# Patient Record
Sex: Male | Born: 1960 | Race: White | Hispanic: No | Marital: Single | Smoking: Never smoker
Health system: Southern US, Community
[De-identification: ages and names within clinical notes are randomized; demographics above are authoritative.]

## PROBLEM LIST (undated history)

## (undated) DIAGNOSIS — I10 Essential (primary) hypertension: Secondary | ICD-10-CM

## (undated) DIAGNOSIS — E119 Type 2 diabetes mellitus without complications: Secondary | ICD-10-CM

---

## 1998-04-26 ENCOUNTER — Encounter: Admission: RE | Admit: 1998-04-26 | Discharge: 1998-04-26 | Payer: Self-pay | Admitting: *Deleted

## 2006-09-11 ENCOUNTER — Ambulatory Visit: Payer: Self-pay | Admitting: Family Medicine

## 2010-01-16 ENCOUNTER — Emergency Department: Payer: Self-pay | Admitting: Emergency Medicine

## 2010-11-27 ENCOUNTER — Emergency Department: Payer: Self-pay | Admitting: Emergency Medicine

## 2011-01-18 ENCOUNTER — Ambulatory Visit: Payer: Self-pay | Admitting: Specialist

## 2011-07-27 ENCOUNTER — Emergency Department: Payer: Self-pay | Admitting: Emergency Medicine

## 2011-07-27 LAB — COMPREHENSIVE METABOLIC PANEL
Alkaline Phosphatase: 83 U/L (ref 50–136)
Anion Gap: 8 (ref 7–16)
BUN: 17 mg/dL (ref 7–18)
Bilirubin,Total: 0.6 mg/dL (ref 0.2–1.0)
Calcium, Total: 8.3 mg/dL — ABNORMAL LOW (ref 8.5–10.1)
Chloride: 104 mmol/L (ref 98–107)
Co2: 27 mmol/L (ref 21–32)
Creatinine: 1.27 mg/dL (ref 0.60–1.30)
Osmolality: 280 (ref 275–301)
SGPT (ALT): 31 U/L
Sodium: 139 mmol/L (ref 136–145)
Total Protein: 7.7 g/dL (ref 6.4–8.2)

## 2011-07-27 LAB — URINALYSIS, COMPLETE
Bacteria: NONE SEEN
Bilirubin,UR: NEGATIVE
Ketone: NEGATIVE
Specific Gravity: 1.027 (ref 1.003–1.030)

## 2011-07-27 LAB — CBC
MCH: 29.1 pg (ref 26.0–34.0)
MCV: 88 fL (ref 80–100)
Platelet: 240 10*3/uL (ref 150–440)
RBC: 5.46 10*6/uL (ref 4.40–5.90)
RDW: 13.5 % (ref 11.5–14.5)
WBC: 6.6 10*3/uL (ref 3.8–10.6)

## 2012-05-19 ENCOUNTER — Emergency Department: Payer: Self-pay | Admitting: Emergency Medicine

## 2015-03-15 ENCOUNTER — Emergency Department: Payer: BLUE CROSS/BLUE SHIELD

## 2015-03-15 ENCOUNTER — Emergency Department
Admission: EM | Admit: 2015-03-15 | Discharge: 2015-03-15 | Disposition: A | Discharge status: Home / Self Care | Payer: BLUE CROSS/BLUE SHIELD | Attending: Emergency Medicine | Admitting: Emergency Medicine

## 2015-03-15 DIAGNOSIS — R109 Unspecified abdominal pain: Secondary | ICD-10-CM

## 2015-03-15 DIAGNOSIS — R112 Nausea with vomiting, unspecified: Secondary | ICD-10-CM | POA: Insufficient documentation

## 2015-03-15 LAB — COMPREHENSIVE METABOLIC PANEL
ALT: 31 U/L (ref 17–63)
ANION GAP: 5 (ref 5–15)
AST: 21 U/L (ref 15–41)
Albumin: 3.8 g/dL (ref 3.5–5.0)
Alkaline Phosphatase: 93 U/L (ref 38–126)
BILIRUBIN TOTAL: 0.3 mg/dL (ref 0.3–1.2)
BUN: 20 mg/dL (ref 6–20)
CO2: 28 mmol/L (ref 22–32)
Calcium: 8.9 mg/dL (ref 8.9–10.3)
Chloride: 100 mmol/L — ABNORMAL LOW (ref 101–111)
Creatinine, Ser: 1.06 mg/dL (ref 0.61–1.24)
GFR calc Af Amer: >60 mL/min (ref >60)
Glucose, Bld: 322 mg/dL — ABNORMAL HIGH (ref 65–99)
POTASSIUM: 4.5 mmol/L (ref 3.5–5.1)
Sodium: 133 mmol/L — ABNORMAL LOW (ref 135–145)
TOTAL PROTEIN: 7.9 g/dL (ref 6.5–8.1)

## 2015-03-15 LAB — URINALYSIS COMPLETE WITH MICROSCOPIC (ARMC ONLY)
BILIRUBIN URINE: NEGATIVE
Bacteria, UA: NONE SEEN
Ketones, ur: NEGATIVE mg/dL
LEUKOCYTES UA: NEGATIVE
Nitrite: NEGATIVE
PH: 5 (ref 5.0–8.0)
Protein, ur: NEGATIVE mg/dL
Specific Gravity, Urine: 1.029 (ref 1.005–1.030)
WBC, UA: NONE SEEN WBC/hpf (ref 0–5)

## 2015-03-15 LAB — CBC WITH DIFFERENTIAL/PLATELET
BASOS PCT: 1 %
Basophils Absolute: 0.1 10*3/uL (ref 0–0.1)
Eosinophils Absolute: 0.5 10*3/uL (ref 0–0.7)
Eosinophils Relative: 4 %
HEMATOCRIT: 49.9 % (ref 40.0–52.0)
Hemoglobin: 16.7 g/dL (ref 13.0–18.0)
Lymphocytes Relative: 21 %
Lymphs Abs: 2.4 10*3/uL (ref 1.0–3.6)
MCH: 29.6 pg (ref 26.0–34.0)
MCHC: 33.5 g/dL (ref 32.0–36.0)
MCV: 88.4 fL (ref 80.0–100.0)
MONO ABS: 0.8 10*3/uL (ref 0.2–1.0)
Monocytes Relative: 7 %
Neutro Abs: 7.6 10*3/uL — ABNORMAL HIGH (ref 1.4–6.5)
Neutrophils Relative %: 67 %
Platelets: 291 10*3/uL (ref 150–440)
RBC: 5.64 MIL/uL (ref 4.40–5.90)
RDW: 13 % (ref 11.5–14.5)
WBC: 11.4 10*3/uL — ABNORMAL HIGH (ref 3.8–10.6)

## 2015-03-15 LAB — GLUCOSE, CAPILLARY: GLUCOSE-CAPILLARY: 254 mg/dL — AB (ref 65–99)

## 2015-03-15 MED ORDER — SODIUM CHLORIDE 0.9 % IV BOLUS (SEPSIS)
1000.0000 mL | Freq: Once | INTRAVENOUS | Status: AC
  Administered 2015-03-15: 1000 mL via INTRAVENOUS

## 2015-03-15 MED ORDER — ONDANSETRON HCL 4 MG/2ML IJ SOLN
4.0000 mg | Freq: Once | INTRAMUSCULAR | Status: AC
  Administered 2015-03-15: 4 mg via INTRAVENOUS
  Filled 2015-03-15: qty 2

## 2015-03-15 MED ORDER — MORPHINE SULFATE (PF) 4 MG/ML IV SOLN
4.0000 mg | Freq: Once | INTRAVENOUS | Status: AC
  Administered 2015-03-15: 4 mg via INTRAVENOUS
  Filled 2015-03-15: qty 1

## 2015-03-15 MED ORDER — KETOROLAC TROMETHAMINE 30 MG/ML IJ SOLN
30.0000 mg | Freq: Once | INTRAMUSCULAR | Status: AC
  Administered 2015-03-15: 30 mg via INTRAVENOUS
  Filled 2015-03-15: qty 1

## 2015-03-15 NOTE — ED Notes (Signed)
Pt in with co left flank pain x 2 days, hx of kidney stones.

## 2015-03-15 NOTE — ED Notes (Signed)
Resumed care from [name]. Pt alert & oriented, resting comfortably. Fluids completed.

## 2015-03-15 NOTE — ED Provider Notes (Signed)
[facility] Emergency Department Provider Note  ____________________________________________  Time seen: Approximately 551 AM  I have reviewed the triage vital signs and the nursing notes.   HISTORY  Chief Complaint Flank Pain    HPI Gavin CornwallBarry D Dorsey is a 54 y.o. male who comes in thinking is a kidney stone. The patient reports that he has had kidney stones twice before. The patient reports he started having some pain on Sunday morning. It was a low dull ache but has been building. The patient has been trying to let it pass and he has passed his previous stones. The patient reports he thinks he passed a small stone yesterday. The pain is been on the left side and seems to wrap around to the front. The patient reports it is just hurting. He has been nauseous with some vomiting and couldn't tolerate the pain anymore. The patient's pain ranges from a 7-9 out of 10 in intensity. The patient denies blood in his urine denies any chest pain, headache, blurred vision. The patient comes in for evaluation. He reports that this does feel like his kidney stones in the past. He has been taking Aleve for the pain. Has not been helping.   Past medical history Kidney stones  There are no active problems to display for this patient.   Past surgical history Carpal tunnel left hand  No current outpatient prescriptions   Allergies Review of patient's allergies indicates no known allergies.  No family history on file.  Social History Social History  Substance Use Topics  . Smoking status:  nonsmoker   . Smokeless tobacco: Not on file  . Alcohol Use:  occasionally drinks     Review of Systems Constitutional: No fever/chills Eyes: No visual changes. ENT: No sore throat. Cardiovascular: Denies chest pain. Respiratory: Denies shortness of breath. Gastrointestinal: Left-sided pain with nausea and vomiting. Genitourinary: Negative for dysuria. Musculoskeletal: Left flank  pain Skin: Negative for rash. Neurological: Negative for headaches, focal weakness or numbness.  10-point ROS otherwise negative.  ____________________________________________   PHYSICAL EXAM:  VITAL SIGNS: ED Triage Vitals  Enc Vitals Group     BP 03/15/15 0433 167/107 mmHg     Pulse Rate 03/15/15 0433 87     Resp 03/15/15 0433 18     Temp 03/15/15 0431 97.8 F (36.6 C)     Temp Source 03/15/15 0431 Oral     SpO2 03/15/15 0433 95 %     Weight 03/15/15 0431 300 lb (136.079 kg)     Height 03/15/15 0431 5\' 8"  (1.727 m)     Head Cir --      Peak Flow --      Pain Score 03/15/15 0432 7     Pain Loc --      Pain Edu? --      Excl. in GC? --     Constitutional: Alert and oriented. Well appearing and in moderate distress. Eyes: Conjunctivae are normal. PERRL. EOMI. Head: Atraumatic. Nose: No congestion/rhinnorhea. Mouth/Throat: Mucous membranes are moist.  Oropharynx non-erythematous. Cardiovascular: Normal rate, regular rhythm. Grossly normal heart sounds.  Good peripheral circulation. Respiratory: Normal respiratory effort.  No retractions. Lungs CTAB. Gastrointestinal: Soft with left side pain. No distention. Positive bowel sounds in left CVA tenderness to palpation Musculoskeletal: No lower extremity tenderness nor edema.   Neurologic:  Normal speech and language.  Skin:  Skin is warm, dry and intact.  Psychiatric: Mood and affect are normal. .  ____________________________________________   LABS (all labs  ordered are listed, but only abnormal results are displayed)  Labs Reviewed  CBC WITH DIFFERENTIAL/PLATELET - Abnormal; Notable for the following:    WBC 11.4 (*)    Neutro Abs 7.6 (*)    All other components within normal limits  COMPREHENSIVE METABOLIC PANEL - Abnormal; Notable for the following:    Sodium 133 (*)    Chloride 100 (*)    Glucose, Bld 322 (*)    All other components within normal limits  URINALYSIS COMPLETEWITH MICROSCOPIC ([facility] ONLY) -  Abnormal; Notable for the following:    Color, Urine YELLOW (*)    APPearance CLEAR (*)    Glucose, UA >500 (*)    Hgb urine dipstick 1+ (*)    Squamous Epithelial / LPF 0-5 (*)    All other components within normal limits   ____________________________________________  EKG  None ____________________________________________  RADIOLOGY  CT renal stone study: No significant abnormality ____________________________________________   PROCEDURES  Procedure(s) performed: None  Critical Care performed: No  ____________________________________________   INITIAL IMPRESSION / ASSESSMENT AND PLAN / ED COURSE  Pertinent labs & imaging results that were available during my care of the patient were reviewed by me and considered in my medical decision making (see chart for details).  This is 54 year old male who comes in today with some left-sided pain with a concern for kidney stone. The patient does not have any blood in his urine but we did do a CT scan that was negative. I did give the patient a liter of normal saline as well as morphine and Zofran and Toradol. I will reassess the patient and likely discharge him to follow-up with his primary care physician.  Patient will be discharged to follow-up with his primary care physician. ____________________________________________   FINAL CLINICAL IMPRESSION(S) / ED DIAGNOSES  Final diagnoses:  Left flank pain      Rebecka Apley, MD 03/15/15 [phone]

## 2015-03-15 NOTE — ED Notes (Signed)
Pt discharged home after verbalizing understanding of discharge instructions; nad noted. 

## 2015-03-15 NOTE — Discharge Instructions (Signed)
Flank Pain °Flank pain refers to pain that is located on the side of the body between the upper abdomen and the back. The pain may occur over a short period of time (acute) or may be long-term or reoccurring (chronic). It may be mild or severe. Flank pain can be caused by many things. °CAUSES  °Some of the more common causes of flank pain include: °· Muscle strains.   °· Muscle spasms.   °· A disease of your spine (vertebral disk disease).   °· A lung infection (pneumonia).   °· Fluid around your lungs (pulmonary edema).   °· A kidney infection.   °· Kidney stones.   °· A very painful skin rash caused by the chickenpox virus (shingles).   °· Gallbladder disease.   °HOME CARE INSTRUCTIONS  °Home care will depend on the cause of your pain. In general, °· Rest as directed by your caregiver. °· Drink enough fluids to keep your urine clear or pale yellow. °· Only take over-the-counter or prescription medicines as directed by your caregiver. Some medicines may help relieve the pain. °· Tell your caregiver about any changes in your pain. °· Follow up with your caregiver as directed. °SEEK IMMEDIATE MEDICAL CARE IF:  °· Your pain is not controlled with medicine.   °· You have new or worsening symptoms. °· Your pain increases.   °· You have abdominal pain.   °· You have shortness of breath.   °· You have persistent nausea or vomiting.   °· You have swelling in your abdomen.   °· You feel faint or pass out.   °· You have blood in your urine. °· You have a fever or persistent symptoms for more than 2-3 days. °· You have a fever and your symptoms suddenly get worse. °MAKE SURE YOU:  °· Understand these instructions. °· Will watch your condition. °· Will get help right away if you are not doing well or get worse. °  °This information is not intended to replace advice given to you by your health care provider. Make sure you discuss any questions you have with your health care provider. °  °Document Released: 05/23/2005 Document  Revised: 12/25/2011 Document Reviewed: 11/14/2011 °Elsevier Interactive Patient Education ©2016 [employer] ° °

## 2016-06-17 ENCOUNTER — Ambulatory Visit: Payer: Managed Care, Other (non HMO) | Admitting: Podiatry

## 2016-07-08 ENCOUNTER — Emergency Department
Admission: EM | Admit: 2016-07-08 | Discharge: 2016-07-08 | Disposition: A | Discharge status: Home / Self Care | Payer: Managed Care, Other (non HMO) | Attending: Student in an Organized Health Care Education/Training Program | Admitting: Student in an Organized Health Care Education/Training Program

## 2016-07-08 ENCOUNTER — Emergency Department: Payer: Managed Care, Other (non HMO)

## 2016-07-08 ENCOUNTER — Encounter: Payer: Self-pay | Admitting: Emergency Medicine

## 2016-07-08 DIAGNOSIS — Z7982 Long term (current) use of aspirin: Secondary | ICD-10-CM | POA: Diagnosis not present

## 2016-07-08 DIAGNOSIS — Z7984 Long term (current) use of oral hypoglycemic drugs: Secondary | ICD-10-CM | POA: Diagnosis not present

## 2016-07-08 DIAGNOSIS — R0602 Shortness of breath: Secondary | ICD-10-CM | POA: Insufficient documentation

## 2016-07-08 DIAGNOSIS — E119 Type 2 diabetes mellitus without complications: Secondary | ICD-10-CM | POA: Insufficient documentation

## 2016-07-08 DIAGNOSIS — R531 Weakness: Secondary | ICD-10-CM

## 2016-07-08 HISTORY — DX: Type 2 diabetes mellitus without complications: E11.9

## 2016-07-08 LAB — BASIC METABOLIC PANEL
Anion gap: 9 (ref 5–15)
BUN: 18 mg/dL (ref 6–20)
CALCIUM: 9.2 mg/dL (ref 8.9–10.3)
CO2: 24 mmol/L (ref 22–32)
CREATININE: 1.11 mg/dL (ref 0.61–1.24)
Chloride: 100 mmol/L — ABNORMAL LOW (ref 101–111)
GLUCOSE: 245 mg/dL — AB (ref 65–99)
Potassium: 4.3 mmol/L (ref 3.5–5.1)
SODIUM: 133 mmol/L — AB (ref 135–145)

## 2016-07-08 LAB — CBC
HCT: 48.3 % (ref 40.0–52.0)
Hemoglobin: 17 g/dL (ref 13.0–18.0)
MCH: 30.2 pg (ref 26.0–34.0)
MCHC: 35.1 g/dL (ref 32.0–36.0)
MCV: 86.1 fL (ref 80.0–100.0)
PLATELETS: 306 10*3/uL (ref 150–440)
RBC: 5.61 MIL/uL (ref 4.40–5.90)
RDW: 13.5 % (ref 11.5–14.5)
WBC: 11.1 10*3/uL — ABNORMAL HIGH (ref 3.8–10.6)

## 2016-07-08 LAB — FIBRIN DERIVATIVES D-DIMER (ARMC ONLY): Fibrin derivatives D-dimer (ARMC): 419.99 (ref 0.00–499.00)

## 2016-07-08 LAB — BRAIN NATRIURETIC PEPTIDE: B Natriuretic Peptide: 31 pg/mL (ref 0.0–100.0)

## 2016-07-08 LAB — TROPONIN I: Troponin I: 0.03 ng/mL (ref <0.03)

## 2016-07-08 MED ORDER — ALBUTEROL SULFATE HFA 108 (90 BASE) MCG/ACT IN AERS: 2.0000 | INHALATION_SPRAY | Freq: Four times a day (QID) | RESPIRATORY_TRACT | 2 refills | Status: DC | PRN

## 2016-07-08 NOTE — ED Notes (Signed)
Katie RN notified of critical trop of 0.03

## 2016-07-08 NOTE — Discharge Instructions (Signed)
Follow up with cardiology clinic as soon as possible.  Return to ER for any chest pain, pressure or worsening shortness of breath.

## 2016-07-08 NOTE — ED Provider Notes (Signed)
[facility] Emergency Department Provider Note    First MD Initiated Contact with Patient 07/08/16 1300     (approximate)  I have reviewed the triage vital signs and the nursing notes.   HISTORY  Chief Complaint Weakness    HPI Gavin Dorsey is a 56 y.o. male history diabetes presents with several weeks of persistent weakness after being treated for pneumonia 3 times in the past several weeks.He's had worsening fatigue and weakness. He went to see his primary care physician this morning who did an EKG and due to an abnormality read on the EKG sent to the patient to the ER for further evaluation. Patient denies any chest pain. States that shortness of breath cough and symptoms of pneumonia have improved over the past several days. Denies any fevers.   Past Medical History:  Diagnosis Date  . Diabetes mellitus without complication (HCC)    No family history on file. History reviewed. No pertinent surgical history. There are no active problems to display for this patient.     Prior to Admission medications   Medication Sig Start Date End Date Taking? Authorizing Provider  aspirin 81 MG chewable tablet Chew by mouth daily.   Yes Historical Provider, MD  metFORMIN (GLUCOPHAGE) 1000 MG tablet Take 1,000 mg by mouth 2 (two) times daily.    Yes Historical Provider, MD  Multiple Vitamin (MULTIVITAMIN WITH MINERALS) TABS tablet Take 1 tablet by mouth daily.   Yes Historical Provider, MD    Allergies Patient has no known allergies.    Social History Social History  Substance Use Topics  . Smoking status: Never Smoker  . Smokeless tobacco: Never Used  . Alcohol use Yes    Review of Systems Patient denies headaches, rhinorrhea, blurry vision, numbness, shortness of breath, chest pain, edema, cough, abdominal pain, nausea, vomiting, diarrhea, dysuria, fevers, rashes or hallucinations unless otherwise stated above in  HPI. ____________________________________________   PHYSICAL EXAM:  VITAL SIGNS: Vitals:   07/08/16 1134  BP: (!) 151/102  Pulse: (!) 102  Resp: 20  Temp: 98.6 F (37 C)    Constitutional: Alert and oriented. Well appearing and in no acute distress. Eyes: Conjunctivae are normal. PERRL. EOMI. Head: Atraumatic. Nose: No congestion/rhinnorhea. Mouth/Throat: Mucous membranes are moist.  Oropharynx non-erythematous. Neck: No stridor. Painless ROM. No cervical spine tenderness to palpation Hematological/Lymphatic/Immunilogical: No cervical lymphadenopathy. Cardiovascular: Normal rate, regular rhythm. Grossly normal heart sounds.  Good peripheral circulation. Respiratory: Normal respiratory effort.  No retractions. Lungs CTAB. Gastrointestinal: Soft and nontender. No distention. No abdominal bruits. No CVA tenderness. Genitourinary:  Musculoskeletal: No lower extremity tenderness nor edema.  No joint effusions. Neurologic:  Normal speech and language. No gross focal neurologic deficits are appreciated. No gait instability. Skin:  Skin is warm, dry and intact. No rash noted. Psychiatric: Mood and affect are normal. Speech and behavior are normal.  ____________________________________________   LABS (all labs ordered are listed, but only abnormal results are displayed)  Results for orders placed or performed during the hospital encounter of 07/08/16 (from the past 24 hour(s))  Basic metabolic panel     Status: Abnormal   Collection Time: 07/08/16 11:51 AM  Result Value Ref Range   Sodium 133 (L) 135 - 145 mmol/L   Potassium 4.3 3.5 - 5.1 mmol/L   Chloride 100 (L) 101 - 111 mmol/L   CO2 24 22 - 32 mmol/L   Glucose, Bld 245 (H) 65 - 99 mg/dL   BUN 18 6 - 20  mg/dL   Creatinine, Ser 1.61 0.61 - 1.24 mg/dL   Calcium 9.2 8.9 - 09.6 mg/dL   GFR calc non Af Amer >60 >60 mL/min   GFR calc Af Amer >60 >60 mL/min   Anion gap 9 5 - 15  CBC     Status: Abnormal   Collection Time:  07/08/16 11:51 AM  Result Value Ref Range   WBC 11.1 (H) 3.8 - 10.6 K/uL   RBC 5.61 4.40 - 5.90 MIL/uL   Hemoglobin 17.0 13.0 - 18.0 g/dL   HCT 04.5 40.9 - 81.1 %   MCV 86.1 80.0 - 100.0 fL   MCH 30.2 26.0 - 34.0 pg   MCHC 35.1 32.0 - 36.0 g/dL   RDW 91.4 78.2 - 95.6 %   Platelets 306 150 - 440 K/uL  Troponin I     Status: Abnormal   Collection Time: 07/08/16 11:51 AM  Result Value Ref Range   Troponin I 0.03 (HH) <0.03 ng/mL   ____________________________________________  EKG My review and personal interpretation at Time: 11:30   Indication: weakness  Rate: 98  Rhythm: sinus Axis: normal Other: poor r wave progression, normal intervals ____________________________________________  RADIOLOGY  I personally reviewed all radiographic images ordered to evaluate for the above acute complaints and reviewed radiology reports and findings.  These findings were personally discussed with the patient.  Please see medical record for radiology report.  ____________________________________________   PROCEDURES  Procedure(s) performed:  Procedures    Critical Care performed: no ____________________________________________   INITIAL IMPRESSION / ASSESSMENT AND PLAN / ED COURSE  Pertinent labs & imaging results that were available during my care of the patient were reviewed by me and considered in my medical decision making (see chart for details).  DDX: ACS, ischemia, pneumonia, PE, congestive heart failure  Gavin Dorsey is a 56 y.o. who presents to the ED with above complaints. He arrives afebrile hemodynamically stable. EKG shows no evidence of acute ischemia. His troponin is normal at 0.03. Patient's presentation is not consistent with ACS as he otherwise denies any symptoms of chest pain. Patient does have some delayed transition of the R-wave but no evidence of ST elevation, abnormal intervals or heart strain. No evidence of pericarditis. Chest x-ray without any evidence of  edema. No evidence of consolidation. Patient is low risk by well's therefore a d-dimer was ordered to further risk stratify for pulmonary embolism and is negative.  Have discussed with the patient and available family all diagnostics and treatments performed thus far and all questions were answered to the best of my ability. The patient demonstrates understanding and agreement with plan.    Clinical Course as of Jul 08 1520  Mon Jul 08, 2016  1346 HCT: 48.3 [PR]    Clinical Course User Index [PR] Willy Eddy, MD     ____________________________________________   FINAL CLINICAL IMPRESSION(S) / ED DIAGNOSES  Final diagnoses:  Weakness      NEW MEDICATIONS STARTED DURING THIS VISIT:  New Prescriptions   No medications on file     Note:  This document was prepared using Dragon voice recognition software and may include unintentional dictation errors.    Willy Eddy, MD 07/08/16 [phone]

## 2016-07-08 NOTE — ED Triage Notes (Signed)
Says being treated for pneumonia x 3 at pcp.  Tried to go to work last Wednesday and became diaphoretic and weak.  Went to his pcp and they did ekg and sent him here.

## 2016-07-19 ENCOUNTER — Encounter: Payer: Self-pay | Admitting: Emergency Medicine

## 2016-07-19 ENCOUNTER — Emergency Department
Admission: EM | Admit: 2016-07-19 | Discharge: 2016-07-19 | Disposition: A | Discharge status: Home / Self Care | Payer: Managed Care, Other (non HMO) | Attending: Emergency Medicine | Admitting: Emergency Medicine

## 2016-07-19 DIAGNOSIS — Z7984 Long term (current) use of oral hypoglycemic drugs: Secondary | ICD-10-CM | POA: Diagnosis not present

## 2016-07-19 DIAGNOSIS — E119 Type 2 diabetes mellitus without complications: Secondary | ICD-10-CM | POA: Insufficient documentation

## 2016-07-19 DIAGNOSIS — Z7982 Long term (current) use of aspirin: Secondary | ICD-10-CM | POA: Insufficient documentation

## 2016-07-19 DIAGNOSIS — I10 Essential (primary) hypertension: Secondary | ICD-10-CM | POA: Diagnosis not present

## 2016-07-19 DIAGNOSIS — R51 Headache: Secondary | ICD-10-CM | POA: Diagnosis present

## 2016-07-19 LAB — BASIC METABOLIC PANEL
Anion gap: 9 (ref 5–15)
BUN: 28 mg/dL — ABNORMAL HIGH (ref 6–20)
CALCIUM: 9.1 mg/dL (ref 8.9–10.3)
CHLORIDE: 99 mmol/L — AB (ref 101–111)
CO2: 25 mmol/L (ref 22–32)
CREATININE: 1.33 mg/dL — AB (ref 0.61–1.24)
GFR calc Af Amer: >60 mL/min (ref >60)
GFR, EST NON AFRICAN AMERICAN: 59 mL/min — AB (ref >60)
Glucose, Bld: 218 mg/dL — ABNORMAL HIGH (ref 65–99)
Potassium: 3.8 mmol/L (ref 3.5–5.1)
Sodium: 133 mmol/L — ABNORMAL LOW (ref 135–145)

## 2016-07-19 LAB — CBC
HCT: 46.1 % (ref 40.0–52.0)
Hemoglobin: 15.9 g/dL (ref 13.0–18.0)
MCH: 30.3 pg (ref 26.0–34.0)
MCHC: 34.6 g/dL (ref 32.0–36.0)
MCV: 87.7 fL (ref 80.0–100.0)
PLATELETS: 328 10*3/uL (ref 150–440)
RBC: 5.26 MIL/uL (ref 4.40–5.90)
RDW: 14 % (ref 11.5–14.5)
WBC: 12.2 10*3/uL — AB (ref 3.8–10.6)

## 2016-07-19 LAB — TROPONIN I

## 2016-07-19 MED ORDER — ATENOLOL 25 MG PO TABS: 25.0000 mg | ORAL_TABLET | Freq: Every day | ORAL | 0 refills | Status: DC

## 2016-07-19 MED ORDER — ATENOLOL 25 MG PO TABS
25.0000 mg | ORAL_TABLET | Freq: Once | ORAL | Status: AC
  Administered 2016-07-19: 25 mg via ORAL
  Filled 2016-07-19: qty 1

## 2016-07-19 NOTE — ED Notes (Signed)
Pt discharged to home.  Discharge instructions reviewed.  Verbalized understanding.  No questions or concerns at this time.  Teach back verified.  Pt in NAD.  No items left in ED.   

## 2016-07-19 NOTE — ED Triage Notes (Signed)
Pt ambulatory to triage in NAD, reports elevated BP at home, 160s SBP, reports normally runs in 130s.  Denies current BP meds.  Pt reports very mild headache, reports just not feeling quite right.

## 2016-07-19 NOTE — Discharge Instructions (Signed)
1. Start atenolol 25 mg daily for your blood pressure. 2. Record a log of your blood pressures morning, noon and night. Take this to your doctor next week when you follow-up. 3. Return to the ER for worsening symptoms, persistent vomiting, difficulty breathing or other concerns.

## 2016-07-19 NOTE — ED Provider Notes (Signed)
[facility] Emergency Department Provider Note   ____________________________________________   First MD Initiated Contact with Patient 07/19/16 [phone]     (approximate)  I have reviewed the triage vital signs and the nursing notes.   HISTORY  Chief Complaint Hypertension    HPI Gavin Dorsey is a 56 y.o. male who presents to the ED from work with a chief complaint of elevated blood pressure. Patient has a history of diabetes but denies history of hypertension. States his blood pressure has been elevated for the past 2 weeks. 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 Since that time he has had 3 bouts of pneumonia and now his blood pressures are elevated. Reports baseline SBP 130s. Complains only of a gradual onset, mild global headache which has since resolved. Denies vision changes, neck pain, chest pain, shortness of breath, abdominal pain, nausea, vomiting, diarrhea. Denies recent travel or trauma. Nothing makes his symptoms better or worse.   Past Medical History:  Diagnosis Date  . Diabetes mellitus without complication (HCC)     There are no active problems to display for this patient.   History reviewed. No pertinent surgical history.  Prior to Admission medications   Medication Sig Start Date End Date Taking? Authorizing Provider  albuterol (PROVENTIL HFA;VENTOLIN HFA) 108 (90 Base) MCG/ACT inhaler Inhale 2 puffs into the lungs every 6 (six) hours as needed for wheezing or shortness of breath. 07/08/16   Willy Eddy, MD  aspirin 81 MG chewable tablet Chew by mouth daily.    Historical Provider, MD  atenolol (TENORMIN) 25 MG tablet Take 1 tablet (25 mg total) by mouth daily. 07/19/16 07/19/17  Irean Hong, MD  metFORMIN (GLUCOPHAGE) 1000 MG tablet Take 1,000 mg by mouth 2 (two) times daily.     Historical Provider, MD  Multiple Vitamin (MULTIVITAMIN WITH MINERALS) TABS tablet Take 1 tablet by mouth daily.    Historical Provider, MD      Allergies Patient has no known allergies.  Family History Hypertension  Social History Social History  Substance Use Topics  . Smoking status: Never Smoker  . Smokeless tobacco: Never Used  . Alcohol use Yes    Review of Systems  Constitutional: No fever/chills. Eyes: No visual changes. ENT: No sore throat. Cardiovascular: Denies chest pain. Respiratory: Denies shortness of breath. Gastrointestinal: No abdominal pain.  No nausea, no vomiting.  No diarrhea.  No constipation. Genitourinary: Negative for dysuria. Musculoskeletal: Negative for back pain. Skin: Negative for rash. Neurological: Positive for headache. Negative for focal weakness or numbness.  10-point ROS otherwise negative.  ____________________________________________   PHYSICAL EXAM:  VITAL SIGNS: ED Triage Vitals [07/19/16 0215]  Enc Vitals Group     BP (!) 166/99     Pulse Rate 99     Resp 18     Temp 97.6 F (36.4 C)     Temp Source Oral     SpO2 95 %     Weight 295 lb (133.8 kg)     Height  (1.727 m)     Head Circumference      Peak Flow      Pain Score 0     Pain Loc      Pain Edu?      Excl. in GC?      Constitutional: Alert and oriented. Well appearing and in no acute distress. Eyes: Conjunctivae are normal. PERRL. EOMI. Head: Atraumatic. Nose: No congestion/rhinnorhea. Mouth/Throat: Mucous membranes are moist.  Oropharynx non-erythematous.  Neck: No stridor.  No carotid bruits. Cardiovascular: Normal rate, regular rhythm. Grossly normal heart sounds.  Good peripheral circulation. Respiratory: Normal respiratory effort.  No retractions. Lungs CTAB. Gastrointestinal: Soft and nontender. No distention. No abdominal bruits. No CVA tenderness. Musculoskeletal: No lower extremity tenderness nor edema.  No joint effusions. Neurologic:  Normal speech and language. No gross focal neurologic deficits are appreciated. No gait instability. Skin:  Skin is warm, dry and intact. No  rash noted. Psychiatric: Mood and affect are normal. Speech and behavior are normal.  ____________________________________________   LABS (all labs ordered are listed, but only abnormal results are displayed)  Labs Reviewed  BASIC METABOLIC PANEL - Abnormal; Notable for the following:       Result Value   Sodium 133 (*)    Chloride 99 (*)    Glucose, Bld 218 (*)    BUN 28 (*)    Creatinine, Ser 1.33 (*)    GFR calc non Af Amer 59 (*)    All other components within normal limits  CBC - Abnormal; Notable for the following:    WBC 12.2 (*)    All other components within normal limits  TROPONIN I   ____________________________________________  EKG  ED ECG REPORT I, Everard Interrante J, the attending physician, personally viewed and interpreted this ECG.   Date: 07/19/2016  EKG Time: 0220  Rate: 98  Rhythm: normal EKG, normal sinus rhythm  Axis: LAD  Intervals:none  ST&T Change: Nonspecific  ____________________________________________  RADIOLOGY  None ____________________________________________   PROCEDURES  Procedure(s) performed: None  Procedures  Critical Care performed: No  ____________________________________________   INITIAL IMPRESSION / ASSESSMENT AND PLAN / ED COURSE  Pertinent labs & imaging results that were available during my care of the patient were reviewed by me and considered in my medical decision making (see chart for details).  56 year old male with a history of diabetes who presents with elevated blood pressure. Laboratory results remarkable for mild renal insufficiency. Patient admits he does not hydrate with water; drinks multiple sodas per day. Chart review reveals patient was seen 2 weeks ago for weakness. His diastolic blood pressure at that time was greater than 100. In 06/2015 his diastolic blood pressure was greater than 100 during a visit to [facility]. Given his medical comorbidities, as well as family history and several documented elevated  blood pressures, I will go ahead and start the patient on low-dose atenolol. Encouraged patient to log his blood pressure readings to take to his PCP when he follows up. Strict return precautions given. Patient verbalizes understanding and agrees with plan of care.      ____________________________________________   FINAL CLINICAL IMPRESSION(S) / ED DIAGNOSES  Final diagnoses:  Essential hypertension      NEW MEDICATIONS STARTED DURING THIS VISIT:  New Prescriptions   ATENOLOL (TENORMIN) 25 MG TABLET    Take 1 tablet (25 mg total) by mouth daily.     Note:  This document was prepared using Dragon voice recognition software and may include unintentional dictation errors.    Irean Hong, MD 07/19/16 [phone]

## 2017-07-16 IMAGING — CT CT RENAL STONE PROTOCOL
1 of 2 series · 16 of 32 positions shown, 20 images · non-contrast
Comparison: None.

CLINICAL DATA: Left flank pain for 2 days.

EXAM:
CT ABDOMEN AND PELVIS WITHOUT CONTRAST
TECHNIQUE: Multidetector CT imaging of the abdomen and pelvis was performed
following the standard protocol without IV contrast.

[Series 2: stone standard full · axial · 0.97mm/px · z∈[-616,-150]mm · 16 of 103 slices shown, 20 images]
[im 5/103  soft-tissue]
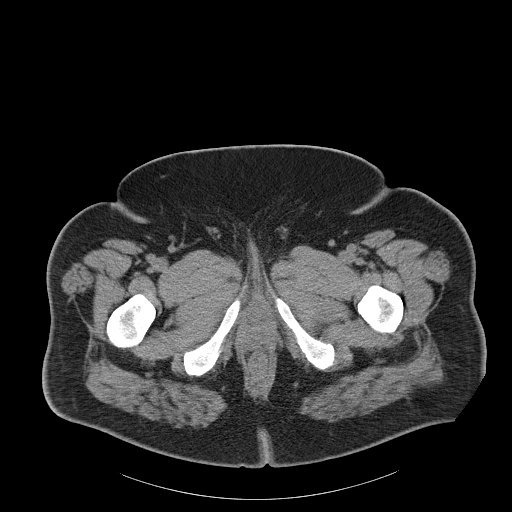
[im 5/103  bone]
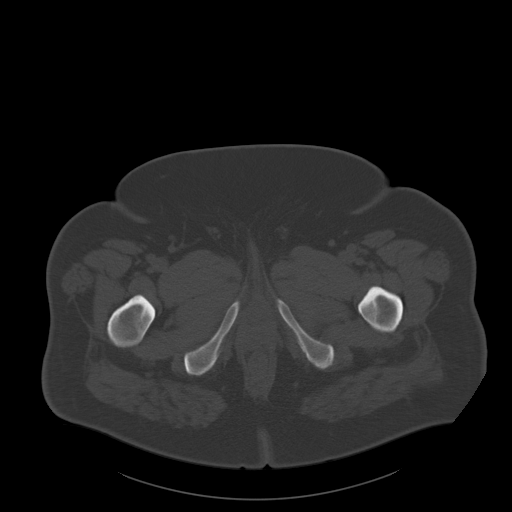
[im 13/103  soft-tissue]
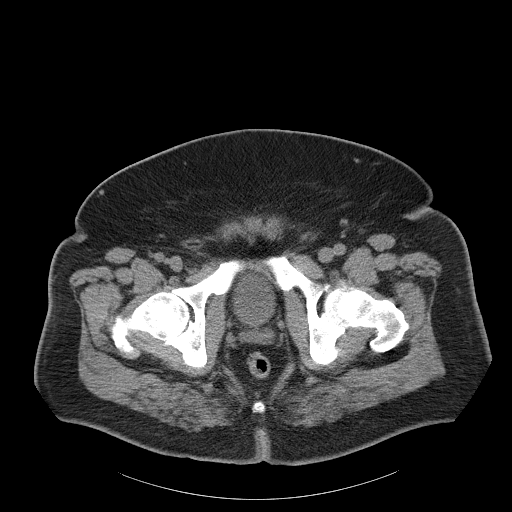
[im 22/103  soft-tissue]
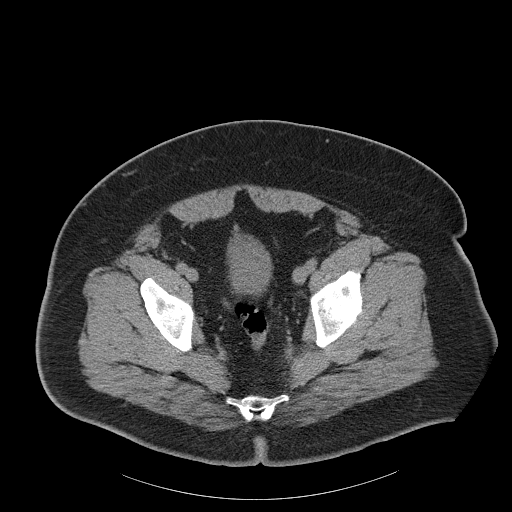
[im 26/103  soft-tissue]
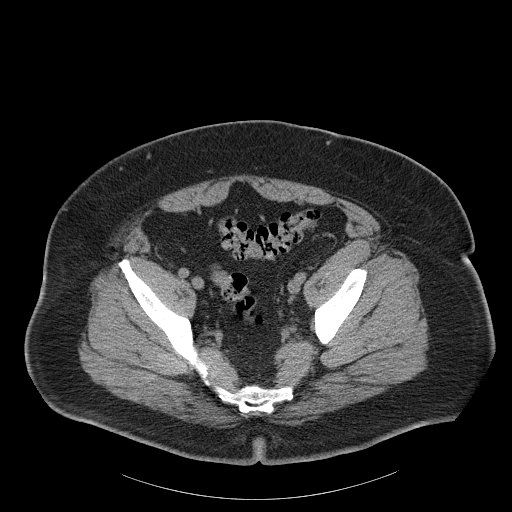
[im 35/103  soft-tissue]
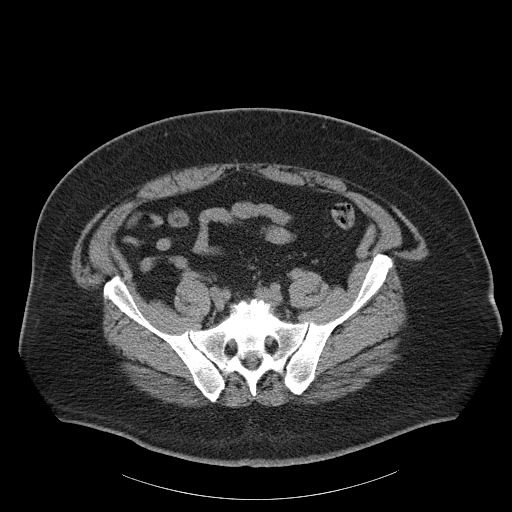
[im 43/103  soft-tissue]
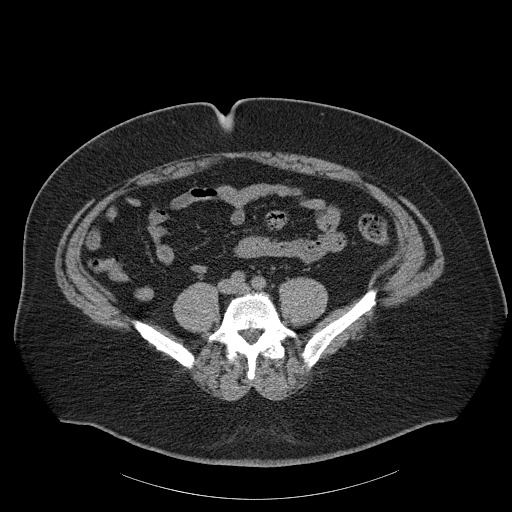
[im 47/103  soft-tissue]
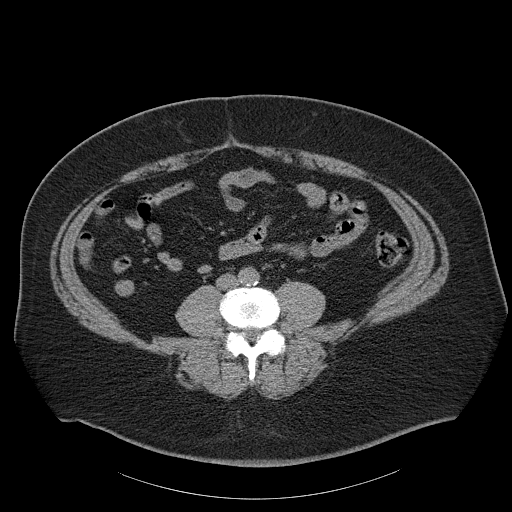
[im 56/103  soft-tissue]
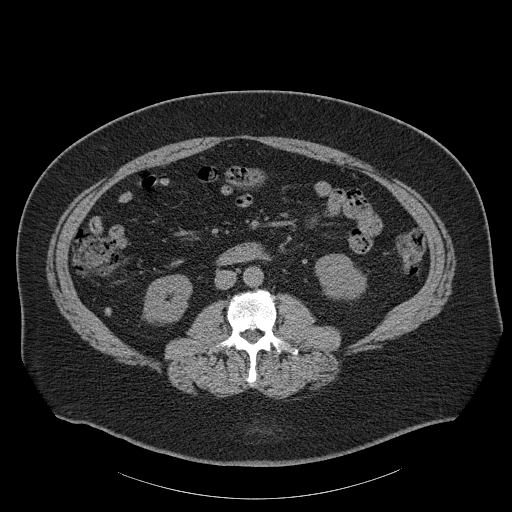
[im 60/103  soft-tissue]
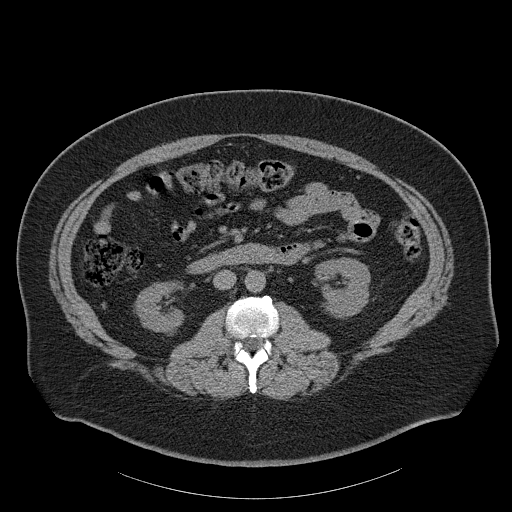
[im 60/103  bone]
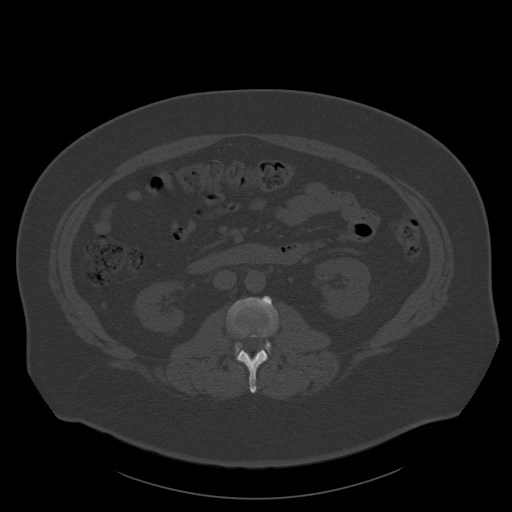
[im 69/103  soft-tissue]
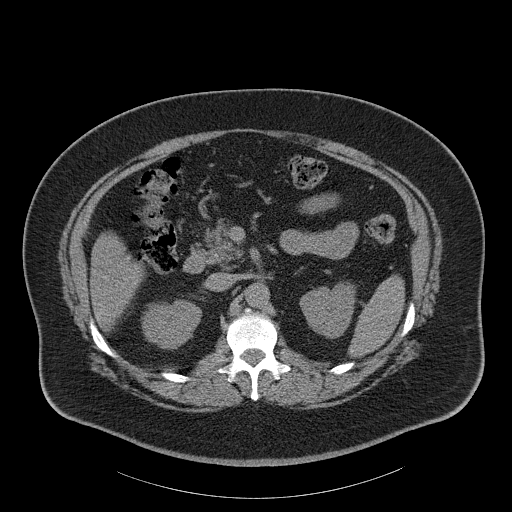
[im 77/103  soft-tissue]
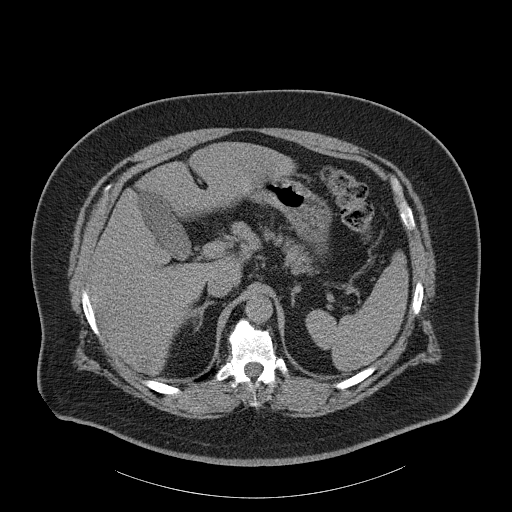
[im 81/103  soft-tissue]
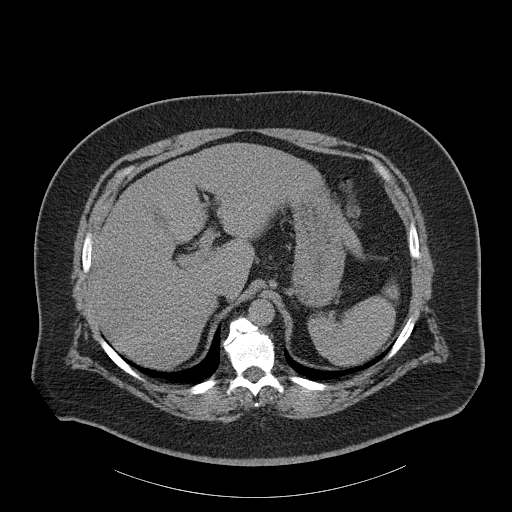
[im 86/103  lung]
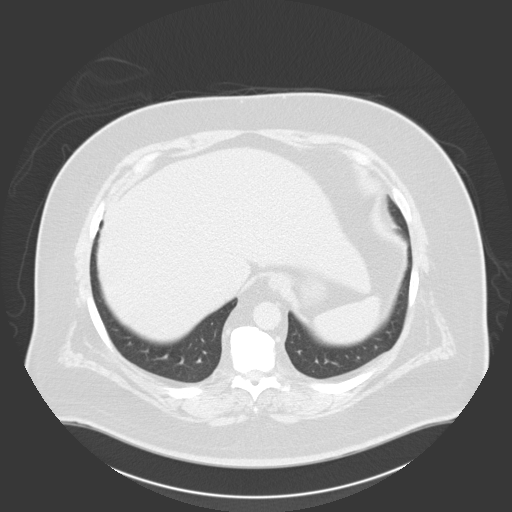
[im 90/103  soft-tissue]
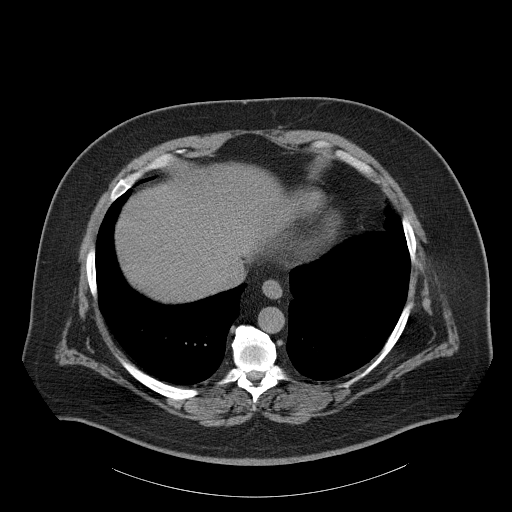
[im 90/103  lung]
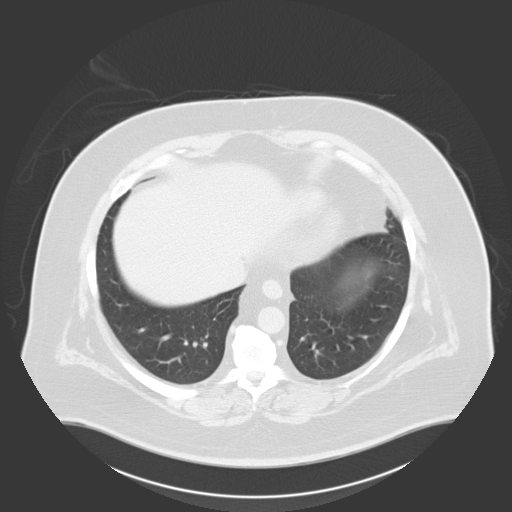
[im 94/103  lung]
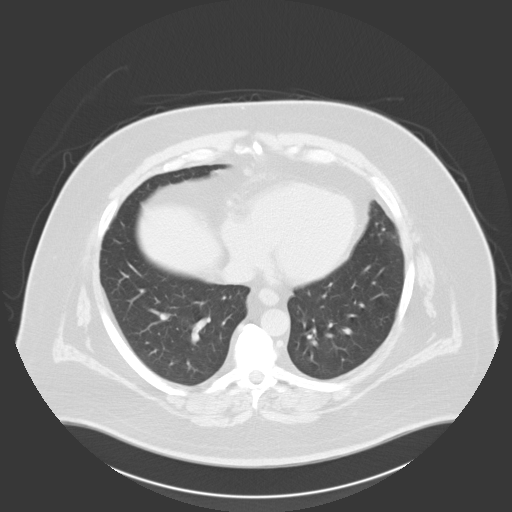
[im 98/103  soft-tissue]
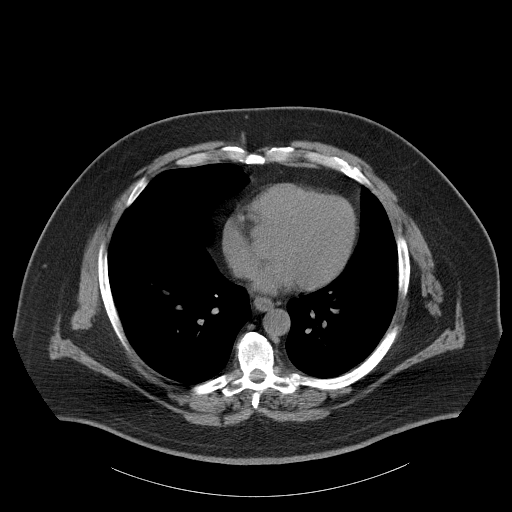
[im 98/103  lung]
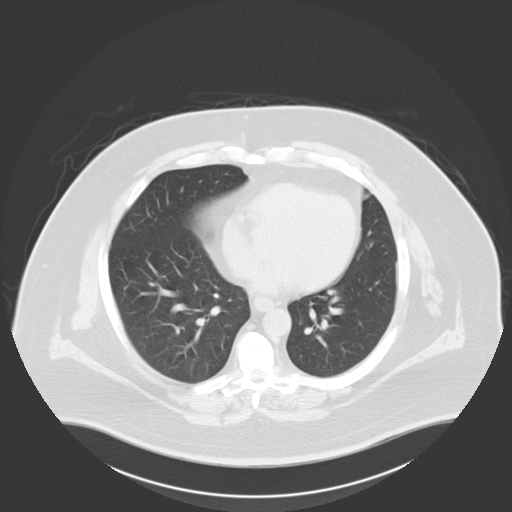

[16 of 32 positions shown; findings below may reference images not displayed]

FINDINGS: There are no urinary calculi. There is no hydronephrosis or ureteral
dilatation. There are unremarkable unenhanced appearances of the
liver, gallbladder, spleen, pancreas, adrenals and kidneys. The
abdominal aorta is normal in caliber. There is mild atherosclerotic
calcification. There is no adenopathy in the abdomen or pelvis.
There are normal appearances of the stomach, small bowel and colon.
The appendix is normal.

No acute inflammatory changes are evident in the abdomen or pelvis.
There is no ascites. There is no significant musculoskeletal lesion.
There is no significant abnormality in the lower chest.
IMPRESSION: No significant abnormality.

## 2018-11-09 IMAGING — CR DG CHEST 2V
1 series · 2 of 2 positions shown · non-contrast
Comparison: Chest CT 09/11/2006

CLINICAL DATA: Recent treatment for pneumonia.

EXAM:
CHEST  2 VIEW

[Series 1: dg chest 2 view · 0.14mm/px · 2 of 2 slices shown]
[im 1/2]
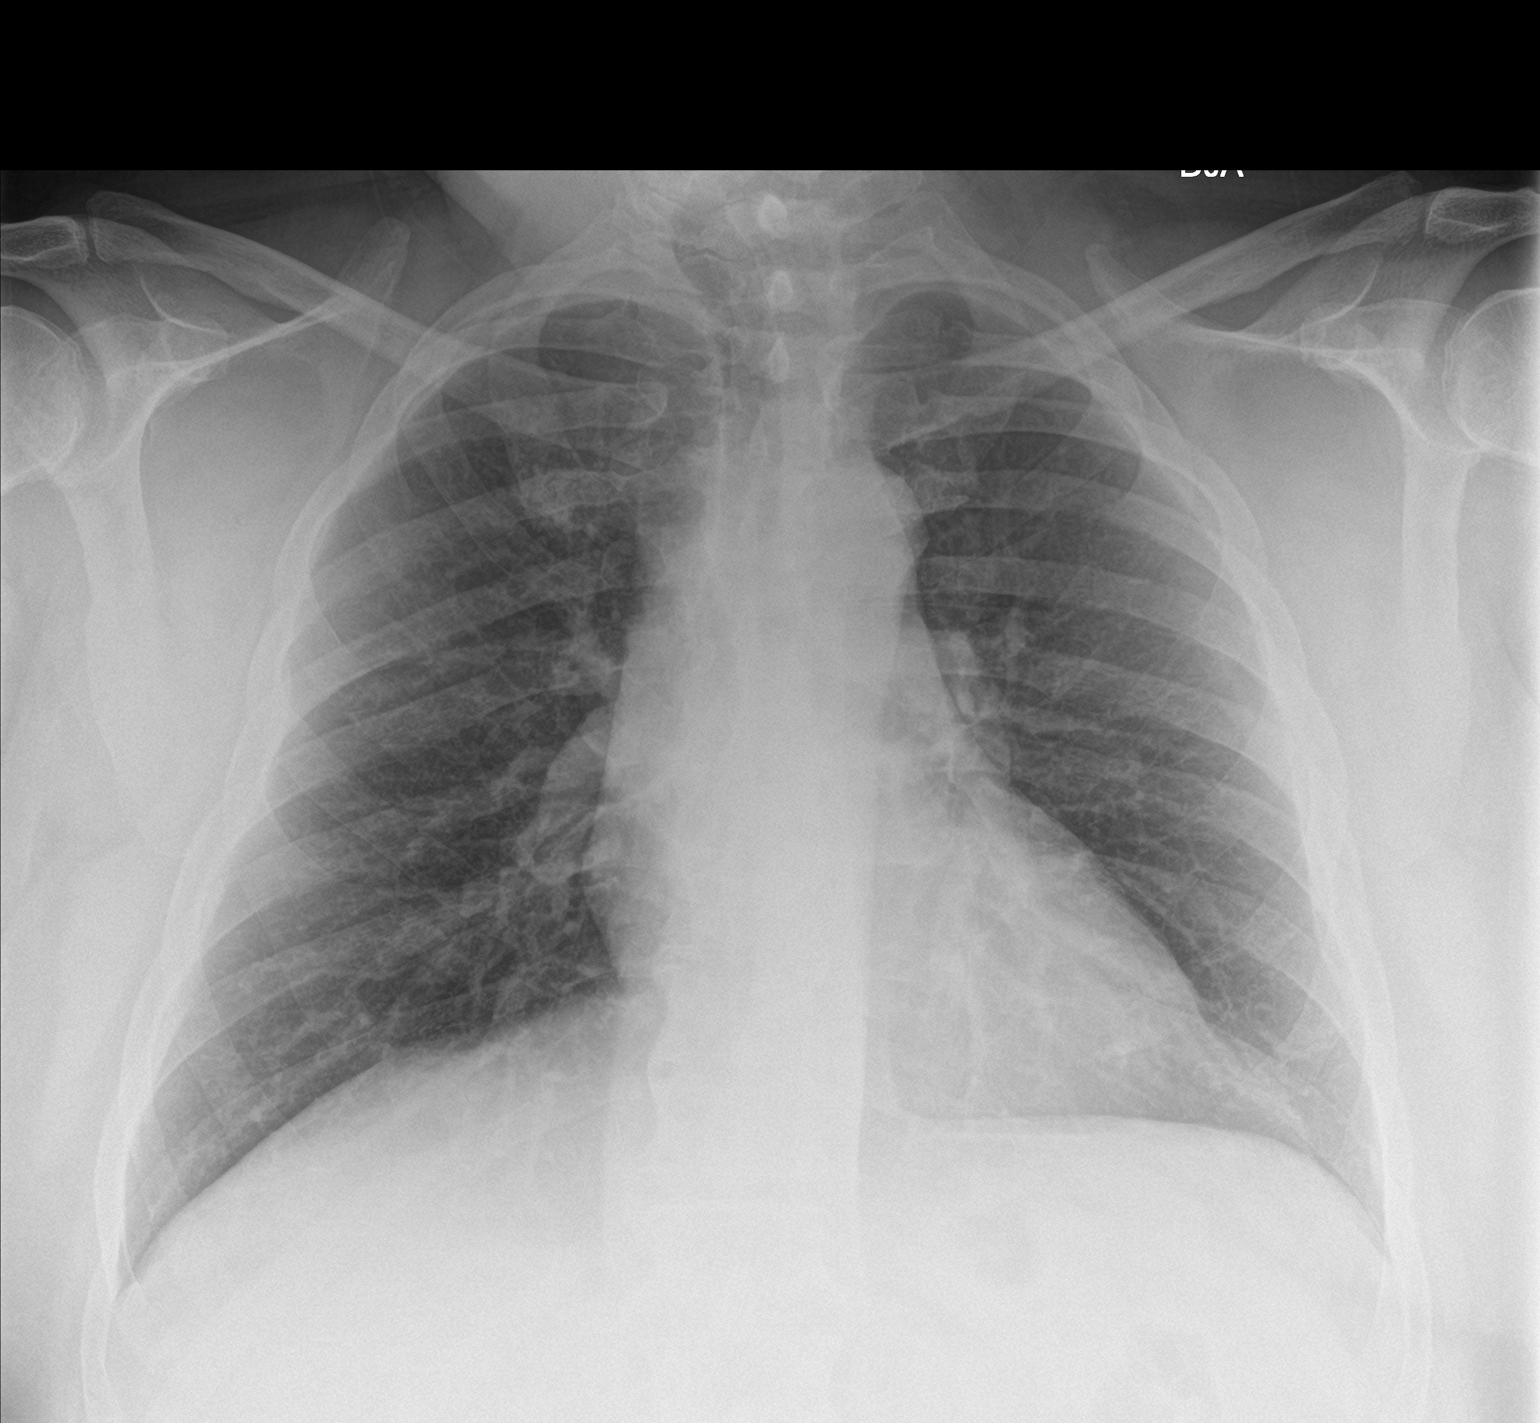
[im 2/2]
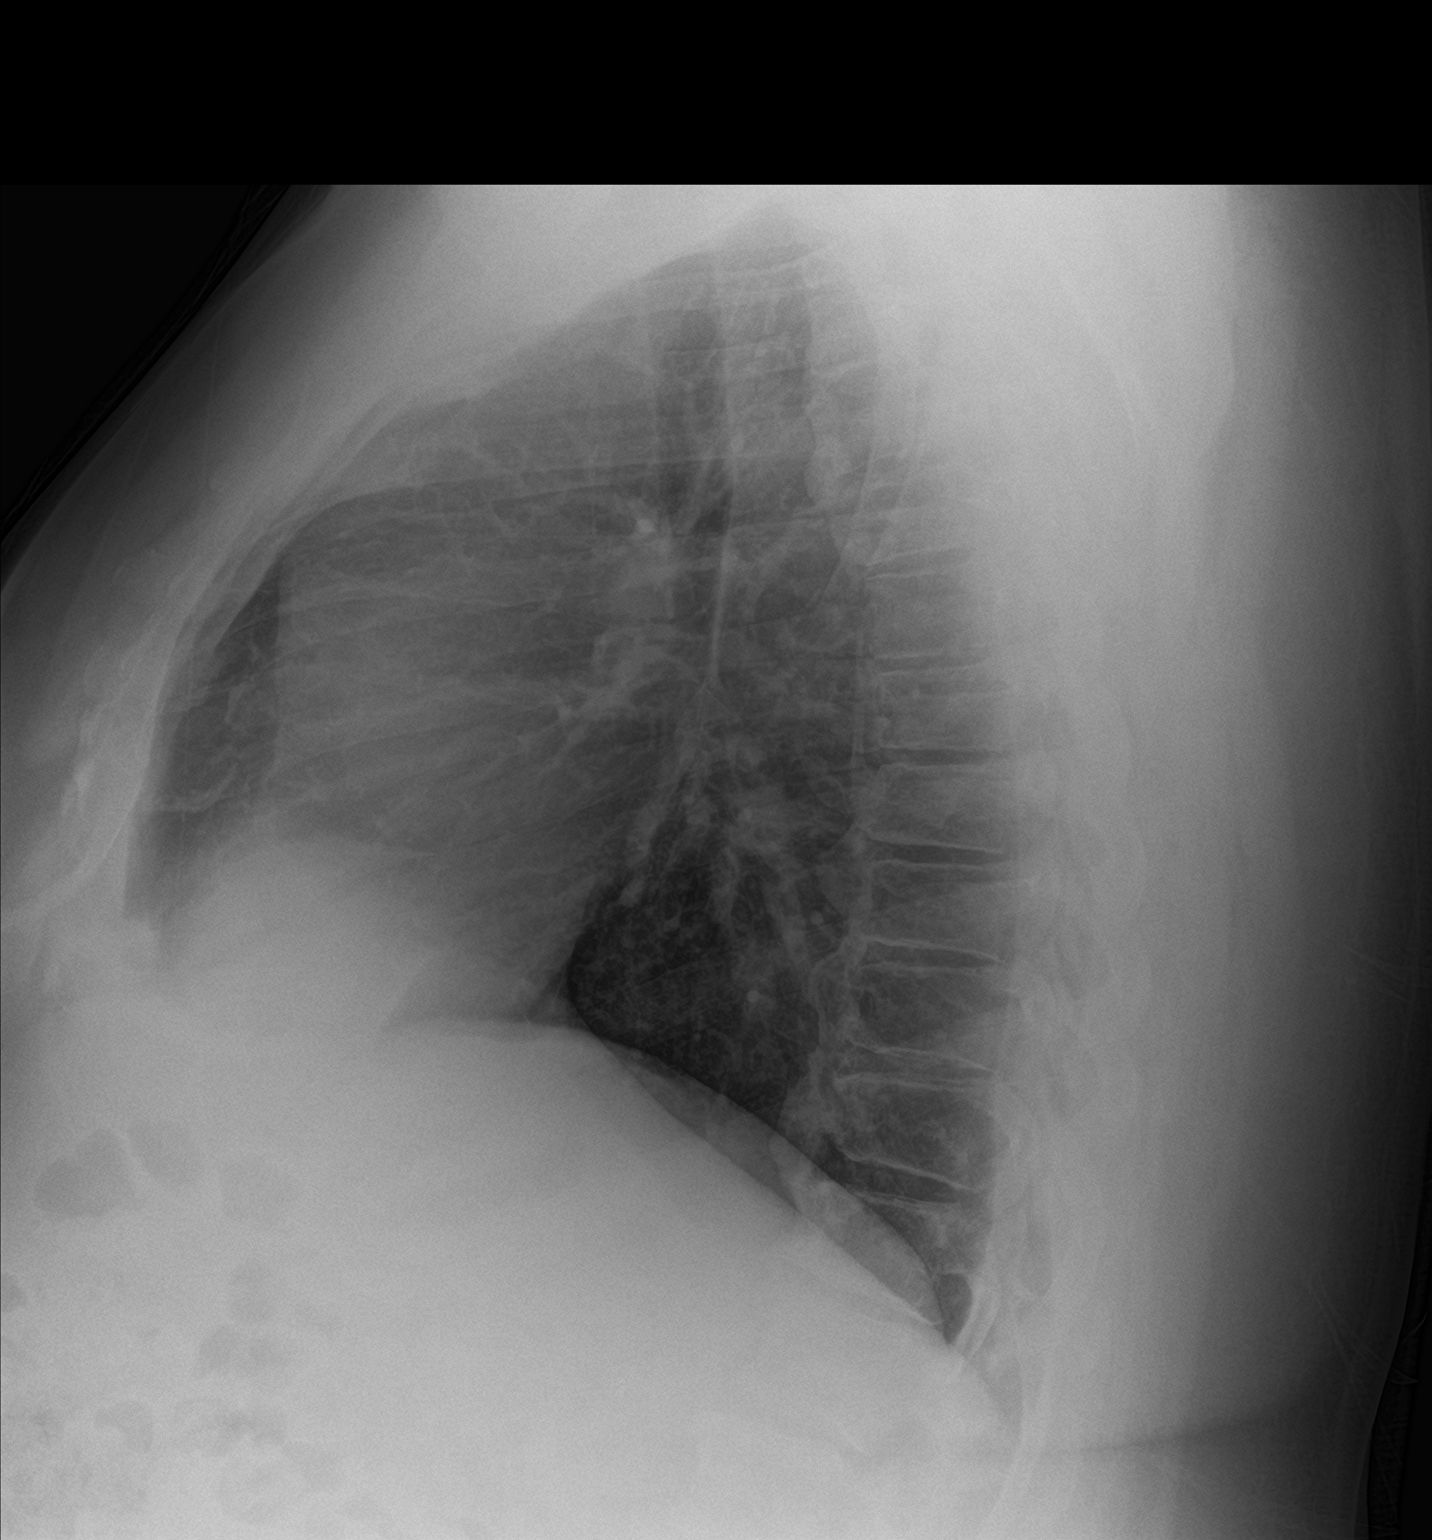

[2 of 2 positions shown; findings below may reference images not displayed]

FINDINGS: Heart and mediastinal contours are within normal limits. No focal
opacities or effusions. No acute bony abnormality.
IMPRESSION: No active cardiopulmonary disease.

## 2019-02-22 ENCOUNTER — Other Ambulatory Visit: Payer: Self-pay

## 2019-02-22 DIAGNOSIS — Z20822 Contact with and (suspected) exposure to covid-19: Secondary | ICD-10-CM

## 2019-02-23 LAB — NOVEL CORONAVIRUS, NAA: SARS-CoV-2, NAA: NOT DETECTED

## 2021-11-12 ENCOUNTER — Other Ambulatory Visit: Payer: Self-pay

## 2021-11-12 ENCOUNTER — Emergency Department
Admission: EM | Admit: 2021-11-12 | Discharge: 2021-11-12 | Disposition: A | Discharge status: Home / Self Care | Payer: BC Managed Care – PPO | Attending: Emergency Medicine | Admitting: Emergency Medicine

## 2021-11-12 ENCOUNTER — Emergency Department: Payer: BC Managed Care – PPO

## 2021-11-12 ENCOUNTER — Encounter: Payer: Self-pay | Admitting: Emergency Medicine

## 2021-11-12 DIAGNOSIS — N200 Calculus of kidney: Secondary | ICD-10-CM | POA: Diagnosis not present

## 2021-11-12 DIAGNOSIS — E119 Type 2 diabetes mellitus without complications: Secondary | ICD-10-CM | POA: Insufficient documentation

## 2021-11-12 DIAGNOSIS — R109 Unspecified abdominal pain: Secondary | ICD-10-CM

## 2021-11-12 DIAGNOSIS — E860 Dehydration: Secondary | ICD-10-CM | POA: Insufficient documentation

## 2021-11-12 DIAGNOSIS — I10 Essential (primary) hypertension: Secondary | ICD-10-CM | POA: Diagnosis not present

## 2021-11-12 HISTORY — DX: Essential (primary) hypertension: I10

## 2021-11-12 LAB — COMPREHENSIVE METABOLIC PANEL
ALT: 16 U/L (ref 0–44)
AST: 17 U/L (ref 15–41)
Albumin: 3.9 g/dL (ref 3.5–5.0)
Alkaline Phosphatase: 63 U/L (ref 38–126)
Anion gap: 9 (ref 5–15)
BUN: 18 mg/dL (ref 6–20)
CO2: 25 mmol/L (ref 22–32)
Calcium: 9.2 mg/dL (ref 8.9–10.3)
Chloride: 106 mmol/L (ref 98–111)
Creatinine, Ser: 1.29 mg/dL — ABNORMAL HIGH (ref 0.61–1.24)
GFR, Estimated: >60 mL/min (ref >60)
Glucose, Bld: 99 mg/dL (ref 70–99)
Potassium: 4 mmol/L (ref 3.5–5.1)
Sodium: 140 mmol/L (ref 135–145)
Total Bilirubin: 0.8 mg/dL (ref 0.3–1.2)
Total Protein: 7.6 g/dL (ref 6.5–8.1)

## 2021-11-12 LAB — CBC
HCT: 47.7 % (ref 39.0–52.0)
Hemoglobin: 16 g/dL (ref 13.0–17.0)
MCH: 30.1 pg (ref 26.0–34.0)
MCHC: 33.5 g/dL (ref 30.0–36.0)
MCV: 89.7 fL (ref 80.0–100.0)
Platelets: 249 10*3/uL (ref 150–400)
RBC: 5.32 MIL/uL (ref 4.22–5.81)
RDW: 13 % (ref 11.5–15.5)
WBC: 9.1 10*3/uL (ref 4.0–10.5)
nRBC: 0 % (ref 0.0–0.2)

## 2021-11-12 LAB — LIPASE, BLOOD: Lipase: 26 U/L (ref 11–51)

## 2021-11-12 LAB — CBG MONITORING, ED: Glucose-Capillary: 98 mg/dL (ref 70–99)

## 2021-11-12 MED ORDER — IOHEXOL 300 MG/ML  SOLN
100.0000 mL | Freq: Once | INTRAMUSCULAR | Status: AC | PRN
  Administered 2021-11-12: 100 mL via INTRAVENOUS

## 2021-11-12 MED ORDER — ONDANSETRON 4 MG PO TBDP: 4.0000 mg | ORAL_TABLET | Freq: Four times a day (QID) | ORAL | 0 refills | Status: DC | PRN

## 2021-11-12 MED ORDER — SODIUM CHLORIDE 0.9 % IV BOLUS
1000.0000 mL | Freq: Once | INTRAVENOUS | Status: AC
  Administered 2021-11-12: 1000 mL via INTRAVENOUS

## 2021-11-12 NOTE — ED Triage Notes (Signed)
Says he went to work and felt like he's in a fog.  Has been having gi issues, but  this is new.

## 2021-11-12 NOTE — Discharge Instructions (Addendum)
Please establish a primary care physician for follow-up.  As we discussed, I think you need some additional follow-up to continue to monitor your blood pressures as well.  Please return to the emergency room right away if you are to develop a fever, severe nausea, your pain becomes severe or worsens, you are unable to keep food down, begin vomiting any dark or bloody fluid, you develop any dark or bloody stools, feel dehydrated, or other new concerns or symptoms arise.

## 2021-11-12 NOTE — ED Provider Notes (Signed)
[facility] Provider Note    Event Date/Time   First MD Initiated Contact with Patient 11/12/21 [phone]     (approximate)   History   Abdominal Pain   HPI  Gavin Dorsey is a 61 y.o. male with a history of hypertension and diet-controlled diabetes  Patient reports that he has been on a strict diet and is lost 60 pounds over the last several months.  He has been sticking to his particular diet, and over the weekend he visited relatives and had food like gravy and high fat foods which she has not typically taking.  Shortly after that he started having nausea cramps flatulence.  Several small bowel movements.  He basically went 24 hours without eating or drinking anything, but reports last night his stomach started feel better symptoms alleviated he ate a chicken sandwich last night went to work today and had 1 glass of water.  While at work today he notes he just kind of felt lightheaded or "foggy"  Not quite feeling himself.  No numbness or weakness.  No difficulty speaking no strokelike feeling.  No headache.  Reports he just felt lightheaded and a little bit not quite himself.  His stomach feels much better now, he is not having any nausea or pain.  Reports he did not drink or eat much over the last 24 hours until last night when he ate a chicken sandwich.  He checked his blood pressure from time to time at home and no longer takes any prescription medications, used to take metformin and blood pressure medicine but obstruction stopped taking those.  His blood pressure when he checks at home usually ranges from 140-150 on the top number     Physical Exam   Triage Vital Signs: ED Triage Vitals  Enc Vitals Group     BP 11/12/21 0910 (!) 171/105     Pulse Rate 11/12/21 0910 62     Resp 11/12/21 0910 16     Temp 11/12/21 0910 98.4 F (36.9 C)     Temp Source 11/12/21 0910 Oral     SpO2 11/12/21 0910 93 %     Weight --      Height --      Head Circumference  --      Peak Flow --      Pain Score 11/12/21 0859 0     Pain Loc --      Pain Edu? --      Excl. in GC? --     Most recent vital signs: Vitals:   11/12/21 0910  BP: (!) 171/105  Pulse: 62  Resp: 16  Temp: 98.4 F (36.9 C)  SpO2: 93%     General: Awake, no distress.  Very pleasant.  Fully oriented.  Conversant.  Denies any pain nausea or ongoing symptoms at this time CV:  Good peripheral perfusion.  Normal heart tones normal rate and rhythm Mucous membranes slightly dry Resp:  Normal effort.  Clear bilaterally.  Speaks in full clear sentences Abd:  No distention.  Soft nontender nondistended throughout.  Denies any pain to palpation in any quadrant.  No rebound or guarding.  No ongoing discomfort or nausea. Other:  Warm well-perfused peripheral extremities.  No lower extremity edema Normal facial expressions.  Moves all extremities without difficulty or deficit.  Normal orientation  ED Results / Procedures / Treatments   Labs (all labs ordered are listed, but only abnormal results are displayed) Labs Reviewed  COMPREHENSIVE  METABOLIC PANEL - Abnormal; Notable for the following components:      Result Value   Creatinine, Ser 1.29 (*)    All other components within normal limits  CBC  LIPASE, BLOOD  CBG MONITORING, ED     EKG  EKG interpreted by me at 932 heart rate 60 QRS 100 QTc 430 Normal sinus rhythm no evidence of acute ischemia.  Possible old anteroseptal infarct.   RADIOLOGY  CT ABDOMEN PELVIS W CONTRAST  Result Date: 11/12/2021 CLINICAL DATA:  Acute left lower quadrant abdominal pain. EXAM: CT ABDOMEN AND PELVIS WITH CONTRAST TECHNIQUE: Multidetector CT imaging of the abdomen and pelvis was performed using the standard protocol following bolus administration of intravenous contrast. RADIATION DOSE REDUCTION: This exam was performed according to the departmental dose-optimization program which includes automated exposure control, adjustment of the mA and/or  kV according to patient size and/or use of iterative reconstruction technique. CONTRAST:  OMNIPAQUE IOHEXOL 300 MG/ML  SOLN COMPARISON:  March 15, 2015. FINDINGS: Lower chest: No acute abnormality. Hepatobiliary: No focal liver abnormality is seen. No gallstones, gallbladder wall thickening, or biliary dilatation. Pancreas: Unremarkable. No pancreatic ductal dilatation or surrounding inflammatory changes. Spleen: Normal in size without focal abnormality. Adrenals/Urinary Tract: Adrenal glands appear normal. No hydronephrosis or renal obstruction is noted. Small nonobstructive right renal calculus is noted. Urinary bladder is unremarkable. Stomach/Bowel: Stomach is within normal limits. Appendix appears normal. No evidence of bowel wall thickening, distention, or inflammatory changes. Vascular/Lymphatic: Aortic atherosclerosis. No enlarged abdominal or pelvic lymph nodes. Reproductive: Prostate is unremarkable. Other: No abdominal wall hernia or abnormality. No abdominopelvic ascites. Musculoskeletal: No acute or significant osseous findings. IMPRESSION: Small nonobstructive right renal calculus. No hydronephrosis or renal obstruction is noted. Aortic Atherosclerosis (ICD10-I70.0). Electronically Signed   By: Lupita Raider M.D.   On: 11/12/2021 13:20    CT abdomen pelvis interpreted by me as negative for acute finding.  Reviewed radiologist report as well which shows a small nonobstructive right renal calculus.  Discussed with the patient, he reports he has a history of kidney stones, is not surprised he could have a kidney stone on the right but understands its not causing his pain or symptoms today.   PROCEDURES:  Critical Care performed: No  Procedures   MEDICATIONS ORDERED IN ED: Medications  sodium chloride 0.9 % bolus 1,000 mL (0 mLs Intravenous Stopped 11/12/21 1207)  iohexol (OMNIPAQUE) 300 MG/ML solution 100 mL (100 mLs Intravenous Contrast Given 11/12/21 1255)     IMPRESSION /  MDM / ASSESSMENT AND PLAN / ED COURSE  I reviewed the triage vital signs and the nursing notes.                              Differential diagnosis includes, but is not limited to, possible dehydration, ketosis secondary to not eating anything for about 24 hours, recent gastro enteritis like symptomatology, pancreatitis, hepatitis, arrhythmia etc.  Consider electrolyte abnormality.  Patient reports he had upset stomach mostly gastritis like symptomatology is since gone away.  When about 24 hours without eating or drinking anything though during that time.  Very reassuring abdominal exam at this time without evidence of acute abdomen or any abdominal discomfort.  No chest pain or trouble breathing.  Reassuring neurologic exam  No focal neurologic abnormalities to noted.  Reports a feeling of lightheadedness today at work, reports not eating well for 24 hours just regaining his appetite last night.  Had  nothing except water to drink forward today.  We will try having him some water and juice here and see if this might help, also plan to hydrate him with a liter of fluid as he appears just slightly volume  Patient's presentation is most consistent with acute complicated illness / injury requiring diagnostic workup.   The patient is on the cardiac monitor to evaluate for evidence of arrhythmia and/or significant heart rate changes.  Clinical Course as of 11/12/21 1517  Mon Nov 12, 2021  1204 Reevaluated patient, he reports he is feeling better and the mental cloudiness has improved.  He has been drinking juice here without issue.  He reports he is concerned now that he is continue to have this is a sort of couple weeks now of a feeling of lower abdominal discomfort off and on as well, reports it is primarily in the left lower abdomen also feels accompanied by a sense of fullness or constipation.  Discussed with patient and given his recent episode over the weekend and couple weeks of unusual abdominal  symptoms will obtain CT scan, which to evaluate exclude etiologies such as obstruction, mass lesion, diverticulitis etc. [MQ]    Clinical Course User Index [MQ] Sharyn Creamer, MD   ----------------------------------------- 3:19 PM on 11/12/2021 ----------------------------------------- Resting comfortably.  Symptoms resolved feels much better.  CT reassuring.  Discussed with patient careful return precautions, will provide Zofran should he need it.  He also plans to add some additional fiber to his diet.  Recommended he follow-up with a primary care doctor, he is agreeable with that and plans to engage provider that is covered under his health plan.  Return precautions and treatment recommendations and follow-up discussed with the patient who is agreeable with the plan.    FINAL CLINICAL IMPRESSION(S) / ED DIAGNOSES   Final diagnoses:  Dehydration, mild  Abdominal cramping  Kidney stone on right side     Rx / DC Orders   ED Discharge Orders          Ordered    ondansetron (ZOFRAN-ODT) 4 MG disintegrating tablet  Every 6 hours PRN        11/12/21 1515             Note:  This document was prepared using Dragon voice recognition software and may include unintentional dictation errors.   Sharyn Creamer, MD 11/12/21 [phone]

## 2022-08-16 ENCOUNTER — Encounter: Payer: Self-pay | Admitting: Urology

## 2022-08-16 ENCOUNTER — Ambulatory Visit (INDEPENDENT_AMBULATORY_CARE_PROVIDER_SITE_OTHER): Payer: 59 | Admitting: Urology

## 2022-08-16 VITALS — BP 160/97 | HR 89 | Ht 68.0 in | Wt 250.0 lb

## 2022-08-16 DIAGNOSIS — N529 Male erectile dysfunction, unspecified: Secondary | ICD-10-CM

## 2022-08-16 DIAGNOSIS — E291 Testicular hypofunction: Secondary | ICD-10-CM | POA: Diagnosis not present

## 2022-08-16 NOTE — Progress Notes (Signed)
    I, Gavin Dorsey,acting as a scribe for Gavin Altes, MD.,have documented all relevant documentation on the behalf of Gavin Altes, MD,as directed by  Gavin Altes, MD while in the presence of Gavin Altes, MD.   08/16/2022 10:50 AM   Gavin Dorsey 04-Dec-1960 [id number]   Chief Complaint  Patient presents with   Hypogonadism    HPI: Gavin Dorsey is a 62 y.o. male presenting to establish local urologic care after recent retirement of his previous urologist.  Saw Dr. Evelene Croon 01/22/22 with complaints of erectile dysfunction and decreased energy, fatigue, and deterioration in work performance.  He was started on compounded testosterone cream with significant improvement in his symptoms.  A follow-up testosterone level December 2023 was >1500 and his topical cream was decreased to 0.5 cc daily.  Has not had blood work drawn since decreasing the dose.  He is presently asymptomatic with resolution of his low T symptoms.  Tadalafil has been effective for his ED.   PMH: Past Medical History:  Diagnosis Date   Diabetes mellitus without complication (HCC)    Hypertension     Home Medications:  Allergies as of 08/16/2022   No Known Allergies      Medication List        Accurate as of Aug 16, 2022 10:50 AM. If you have any questions, ask your nurse or doctor.          STOP taking these medications    albuterol 108 (90 Base) MCG/ACT inhaler Commonly known as: VENTOLIN HFA Stopped by: Gavin Altes, MD   aspirin 81 MG chewable tablet Stopped by: Gavin Altes, MD   metFORMIN 1000 MG tablet Commonly known as: GLUCOPHAGE Stopped by: Gavin Altes, MD   multivitamin with minerals Tabs tablet Stopped by: Gavin Altes, MD       TAKE these medications    atenolol 25 MG tablet Commonly known as: Tenormin Take 1 tablet (25 mg total) by mouth daily.   diazepam 10 MG tablet Commonly known as: VALIUM Take 5-10 mg by mouth daily as needed.    ondansetron 4 MG disintegrating tablet Commonly known as: ZOFRAN-ODT Take 1 tablet (4 mg total) by mouth every 6 (six) hours as needed for nausea or vomiting.        Allergies: No Known Allergies  Social History:  reports that he has never smoked. He has never used smokeless tobacco. He reports current alcohol use. No history on file for drug use.   Physical Exam: BP (!) 160/97   Pulse 89   Ht 5\' 8"  (1.727 m)   Wt 250 lb (113.4 kg)   BMI 38.01 kg/m   Constitutional:  Alert and oriented, No acute distress. HEENT: [place] AT Respiratory: Normal respiratory effort, no increased work of breathing. Psychiatric: Normal mood and affect.   Assessment & Plan:    1. Hypogonadism Testosterone, PSA, and hematocrit drawn today. If lab stable, 31-month lab visit for testosterone/hematocrit and 1-year follow-up for testosterone, PSA, and hematocrit.   2. Erectile dysfunction Stable on Tadalafil, he did not need refills at this time.  I have reviewed the above documentation for accuracy and completeness, and I agree with the above.   Gavin Altes, MD  [facility] [address] [place], [place] [address] [phone]

## 2022-08-16 NOTE — Progress Notes (Signed)
Gavin Dorsey presents for an office/procedure visit. BP today is high  He is complaint/noncompliant with BP medication. Greater than 140/90. Provider  notified. Pt advised to see pcp  .Pt voiced understanding.

## 2022-08-17 LAB — TESTOSTERONE: Testosterone: 728 ng/dL (ref 264–916)

## 2022-08-17 LAB — PSA: Prostate Specific Ag, Serum: 0.6 ng/mL (ref 0.0–4.0)

## 2022-08-17 LAB — HEMATOCRIT: Hematocrit: 47 % (ref 37.5–51.0)

## 2022-08-19 ENCOUNTER — Telehealth: Payer: Self-pay | Admitting: Family Medicine

## 2022-08-19 MED ORDER — AMBULATORY NON FORMULARY MEDICATION: 5 refills | Status: DC

## 2022-08-19 NOTE — Telephone Encounter (Signed)
Patient notified RX done.

## 2022-08-19 NOTE — Telephone Encounter (Signed)
-----   Message from Riki Altes, MD sent at 08/17/2022 11:47 AM EDT ----- Labs look good-testosterone 728; PSA 0.6 and hematocrit 47.0.  Please refill compounded testosterone cream 20% at [facility] in [place] (compounding pharmacy Dr. Evelene Croon was using) 0.5 cc apply daily.  1 month supply with 5 refills

## 2022-10-31 ENCOUNTER — Emergency Department
Admission: EM | Admit: 2022-10-31 | Discharge: 2022-10-31 | Disposition: A | Discharge status: Home / Self Care | Payer: 59 | Attending: Emergency Medicine | Admitting: Emergency Medicine

## 2022-10-31 ENCOUNTER — Other Ambulatory Visit: Payer: Self-pay

## 2022-10-31 ENCOUNTER — Emergency Department: Payer: 59

## 2022-10-31 DIAGNOSIS — R519 Headache, unspecified: Secondary | ICD-10-CM | POA: Diagnosis present

## 2022-10-31 DIAGNOSIS — S0003XA Contusion of scalp, initial encounter: Secondary | ICD-10-CM | POA: Diagnosis not present

## 2022-10-31 DIAGNOSIS — S80211A Abrasion, right knee, initial encounter: Secondary | ICD-10-CM | POA: Insufficient documentation

## 2022-10-31 DIAGNOSIS — I10 Essential (primary) hypertension: Secondary | ICD-10-CM | POA: Insufficient documentation

## 2022-10-31 DIAGNOSIS — E119 Type 2 diabetes mellitus without complications: Secondary | ICD-10-CM | POA: Diagnosis not present

## 2022-10-31 DIAGNOSIS — W01198A Fall on same level from slipping, tripping and stumbling with subsequent striking against other object, initial encounter: Secondary | ICD-10-CM | POA: Diagnosis not present

## 2022-10-31 DIAGNOSIS — I440 Atrioventricular block, first degree: Secondary | ICD-10-CM | POA: Diagnosis not present

## 2022-10-31 DIAGNOSIS — M542 Cervicalgia: Secondary | ICD-10-CM | POA: Diagnosis not present

## 2022-10-31 LAB — CBC WITH DIFFERENTIAL/PLATELET
Abs Immature Granulocytes: 0.02 10*3/uL (ref 0.00–0.07)
Basophils Absolute: 0 10*3/uL (ref 0.0–0.1)
Basophils Relative: 1 %
Eosinophils Absolute: 0.3 10*3/uL (ref 0.0–0.5)
Eosinophils Relative: 4 %
HCT: 46.8 % (ref 39.0–52.0)
Hemoglobin: 14.9 g/dL (ref 13.0–17.0)
Immature Granulocytes: 0 %
Lymphocytes Relative: 20 %
Lymphs Abs: 1.6 10*3/uL (ref 0.7–4.0)
MCH: 24.4 pg — ABNORMAL LOW (ref 26.0–34.0)
MCHC: 31.8 g/dL (ref 30.0–36.0)
MCV: 76.6 fL — ABNORMAL LOW (ref 80.0–100.0)
Monocytes Absolute: 0.8 10*3/uL (ref 0.1–1.0)
Monocytes Relative: 9 %
Neutro Abs: 5.3 10*3/uL (ref 1.7–7.7)
Neutrophils Relative %: 66 %
Platelets: 301 10*3/uL (ref 150–400)
RBC: 6.11 MIL/uL — ABNORMAL HIGH (ref 4.22–5.81)
RDW: 19.1 % — ABNORMAL HIGH (ref 11.5–15.5)
WBC: 8.1 10*3/uL (ref 4.0–10.5)
nRBC: 0 % (ref 0.0–0.2)

## 2022-10-31 LAB — BASIC METABOLIC PANEL
Anion gap: 8 (ref 5–15)
BUN: 19 mg/dL (ref 8–23)
CO2: 25 mmol/L (ref 22–32)
Calcium: 8.4 mg/dL — ABNORMAL LOW (ref 8.9–10.3)
Chloride: 101 mmol/L (ref 98–111)
Creatinine, Ser: 1.23 mg/dL (ref 0.61–1.24)
GFR, Estimated: >60 mL/min (ref >60)
Glucose, Bld: 115 mg/dL — ABNORMAL HIGH (ref 70–99)
Potassium: 4.1 mmol/L (ref 3.5–5.1)
Sodium: 134 mmol/L — ABNORMAL LOW (ref 135–145)

## 2022-10-31 NOTE — ED Provider Notes (Signed)
[facility] Provider Note    Event Date/Time   First MD Initiated Contact with Patient 10/31/22 0815     (approximate)   History   Chief Complaint: Fall   HPI  Gavin Dorsey is a 62 y.o. male with a history of hypertension, diabetes who was in his usual state of health this morning, tripped over a floor threshold, landing on his right knee and falling and hitting his right forehead on the ground.  This caused him to lose consciousness.  He woke up a few minutes later and was able to get up and ambulate.  Complains of headache and neck pain.  No paresthesias or motor weakness, no vision changes.     Physical Exam   Triage Vital Signs: ED Triage Vitals  Encounter Vitals Group     BP 10/31/22 0755 (!) 154/100     Systolic BP Percentile --      Diastolic BP Percentile --      Pulse Rate 10/31/22 0755 88     Resp 10/31/22 0755 20     Temp 10/31/22 0755 98.8 F (37.1 C)     Temp Source 10/31/22 0755 Oral     SpO2 10/31/22 0755 94 %     Weight 10/31/22 0756 258 lb (117 kg)     Height 10/31/22 0829 5\' 8"  (1.727 m)     Head Circumference --      Peak Flow --      Pain Score 10/31/22 0755 7     Pain Loc --      Pain Education --      Exclude from Growth Chart --     Most recent vital signs: Vitals:   10/31/22 0900 10/31/22 0914  BP: 137/80   Pulse: 75   Resp: 18   Temp:  98.7 F (37.1 C)  SpO2: 96%     General: Awake, no distress.  CV:  Good peripheral perfusion.  Regular rate and rhythm Resp:  Normal effort.  Clear to auscultation bilaterally Abd:  No distention.  Soft nontender Other:  Full range of motion all extremities.  No bony tenderness.  Small superficial abrasion over the right prepatellar base with associated swelling.  Knee is stable.  No laceration.   ED Results / Procedures / Treatments   Labs (all labs ordered are listed, but only abnormal results are displayed) Labs Reviewed  CBC WITH DIFFERENTIAL/PLATELET - Abnormal;  Notable for the following components:      Result Value   RBC 6.11 (*)    MCV 76.6 (*)    MCH 24.4 (*)    RDW 19.1 (*)    All other components within normal limits  BASIC METABOLIC PANEL - Abnormal; Notable for the following components:   Sodium 134 (*)    Glucose, Bld 115 (*)    Calcium 8.4 (*)    All other components within normal limits     EKG Interpreted by me Sinus rhythm, rate of 85.  Left axis, first-degree AV block.  Poor R wave progression.  Normal ST segments and T waves.   RADIOLOGY CT head interpreted by me, appears normal.  Radiology report reviewed.  CT cervical spine negative for fracture.   PROCEDURES:  Procedures   MEDICATIONS ORDERED IN ED: Medications - No data to display   IMPRESSION / MDM / ASSESSMENT AND PLAN / ED COURSE  I reviewed the triage vital signs and the nursing notes.  DDx: Intracranial hemorrhage, skull fracture, scalp  contusion, C-spine fracture  Patient's presentation is most consistent with acute presentation with potential threat to life or bodily function.  Patient presents with mechanical fall causing blunt head trauma with loss of consciousness.  In the ED he is not in distress, vital signs unremarkable.  CT imaging negative for serious injuries.  Labs reassuring.  Wound/skin exam is not worrisome for tetanus exposure.  Stable for discharge       FINAL CLINICAL IMPRESSION(S) / ED DIAGNOSES   Final diagnoses:  Contusion of scalp, initial encounter     Rx / DC Orders   ED Discharge Orders     None        Note:  This document was prepared using Dragon voice recognition software and may include unintentional dictation errors.   Sharman Cheek, MD 10/31/22 [phone]

## 2022-10-31 NOTE — ED Triage Notes (Signed)
BIB friend/POV s/p fall, hit head with LOC. C/o generalized body aches, as well as head, neck, back and knees. Describes mechanical fall tripped. Takes aspirin daily, but did not have today. Took testosterone cream and tadalafil PTA. Endorses some nausea.

## 2022-10-31 NOTE — ED Notes (Signed)
Pt is resting in bed, A&O x 4, breathing is unlabored.

## 2022-10-31 NOTE — Discharge Instructions (Signed)
Your CT scan of the head and neck are okay today.  Lab tests were also unremarkable.

## 2022-11-04 ENCOUNTER — Emergency Department
Admission: EM | Admit: 2022-11-04 | Discharge: 2022-11-04 | Disposition: A | Discharge status: Home / Self Care | Payer: 59 | Source: Home / Self Care | Attending: Emergency Medicine | Admitting: Emergency Medicine

## 2022-11-04 ENCOUNTER — Other Ambulatory Visit: Payer: Self-pay

## 2022-11-04 ENCOUNTER — Encounter: Payer: Self-pay | Admitting: Emergency Medicine

## 2022-11-04 DIAGNOSIS — E119 Type 2 diabetes mellitus without complications: Secondary | ICD-10-CM | POA: Diagnosis not present

## 2022-11-04 DIAGNOSIS — S060X0A Concussion without loss of consciousness, initial encounter: Secondary | ICD-10-CM | POA: Diagnosis not present

## 2022-11-04 DIAGNOSIS — I10 Essential (primary) hypertension: Secondary | ICD-10-CM | POA: Insufficient documentation

## 2022-11-04 DIAGNOSIS — W19XXXA Unspecified fall, initial encounter: Secondary | ICD-10-CM | POA: Diagnosis not present

## 2022-11-04 DIAGNOSIS — S0990XA Unspecified injury of head, initial encounter: Secondary | ICD-10-CM | POA: Diagnosis present

## 2022-11-04 DIAGNOSIS — S060X0D Concussion without loss of consciousness, subsequent encounter: Secondary | ICD-10-CM

## 2022-11-04 LAB — BASIC METABOLIC PANEL
Anion gap: 7 (ref 5–15)
BUN: 16 mg/dL (ref 8–23)
CO2: 29 mmol/L (ref 22–32)
Calcium: 8.8 mg/dL — ABNORMAL LOW (ref 8.9–10.3)
Chloride: 101 mmol/L (ref 98–111)
Creatinine, Ser: 1.27 mg/dL — ABNORMAL HIGH (ref 0.61–1.24)
GFR, Estimated: >60 mL/min (ref >60)
Glucose, Bld: 109 mg/dL — ABNORMAL HIGH (ref 70–99)
Potassium: 4.2 mmol/L (ref 3.5–5.1)
Sodium: 137 mmol/L (ref 135–145)

## 2022-11-04 LAB — CBC
HCT: 50.9 % (ref 39.0–52.0)
Hemoglobin: 15.6 g/dL (ref 13.0–17.0)
MCH: 23.8 pg — ABNORMAL LOW (ref 26.0–34.0)
MCHC: 30.6 g/dL (ref 30.0–36.0)
MCV: 77.7 fL — ABNORMAL LOW (ref 80.0–100.0)
Platelets: 326 10*3/uL (ref 150–400)
RBC: 6.55 MIL/uL — ABNORMAL HIGH (ref 4.22–5.81)
RDW: 19.2 % — ABNORMAL HIGH (ref 11.5–15.5)
WBC: 10.3 10*3/uL (ref 4.0–10.5)
nRBC: 0 % (ref 0.0–0.2)

## 2022-11-04 NOTE — ED Triage Notes (Signed)
See [facility] report. Pt says that symptoms seem to be getting worse but is unsure. No weakness, pt able to ambulate without assist. Speech clear.

## 2022-11-04 NOTE — ED Provider Notes (Signed)
[facility] Provider Note    Event Date/Time   First MD Initiated Contact with Patient 11/04/22 1153     (approximate)   History   No chief complaint on file.   HPI  Gavin Dorsey is a 62 y.o. male   Past medical history of diabetes and hypertension who presents to the emergency department with ongoing headache, occasional nausea, orthostatic dizziness, occasional blurry vision since a fall he sustained a few days ago.  He tripped on a rug and hit his head.  He had negative CT scan then.  He does not take blood thinners.  He has not had no new trauma.  Since that time he continues to have symptoms of headache, occasional blurry vision, occasional lightheadedness, occasional nausea.   External Medical Documents Reviewed: CT scan from 10/31/2022 that showed no intracranial abnormality      Physical Exam   Triage Vital Signs: ED Triage Vitals  Encounter Vitals Group     BP 11/04/22 0957 (!) 147/101     Systolic BP Percentile --      Diastolic BP Percentile --      Pulse Rate 11/04/22 0957 80     Resp 11/04/22 0957 18     Temp 11/04/22 0957 98.8 F (37.1 C)     Temp Source 11/04/22 0957 Oral     SpO2 11/04/22 0957 95 %     Weight 11/04/22 1012 254 lb 15.7 oz (115.7 kg)     Height 11/04/22 1012 5\' 8"  (1.727 m)     Head Circumference --      Peak Flow --      Pain Score 11/04/22 1011 7     Pain Loc --      Pain Education --      Exclude from Growth Chart --     Most recent vital signs: Vitals:   11/04/22 0957 11/04/22 1215  BP: (!) 147/101 (!) 181/84  Pulse: 80 70  Resp: 18 20  Temp: 98.8 F (37.1 C)   SpO2: 95% 100%    General: Awake, no distress.  CV:  Good peripheral perfusion.  Resp:  Normal effort.  Abd:  No distention.  Other:  Awake alert comfortable appearing hypertensive otherwise vital signs normal.  No motor or sensory deficits, neck supple with full range of motion, no obvious signs of trauma on my exam to the  head.   ED Results / Procedures / Treatments   Labs (all labs ordered are listed, but only abnormal results are displayed) Labs Reviewed  CBC - Abnormal; Notable for the following components:      Result Value   RBC 6.55 (*)    MCV 77.7 (*)    MCH 23.8 (*)    RDW 19.2 (*)    All other components within normal limits  BASIC METABOLIC PANEL - Abnormal; Notable for the following components:   Glucose, Bld 109 (*)    Creatinine, Ser 1.27 (*)    Calcium 8.8 (*)    All other components within normal limits     I ordered and reviewed the above labs they are notable for his electrolytes and cell counts are at baseline compared to prior testing  EKG  ED ECG REPORT I, Pilar Jarvis, the attending physician, personally viewed and interpreted this ECG.   Date: 11/04/2022  EKG Time: 1136  Rate: 64  Rhythm: nsr  Axis: nl  Intervals: 1st deg av block  ST&T Change: no stemi  PROCEDURES:  Critical Care performed: No  Procedures   MEDICATIONS ORDERED IN ED: Medications - No data to display   IMPRESSION / MDM / ASSESSMENT AND PLAN / ED COURSE  I reviewed the triage vital signs and the nursing notes.                                Patient's presentation is most consistent with acute presentation with potential threat to life or bodily function.  Differential diagnosis includes, but is not limited to, postconcussive symptoms, retinal detachment, intracranial bleeding   The patient is on the cardiac monitor to evaluate for evidence of arrhythmia and/or significant heart rate changes.  MDM: Symptoms consistent with concussion.  Discussed concussion protocol, medication management, follow-up with primary doctor.  He is hypertensive but he states history of whitecoat syndrome, and has checked his blood pressure at home with systolics in the 130s.  Defer treatment and have him keep a log of his blood pressures to discuss with primary care doctor on follow-up.  He states blurry  vision but quickly resolves and is associated with standing quickly with lightheadedness at the time as well.  Visual acuity is normal now, no flashers or floaters to suggest traumatic retinal detachment.  CT scan from trauma a few days ago was normal, no new trauma, defer further imaging today.      FINAL CLINICAL IMPRESSION(S) / ED DIAGNOSES   Final diagnoses:  Concussion without loss of consciousness, subsequent encounter     Rx / DC Orders   ED Discharge Orders          Ordered    Ambulatory Referral to Primary Care (Establish Care)        11/04/22 1220             Note:  This document was prepared using Dragon voice recognition software and may include unintentional dictation errors.    Pilar Jarvis, MD 11/04/22 [phone]

## 2022-11-04 NOTE — Discharge Instructions (Addendum)
Take acetaminophen 650 mg and ibuprofen 400 mg every 6 hours for pain.  Take with food. Use Zofran for nausea as needed.  Stay well-hydrated by drinking plenty of fluids.  Your symptoms will persist for several days up to weeks before they start to get better.  I reviewed your CT scan from the day of your fall and it fortunately did not show any signs of broken bones or bleeding inside of your brain.  Please do not partake in any strenuous physical activities, especially activities that may put you at risk for injuring your head again, get plenty of rest and relaxation as we spoke of.  I wrote you a work note.  Follow-up with your regular doctor within 1 to 2 weeks.  Your blood pressure was on the higher side today in the emergency department.  Check your blood pressure when you are calm and rested at home and keep a general log of these blood pressure readings to discuss with your primary doctor and start on medications as indicated.  Thank you for choosing [place] for your health care today!  Please see your primary doctor this week for a follow up appointment.   If you have any new, worsening, or unexpected symptoms call your doctor right away or come back to the emergency department for reevaluation.  It was my pleasure to care for you today.   Daneil Dan Modesto Charon, MD

## 2022-11-04 NOTE — ED Triage Notes (Signed)
[facility] report: pt seen here 7/18 for head injury. Here today for HA, dizzy, blurred vision.

## 2023-01-01 ENCOUNTER — Telehealth: Payer: Self-pay

## 2023-01-01 NOTE — Telephone Encounter (Signed)
Pt is requesting a refill of tadalafil sent to [facility] on [address].   LV-08/16/22 - NO refills needed at that time.   Pt aware SCS in clinic today. Will respond in 48hours.

## 2023-01-02 ENCOUNTER — Encounter: Payer: Self-pay | Admitting: *Deleted

## 2023-01-02 MED ORDER — TADALAFIL 5 MG PO TABS: 5.0000 mg | ORAL_TABLET | Freq: Every day | ORAL | 2 refills | Status: DC | PRN

## 2023-01-02 NOTE — Telephone Encounter (Signed)
Medication sent to [facility]. Sent my chart message

## 2023-01-30 ENCOUNTER — Other Ambulatory Visit: Payer: Self-pay | Admitting: *Deleted

## 2023-01-30 DIAGNOSIS — E291 Testicular hypofunction: Secondary | ICD-10-CM

## 2023-02-17 ENCOUNTER — Other Ambulatory Visit: Payer: Self-pay

## 2023-02-19 ENCOUNTER — Encounter: Payer: Self-pay | Admitting: Urology

## 2023-02-25 ENCOUNTER — Other Ambulatory Visit: Payer: Self-pay

## 2023-03-06 ENCOUNTER — Other Ambulatory Visit: Payer: Self-pay

## 2023-03-06 DIAGNOSIS — E291 Testicular hypofunction: Secondary | ICD-10-CM

## 2023-03-07 ENCOUNTER — Telehealth: Payer: Self-pay | Admitting: Urology

## 2023-03-07 LAB — TESTOSTERONE: Testosterone: 1280 ng/dL — ABNORMAL HIGH (ref 264–916)

## 2023-03-07 LAB — HEMATOCRIT: Hematocrit: 54.3 % — ABNORMAL HIGH (ref 37.5–51.0)

## 2023-03-07 NOTE — Telephone Encounter (Signed)
Patient states once daily applied in inner thigh. He applied medication before his lab appt.

## 2023-03-07 NOTE — Telephone Encounter (Signed)
Notified patient as instructed, patient pleased. Discussed follow-up appointments, patient agrees  

## 2023-03-07 NOTE — Telephone Encounter (Signed)
Have him hold the cream for 3 days then apply every other day.  Recheck testosterone level in 1 month

## 2023-03-07 NOTE — Telephone Encounter (Signed)
Testosterone elevated at 1280 and hematocrit elevated at 54.3.  How often is he applying his testosterone cream and where is he placing the medication?

## 2023-04-01 ENCOUNTER — Inpatient Hospital Stay (HOSPITAL_COMMUNITY)
Admission: EM | Admit: 2023-04-01 | Discharge: 2023-04-06 | DRG: 024 | Disposition: A | Discharge status: Home / Self Care | Payer: Medicaid Other | Source: Other Acute Inpatient Hospital | Attending: Neurology | Admitting: Neurology

## 2023-04-01 ENCOUNTER — Encounter: Payer: Self-pay | Admitting: Emergency Medicine

## 2023-04-01 ENCOUNTER — Emergency Department: Payer: Medicaid Other

## 2023-04-01 ENCOUNTER — Encounter (HOSPITAL_COMMUNITY): Payer: Self-pay

## 2023-04-01 ENCOUNTER — Emergency Department
Admission: EM | Admit: 2023-04-01 | Discharge: 2023-04-01 | DRG: 065 | Disposition: A | Discharge status: Other Acute Inpatient Hospital | Payer: Medicaid Other | Attending: Family Medicine | Admitting: Family Medicine

## 2023-04-01 ENCOUNTER — Inpatient Hospital Stay: Payer: Medicaid Other

## 2023-04-01 ENCOUNTER — Other Ambulatory Visit: Payer: Self-pay

## 2023-04-01 DIAGNOSIS — D751 Secondary polycythemia: Secondary | ICD-10-CM | POA: Diagnosis not present

## 2023-04-01 DIAGNOSIS — H9202 Otalgia, left ear: Secondary | ICD-10-CM | POA: Diagnosis present

## 2023-04-01 DIAGNOSIS — Z6833 Body mass index (BMI) 33.0-33.9, adult: Secondary | ICD-10-CM | POA: Diagnosis not present

## 2023-04-01 DIAGNOSIS — I771 Stricture of artery: Secondary | ICD-10-CM | POA: Diagnosis not present

## 2023-04-01 DIAGNOSIS — N529 Male erectile dysfunction, unspecified: Secondary | ICD-10-CM | POA: Diagnosis not present

## 2023-04-01 DIAGNOSIS — I10 Essential (primary) hypertension: Secondary | ICD-10-CM | POA: Diagnosis present

## 2023-04-01 DIAGNOSIS — E119 Type 2 diabetes mellitus without complications: Secondary | ICD-10-CM | POA: Diagnosis present

## 2023-04-01 DIAGNOSIS — R29701 NIHSS score 1: Secondary | ICD-10-CM | POA: Diagnosis not present

## 2023-04-01 DIAGNOSIS — E785 Hyperlipidemia, unspecified: Secondary | ICD-10-CM | POA: Diagnosis not present

## 2023-04-01 DIAGNOSIS — Z7982 Long term (current) use of aspirin: Secondary | ICD-10-CM

## 2023-04-01 DIAGNOSIS — E669 Obesity, unspecified: Secondary | ICD-10-CM | POA: Diagnosis present

## 2023-04-01 DIAGNOSIS — I6322 Cerebral infarction due to unspecified occlusion or stenosis of basilar arteries: Principal | ICD-10-CM | POA: Diagnosis present

## 2023-04-01 DIAGNOSIS — R270 Ataxia, unspecified: Secondary | ICD-10-CM | POA: Diagnosis present

## 2023-04-01 DIAGNOSIS — E291 Testicular hypofunction: Secondary | ICD-10-CM | POA: Diagnosis not present

## 2023-04-01 DIAGNOSIS — R29700 NIHSS score 0: Secondary | ICD-10-CM | POA: Diagnosis present

## 2023-04-01 DIAGNOSIS — Z66 Do not resuscitate: Secondary | ICD-10-CM | POA: Diagnosis present

## 2023-04-01 DIAGNOSIS — I651 Occlusion and stenosis of basilar artery: Secondary | ICD-10-CM | POA: Diagnosis not present

## 2023-04-01 DIAGNOSIS — I639 Cerebral infarction, unspecified: Secondary | ICD-10-CM

## 2023-04-01 DIAGNOSIS — I6389 Other cerebral infarction: Secondary | ICD-10-CM | POA: Diagnosis not present

## 2023-04-01 DIAGNOSIS — R1310 Dysphagia, unspecified: Secondary | ICD-10-CM | POA: Diagnosis present

## 2023-04-01 DIAGNOSIS — R2689 Other abnormalities of gait and mobility: Secondary | ICD-10-CM | POA: Diagnosis present

## 2023-04-01 DIAGNOSIS — H9312 Tinnitus, left ear: Secondary | ICD-10-CM | POA: Diagnosis present

## 2023-04-01 DIAGNOSIS — G8194 Hemiplegia, unspecified affecting left nondominant side: Secondary | ICD-10-CM | POA: Diagnosis not present

## 2023-04-01 DIAGNOSIS — Z713 Dietary counseling and surveillance: Secondary | ICD-10-CM | POA: Diagnosis not present

## 2023-04-01 DIAGNOSIS — I6329 Cerebral infarction due to unspecified occlusion or stenosis of other precerebral arteries: Secondary | ICD-10-CM | POA: Diagnosis not present

## 2023-04-01 DIAGNOSIS — R2681 Unsteadiness on feet: Principal | ICD-10-CM

## 2023-04-01 LAB — HEMOGLOBIN A1C
Hgb A1c MFr Bld: 5.2 % (ref 4.8–5.6)
Mean Plasma Glucose: 102.54 mg/dL

## 2023-04-01 LAB — COMPREHENSIVE METABOLIC PANEL
ALT: 16 U/L (ref 0–44)
AST: 17 U/L (ref 15–41)
Albumin: 3.7 g/dL (ref 3.5–5.0)
Alkaline Phosphatase: 70 U/L (ref 38–126)
Anion gap: 10 (ref 5–15)
BUN: 19 mg/dL (ref 8–23)
CO2: 26 mmol/L (ref 22–32)
Calcium: 9 mg/dL (ref 8.9–10.3)
Chloride: 101 mmol/L (ref 98–111)
Creatinine, Ser: 1.33 mg/dL — ABNORMAL HIGH (ref 0.61–1.24)
GFR, Estimated: >60 mL/min (ref >60)
Glucose, Bld: 119 mg/dL — ABNORMAL HIGH (ref 70–99)
Potassium: 3.7 mmol/L (ref 3.5–5.1)
Sodium: 137 mmol/L (ref 135–145)
Total Bilirubin: 0.8 mg/dL (ref <1.2)
Total Protein: 7.4 g/dL (ref 6.5–8.1)

## 2023-04-01 LAB — DIFFERENTIAL
Abs Immature Granulocytes: 0.02 10*3/uL (ref 0.00–0.07)
Basophils Absolute: 0 10*3/uL (ref 0.0–0.1)
Basophils Relative: 1 %
Eosinophils Absolute: 0.4 10*3/uL (ref 0.0–0.5)
Eosinophils Relative: 5 %
Immature Granulocytes: 0 %
Lymphocytes Relative: 32 %
Lymphs Abs: 2.5 10*3/uL (ref 0.7–4.0)
Monocytes Absolute: 0.6 10*3/uL (ref 0.1–1.0)
Monocytes Relative: 8 %
Neutro Abs: 4.2 10*3/uL (ref 1.7–7.7)
Neutrophils Relative %: 54 %

## 2023-04-01 LAB — CBC
HCT: 55.3 % — ABNORMAL HIGH (ref 39.0–52.0)
Hemoglobin: 18 g/dL — ABNORMAL HIGH (ref 13.0–17.0)
MCH: 27.8 pg (ref 26.0–34.0)
MCHC: 32.5 g/dL (ref 30.0–36.0)
MCV: 85.3 fL (ref 80.0–100.0)
Platelets: 233 10*3/uL (ref 150–400)
RBC: 6.48 MIL/uL — ABNORMAL HIGH (ref 4.22–5.81)
RDW: 16.7 % — ABNORMAL HIGH (ref 11.5–15.5)
WBC: 7.7 10*3/uL (ref 4.0–10.5)
nRBC: 0 % (ref 0.0–0.2)

## 2023-04-01 LAB — PROTIME-INR
INR: 1 (ref 0.8–1.2)
Prothrombin Time: 13.4 s (ref 11.4–15.2)

## 2023-04-01 LAB — ETHANOL: Alcohol, Ethyl (B): <10 mg/dL (ref <10)

## 2023-04-01 LAB — APTT: aPTT: 31 s (ref 24–36)

## 2023-04-01 MED ORDER — IOHEXOL 350 MG/ML SOLN
75.0000 mL | Freq: Once | INTRAVENOUS | Status: AC | PRN
  Administered 2023-04-01: 75 mL via INTRAVENOUS

## 2023-04-01 MED ORDER — SODIUM CHLORIDE 0.9% FLUSH
3.0000 mL | Freq: Once | INTRAVENOUS | Status: AC
  Administered 2023-04-01: 3 mL via INTRAVENOUS

## 2023-04-01 MED ORDER — CLOPIDOGREL BISULFATE 75 MG PO TABS
300.0000 mg | ORAL_TABLET | Freq: Once | ORAL | Status: AC
  Administered 2023-04-01: 300 mg via ORAL
  Filled 2023-04-01: qty 4

## 2023-04-01 MED ORDER — ASPIRIN 81 MG PO CHEW
81.0000 mg | CHEWABLE_TABLET | Freq: Every day | ORAL | Status: DC
  Administered 2023-04-01: 81 mg via ORAL

## 2023-04-01 MED ORDER — ASPIRIN 81 MG PO CHEW
243.0000 mg | CHEWABLE_TABLET | Freq: Once | ORAL | Status: AC
  Administered 2023-04-01: 243 mg via ORAL
  Filled 2023-04-01: qty 3

## 2023-04-01 MED ORDER — STROKE: EARLY STAGES OF RECOVERY BOOK: Freq: Once | Status: DC

## 2023-04-01 NOTE — Assessment & Plan Note (Signed)
Left-sided ear pain x 1 week with noted left-sided weakness and gait instability x 3 to 4 days Risk factors include age, obesity, testosterone use CT head w/ age indeterminate infarct in the left occipital lobe  Will plan for formal CVA eval including MRI brain, 2d ECHO, CTA head and neck, risk stratification labs  Neurology consult as appropriate  Follow

## 2023-04-01 NOTE — H&P (Signed)
History and Physical    Patient: Gavin Dorsey VHQ:[id number] DOB: 1960-12-03 DOA: 04/01/2023 DOS: the patient was seen and examined on 04/01/2023 PCP: Simonne Martinet, MD  Patient coming from: Home  Chief Complaint: No chief complaint on file.  HPI: Gavin Dorsey is a 62 y.o. male with medical history significant of obesity, hypogonadism presenting with CVA.  Patient reports left ear pain for approximately 1 week.  Has had significant headache earache and discomfort.  States he went shooting around similar timeframe.  Minimal visual changes.  Also with some mild slowed speech.  Reported worsening gait instability over the past 4 to 5 days.  No focal hemiparesis.  Minimal paresthesias.  No chest pain.  No shortness of breath.  No abdominal pain.  Patient denies any known prior history of CVA in the past.  No fevers or chills.  No head trauma.  Non-smoker.  Patient denies any alcohol use.  On testosterone.  Last level was at roughly 1300 on November 21 with baseline testosterone range between 200-900. Presented to the ER afebrile, hemodynamically stable.  Satting well on room air.  White count 7.7, hemoglobin 18, platelets 233, creatinine 1.33. Review of Systems: As mentioned in the history of present illness. All other systems reviewed and are negative. Past Medical History:  Diagnosis Date   Diabetes mellitus without complication (HCC)    Hypertension    History reviewed. No pertinent surgical history. Social History:  reports that he has never smoked. He has never used smokeless tobacco. He reports current alcohol use. He reports that he does not use drugs.  No Known Allergies  History reviewed. No pertinent family history.  Prior to Admission medications   Medication Sig Start Date End Date Taking? Authorizing Provider  AMBULATORY NON FORMULARY MEDICATION Testosterone Cream 20%  Apply 0.5cc daily  Quantity: 30ml Refill 5  [facility] [address] Dr. Jerlyn Ly [phone] 08/19/22   Riki Altes, MD  diazepam (VALIUM) 5 MG tablet Take 5 mg by mouth 2 (two) times daily as needed.    [provider]  tadalafil (CIALIS) 5 MG tablet Take 1 tablet (5 mg total) by mouth daily as needed for erectile dysfunction. 01/02/23   Riki Altes, MD    Physical Exam: Vitals:   04/01/23 0828 04/01/23 1301 04/01/23 1304 04/01/23 1312  BP:  (!) 150/110    Pulse: 86 88    Resp:  20    Temp:    98 F (36.7 C)  TempSrc:      SpO2:  98%    Weight:   115.7 kg   Height:   5\' 8"  (1.727 m)    Physical Exam Constitutional:      Appearance: He is obese.  HENT:     Head: Normocephalic and atraumatic.     Left Ear: Tympanic membrane normal.     Nose: Nose normal.     Mouth/Throat:     Mouth: Mucous membranes are moist.  Eyes:     Pupils: Pupils are equal, round, and reactive to light.  Cardiovascular:     Rate and Rhythm: Normal rate and regular rhythm.  Pulmonary:     Effort: Pulmonary effort is normal.  Abdominal:     General: Bowel sounds are normal.  Musculoskeletal:        General: Normal range of motion.  Skin:    General: Skin is warm.  Neurological:     General: No focal deficit present.  Psychiatric:        Mood and Affect: Mood normal.     Data Reviewed:  There are no new results to review at this time.  CT HEAD WO CONTRAST CLINICAL DATA:  Left-sided ear pain  EXAM: CT HEAD WITHOUT CONTRAST  TECHNIQUE: Contiguous axial images were obtained from the base of the skull through the vertex without intravenous contrast.  RADIATION DOSE REDUCTION: This exam was performed according to the departmental dose-optimization program which includes automated exposure control, adjustment of the mA and/or kV according to patient size and/or use of iterative reconstruction technique.  COMPARISON:  Head CT 10/31/22  FINDINGS: Brain: Compared to prior exam there is a new age indeterminate infarct in  the left occipital lobe (series 3, image 17). No hemorrhage. No hydrocephalus. No extra-axial fluid collection. No mass effect. No mass lesion.  Vascular: No hyperdense vessel or unexpected calcification.  Skull: Mild soft tissue swelling along the midline posterior scalp. Calvarial fracture. No focal bone lesion.  Sinuses/Orbits: No middle ear or mastoid effusion. Paranasal sinuses are notable for mucosal thickening in the right sphenoid and bilateral maxillary sinuses. Orbits are unremarkable.  Other: None.  IMPRESSION: New age indeterminate infarct in the left occipital lobe. Recommend brain MRI for further evaluation.  Electronically Signed   By: Lorenza Cambridge M.D.   On: 04/01/2023 10:02  Lab Results  Component Value Date   WBC 7.7 04/01/2023   HGB 18.0 (H) 04/01/2023   HCT 55.3 (H) 04/01/2023   MCV 85.3 04/01/2023   PLT 233 04/01/2023   Last metabolic panel Lab Results  Component Value Date   GLUCOSE 119 (H) 04/01/2023   NA 137 04/01/2023   K 3.7 04/01/2023   CL 101 04/01/2023   CO2 26 04/01/2023   BUN 19 04/01/2023   CREATININE 1.33 (H) 04/01/2023   GFRNONAA >60 04/01/2023   CALCIUM 9.0 04/01/2023   PROT 7.4 04/01/2023   ALBUMIN 3.7 04/01/2023   BILITOT 0.8 04/01/2023   ALKPHOS 70 04/01/2023   AST 17 04/01/2023   ALT 16 04/01/2023   ANIONGAP 10 04/01/2023    Assessment and Plan: * CVA (cerebral vascular accident) (HCC) Left-sided ear pain x 1 week with noted left-sided weakness and gait instability x 3 to 4 days Risk factors include age, obesity, testosterone use CT head w/ age indeterminate infarct in the left occipital lobe  Will plan for formal CVA eval including MRI brain, 2d ECHO, CTA head and neck, risk stratification labs  Neurology consult as appropriate  Follow   Hypogonadism in male On exogenous testosterone  Testosterone level 1280 (ULN 900)  Likely RF for CVA event  Hold for now  Monitor       Advance Care Planning:   Code  Status: Not on file   Consults: Neurology consult as clinically indicated   Family Communication: No family at the bedside   Severity of Illness: The appropriate patient status for this patient is INPATIENT. Inpatient status is judged to be reasonable and necessary in order to provide the required intensity of service to ensure the patient's safety. The patient's presenting symptoms, physical exam findings, and initial radiographic and laboratory data in the context of their chronic comorbidities is felt to place them at high risk for further clinical deterioration. Furthermore, it is not anticipated that the patient will be medically stable for discharge from the hospital within 2 midnights of admission.   * I certify that at the point of admission it is my clinical judgment that  the patient will require inpatient hospital care spanning beyond 2 midnights from the point of admission due to high intensity of service, high risk for further deterioration and high frequency of surveillance required.*  Author: Floydene Flock, MD 04/01/2023 2:39 PM  For on call review [email or web address].

## 2023-04-01 NOTE — Discharge Summary (Signed)
Physician Discharge Summary   Patient: Gavin Dorsey MRN: [id number] DOB: 08/27/1960  Admit date:     04/01/2023  Discharge date: 04/01/23  Discharge Physician: Floydene Flock   PCP: Simonne Martinet, MD   Recommendations at discharge:   Follow-up MRI imaging concerning for distal vertebral and basilar artery occlusion.  Case discussed with on-call neurologist Dr. Iver Nestle who is recommending urgent versus emergent transfer to [facility] for in person neurology evaluation as well as every 2 neurology checks overnight.  Patient has been accepted to a progressive bed in discussion with Dr. Adela Glimpse.  Will plan for acute 2-hour neurochecks overnight as well as formal in person neurology evaluation upon arrival and [facility].  CTA head neck pending prior to transfer.  Discharge Diagnoses: Principal Problem:   CVA (cerebral vascular accident) [facility]) Active Problems:   Hypogonadism in male   Hypertension   Polycythemia  Resolved Problems:   * No resolved hospital problems. *  Hospital Course: No notes on file  Assessment and Plan: * CVA (cerebral vascular accident) (HCC) Left-sided ear pain x 1 week with noted left-sided weakness and gait instability x 3 to 4 days Risk factors include age, obesity, testosterone use CT head w/ age indeterminate infarct in the left occipital lobe  Will plan for formal CVA eval including MRI brain, 2d ECHO, CTA head and neck, risk stratification labs  Neurology consult as appropriate  Follow   Polycythemia Hemoglobin 18 on presentation Suspect secondary to testosterone use Monitor  Hypertension BP 130s to 150s over 100s Allow for permissive hypertensive in setting of CVA eval  Prn IV labetalol for SBP>220 or DBP>110    Hypogonadism in male On exogenous testosterone  Testosterone level 1280 (ULN 900)  Likely RF for CVA event  Hold for now  Monitor          Consultants: Neurology  Procedures performed: None   Disposition:   [facility]  Diet recommendation:  Cardiac and Carb modified diet DISCHARGE MEDICATION:   Discharge Exam: Filed Weights   04/01/23 1304  Weight: 115.7 kg   Unchanged   Condition at discharge: stable  The results of significant diagnostics from this hospitalization (including imaging, microbiology, ancillary and laboratory) are listed below for reference.   Imaging Studies: MR BRAIN WO CONTRAST Addendum Date: 04/01/2023 ADDENDUM REPORT: 04/01/2023 17:00 ADDENDUM: Critical Value/emergent results were called by telephone at the time of interpretation on 04/01/2023 at 4:58 pm to provider Dr. Larinda Buttery, who verbally acknowledged these results. Results were also discussed with the stroke neurologist on-call, Dr. Iver Nestle, at 4:39 p.m. Electronically Signed   By: Orvan Falconer M.D.   On: 04/01/2023 17:00   Result Date: 04/01/2023 CLINICAL DATA:  Neuro deficit, acute, stroke suspected. Balance difficulty. EXAM: MRI HEAD WITHOUT CONTRAST TECHNIQUE: Multiplanar, multiecho pulse sequences of the brain and surrounding structures were obtained without intravenous contrast. COMPARISON:  Head CT 04/01/2023. FINDINGS: Brain: Scattered foci of acute and subacute infarction throughout the brainstem and left cerebellar hemisphere. Acute hemorrhage or significant mass effect. Background of mild chronic small-vessel disease. The previously questioned left occipital lobe infarct is chronic. No hydrocephalus or extra-axial collection. No mass or abnormal susceptibility Vascular: Loss of the distal vertebral and basilar artery flow voids, suspicious for occlusion. Skull and upper cervical spine: Normal marrow signal. Sinuses/Orbits: No acute findings. Other: None. IMPRESSION: 1. Scattered foci of acute and subacute infarction throughout the brainstem and left cerebellar hemisphere. 2. Loss of the distal vertebral and basilar artery flow voids, suspicious  for occlusion. CTA of the head and neck is recommended for confirmation.  Radiology assistant personnel have been notified to put me in telephone contact with the referring physician or the referring physician's clinical representative in order to discuss these findings. Once this communication is established I will issue an addendum to this report for documentation purposes. Electronically Signed: By: Orvan Falconer M.D. On: 04/01/2023 16:23   CT HEAD WO CONTRAST Result Date: 04/01/2023 CLINICAL DATA:  Left-sided ear pain EXAM: CT HEAD WITHOUT CONTRAST TECHNIQUE: Contiguous axial images were obtained from the base of the skull through the vertex without intravenous contrast. RADIATION DOSE REDUCTION: This exam was performed according to the departmental dose-optimization program which includes automated exposure control, adjustment of the mA and/or kV according to patient size and/or use of iterative reconstruction technique. COMPARISON:  Head CT 10/31/22 FINDINGS: Brain: Compared to prior exam there is a new age indeterminate infarct in the left occipital lobe (series 3, image 17). No hemorrhage. No hydrocephalus. No extra-axial fluid collection. No mass effect. No mass lesion. Vascular: No hyperdense vessel or unexpected calcification. Skull: Mild soft tissue swelling along the midline posterior scalp. Calvarial fracture. No focal bone lesion. Sinuses/Orbits: No middle ear or mastoid effusion. Paranasal sinuses are notable for mucosal thickening in the right sphenoid and bilateral maxillary sinuses. Orbits are unremarkable. Other: None. IMPRESSION: New age indeterminate infarct in the left occipital lobe. Recommend brain MRI for further evaluation. Electronically Signed   By: Lorenza Cambridge M.D.   On: 04/01/2023 10:02    Microbiology: Results for orders placed or performed in visit on 02/22/19  Novel Coronavirus, NAA ([facility])     Status: None   Collection Time: 02/22/19  3:37 PM   Specimen: Nasopharyngeal(NP) swabs in vial transport medium   NASOPHARYNGE  TESTING  Result  Value Ref Range Status   SARS-CoV-2, NAA Not Detected Not Detected Final    Comment: This nucleic acid amplification test was developed and its performance characteristics determined by [employer]. Nucleic acid amplification tests include PCR and TMA. This test has not been FDA cleared or approved. This test has been authorized by FDA under an Emergency Use Authorization (EUA). This test is only authorized for the duration of time the declaration that circumstances exist justifying the authorization of the emergency use of in vitro diagnostic tests for detection of SARS-CoV-2 virus and/or diagnosis of COVID-19 infection under section 564(b)(1) of the Act, 21 U.S.C. 409WJX-9(J) (1), unless the authorization is terminated or revoked sooner. When diagnostic testing is negative, the possibility of a false negative result should be considered in the context of a patient's recent exposures and the presence of clinical signs and symptoms consistent with COVID-19. An individual without symptoms of COVID-19 and who is not shedding SARS-CoV-2 virus would  expect to have a negative (not detected) result in this assay.     Labs: CBC: Recent Labs  Lab 04/01/23 0828  WBC 7.7  NEUTROABS 4.2  HGB 18.0*  HCT 55.3*  MCV 85.3  PLT 233   Basic Metabolic Panel: Recent Labs  Lab 04/01/23 0828  NA 137  K 3.7  CL 101  CO2 26  GLUCOSE 119*  BUN 19  CREATININE 1.33*  CALCIUM 9.0   Liver Function Tests: Recent Labs  Lab 04/01/23 0828  AST 17  ALT 16  ALKPHOS 70  BILITOT 0.8  PROT 7.4  ALBUMIN 3.7   CBG: No results for input(s): "GLUCAP" in the last 168 hours.  Discharge time spent: greater than  30 minutes.  Signed: Floydene Flock, MD [facility] 04/01/2023

## 2023-04-01 NOTE — ED Notes (Signed)
See triage note  Presents with some left sided weakness which started on Saturday States he noticed some weakness and states his gait was off on Saturday  Family states that he speech was off slightly but has improved  He also has had a hard time with lift leg leg  Grips are equal But he also is complaining of some left ear problems for over 1 week  with dizziness

## 2023-04-01 NOTE — Assessment & Plan Note (Signed)
Hemoglobin 18 on presentation Suspect secondary to testosterone use Monitor

## 2023-04-01 NOTE — Assessment & Plan Note (Addendum)
BP 130s to 150s over 100s Allow for permissive hypertensive in setting of CVA eval  Prn IV labetalol for SBP>220 or DBP>110

## 2023-04-01 NOTE — ED Provider Notes (Signed)
[facility] Provider Note    Event Date/Time   First MD Initiated Contact with Patient 04/01/23 1228     (approximate)   History   No chief complaint on file.   HPI  DOVID ZACCAGNINI is a 62 year old male with history of diet-controlled diabetes, HTN presenting to the emergency department for evaluation of balance difficulty.  Patient notes that 2 weeks ago he developed some fullness in his left ear.  He shortly after began to notice worsening difficulty when walking that has been persistent.  Denied speech changes to me, but triage note documents concerns for slurred and slower speech.  No anticoagulation.  Does report he is on testosterone replacement.  Reports intermittent weakness of his left side.  Denies history of prior CVA.  Takes a baby aspirin daily.     Physical Exam   Triage Vital Signs: ED Triage Vitals  Encounter Vitals Group     BP 04/01/23 0826 (!) 130/102     Systolic BP Percentile --      Diastolic BP Percentile --      Pulse Rate 04/01/23 0828 86     Resp 04/01/23 0826 20     Temp 04/01/23 0826 98 F (36.7 C)     Temp Source 04/01/23 0826 Oral     SpO2 04/01/23 0826 98 %     Weight 04/01/23 1304 255 lb 1.2 oz (115.7 kg)     Height 04/01/23 1304 5\' 8"  (1.727 m)     Head Circumference --      Peak Flow --      Pain Score 04/01/23 0827 3     Pain Loc --      Pain Education --      Exclude from Growth Chart --     Most recent vital signs: Vitals:   04/01/23 1301 04/01/23 1312  BP: (!) 150/110   Pulse: 88   Resp: 20   Temp:  98 F (36.7 C)  SpO2: 98%      General: Awake, interactive  HEENT: TMs clear bilaterally CV:  Regular rate, good peripheral perfusion.  Resp:  Unlabored respirations, lungs clear to auscultation Abd:  Nondistended.  Neuro:  Keenly aware, correctly answers month and age, able to blink eyes and squeeze hands, normal horizontal extraocular movements, no visual field loss, normal facial symmetry, no arm  or leg motor drift, no limb ataxia, normal sensation, no aphasia, no dysarthria, no inattention, significant instability even when standing and attempting to take a step, did not feel that I could further safely ambulate patient with 1 person due to his instability   ED Results / Procedures / Treatments   Labs (all labs ordered are listed, but only abnormal results are displayed) Labs Reviewed  CBC - Abnormal; Notable for the following components:      Result Value   RBC 6.48 (*)    Hemoglobin 18.0 (*)    HCT 55.3 (*)    RDW 16.7 (*)    All other components within normal limits  COMPREHENSIVE METABOLIC PANEL - Abnormal; Notable for the following components:   Glucose, Bld 119 (*)    Creatinine, Ser 1.33 (*)    All other components within normal limits  PROTIME-INR  APTT  DIFFERENTIAL  ETHANOL  TESTOSTERONE  HEMOGLOBIN A1C  I-STAT CREATININE, ED  CBG MONITORING, ED     EKG EKG independently reviewed interpreted by myself (ER attending) demonstrates:  EKG demonstrates normal sinus and the rate of 92,  PR 184, QRS 96, QTc 460, no acute ST changes  RADIOLOGY Imaging independently reviewed and interpreted by myself demonstrates:  CT head without bleed, radiology notes age-indeterminate infarct in the left occipital lobe  PROCEDURES:  Critical Care performed: No  Procedures   MEDICATIONS ORDERED IN ED: Medications  sodium chloride flush (NS) 0.9 % injection 3 mL ( Intravenous Canceled Entry 04/01/23 0836)   stroke: early stages of recovery book (has no administration in time range)     IMPRESSION / MDM / ASSESSMENT AND PLAN / ED COURSE  I reviewed the triage vital signs and the nursing notes.  Differential diagnosis includes, but is not limited to, CVA, peripheral vertigo, intracranial mass, intracranial bleed  Patient's presentation is most consistent with acute presentation with potential threat to life or bodily function.  61 year old male presenting to the  emergency department for evaluation of balance difficulties.  CT head concerning for an occipital stroke, clinical history concerning for subacute stroke.  Outside window for intervention.  Significant gait instability here.  Do think patient is appropriate for further evaluation of suspected stroke.  Stroke order set initiated.  Will reach out to hospitalist team.  Case reviewed with Dr. Alvester Morin.  He will evaluate the patient for anticipated admission.     FINAL CLINICAL IMPRESSION(S) / ED DIAGNOSES   Final diagnoses:  Gait instability  Occipital stroke (HCC)     Rx / DC Orders   ED Discharge Orders     None        Note:  This document was prepared using Dragon voice recognition software and may include unintentional dictation errors.   Trinna Post, MD 04/01/23 1430

## 2023-04-01 NOTE — Assessment & Plan Note (Signed)
On exogenous testosterone  Testosterone level 1280 (ULN 900)  Likely RF for CVA event  Hold for now  Monitor

## 2023-04-01 NOTE — ED Triage Notes (Signed)
Pt to ED via POV from home. Pt reports left sided ear pain x1wk. Family concerned pt has had a stroke due to speech changes (slurred and slower) x1wk.  Pt reports feeling off balance. Pt denies blood thinners. Pt on Testerone replacement. Pt denies numbness/tingling.

## 2023-04-01 NOTE — ED Notes (Signed)
Received call back from [facility] @ 2152 with bed assignment for patient [facility] [address]17 call report to [phone]. Per [facility] there are no trucks available will call when on is put in route

## 2023-04-02 ENCOUNTER — Inpatient Hospital Stay (HOSPITAL_COMMUNITY): Payer: Medicaid Other

## 2023-04-02 DIAGNOSIS — I639 Cerebral infarction, unspecified: Secondary | ICD-10-CM

## 2023-04-02 DIAGNOSIS — I651 Occlusion and stenosis of basilar artery: Principal | ICD-10-CM

## 2023-04-02 DIAGNOSIS — I6329 Cerebral infarction due to unspecified occlusion or stenosis of other precerebral arteries: Secondary | ICD-10-CM

## 2023-04-02 DIAGNOSIS — I6389 Other cerebral infarction: Secondary | ICD-10-CM

## 2023-04-02 HISTORY — PX: IR ANGIO VERTEBRAL SEL SUBCLAVIAN INNOMINATE UNI L MOD SED: IMG5364

## 2023-04-02 HISTORY — PX: IR ANGIO INTRA EXTRACRAN SEL COM CAROTID INNOMINATE BILAT MOD SED: IMG5360

## 2023-04-02 HISTORY — PX: IR ANGIO VERTEBRAL SEL VERTEBRAL UNI R MOD SED: IMG5368

## 2023-04-02 HISTORY — PX: IR US GUIDE VASC ACCESS RIGHT: IMG2390

## 2023-04-02 LAB — CBC
HCT: 50.1 % (ref 39.0–52.0)
Hemoglobin: 16.8 g/dL (ref 13.0–17.0)
MCH: 28.2 pg (ref 26.0–34.0)
MCHC: 33.5 g/dL (ref 30.0–36.0)
MCV: 84.1 fL (ref 80.0–100.0)
Platelets: 229 10*3/uL (ref 150–400)
RBC: 5.96 MIL/uL — ABNORMAL HIGH (ref 4.22–5.81)
RDW: 16.5 % — ABNORMAL HIGH (ref 11.5–15.5)
WBC: 8 10*3/uL (ref 4.0–10.5)
nRBC: 0 % (ref 0.0–0.2)

## 2023-04-02 LAB — ECHOCARDIOGRAM COMPLETE
Area-P 1/2: 3.42 cm2
Height: 68 in
S' Lateral: 2.9 cm
Weight: 3559.11 [oz_av]

## 2023-04-02 LAB — LIPID PANEL
Cholesterol: 148 mg/dL (ref 0–200)
HDL: 27 mg/dL — ABNORMAL LOW (ref >40)
LDL Cholesterol: 101 mg/dL — ABNORMAL HIGH (ref 0–99)
Total CHOL/HDL Ratio: 5.5 {ratio}
Triglycerides: 100 mg/dL (ref <150)
VLDL: 20 mg/dL (ref 0–40)

## 2023-04-02 LAB — CREATININE, SERUM
Creatinine, Ser: 1.32 mg/dL — ABNORMAL HIGH (ref 0.61–1.24)
GFR, Estimated: >60 mL/min (ref >60)

## 2023-04-02 LAB — HIV ANTIBODY (ROUTINE TESTING W REFLEX): HIV Screen 4th Generation wRfx: NONREACTIVE

## 2023-04-02 LAB — MRSA NEXT GEN BY PCR, NASAL: MRSA by PCR Next Gen: NOT DETECTED

## 2023-04-02 MED ORDER — VERAPAMIL HCL 2.5 MG/ML IV SOLN
INTRAVENOUS | Status: AC
Start: 2023-04-02 — End: ?
  Filled 2023-04-02: qty 2

## 2023-04-02 MED ORDER — STROKE: EARLY STAGES OF RECOVERY BOOK
Freq: Once | Status: AC
Start: 2023-04-03 — End: 2023-04-03
  Filled 2023-04-02: qty 1

## 2023-04-02 MED ORDER — NITROGLYCERIN 1 MG/10 ML FOR IR/CATH LAB
INTRA_ARTERIAL | Status: AC
  Filled 2023-04-02: qty 10

## 2023-04-02 MED ORDER — FENTANYL CITRATE (PF) 100 MCG/2ML IJ SOLN
INTRAMUSCULAR | Status: AC
  Filled 2023-04-02: qty 2

## 2023-04-02 MED ORDER — SENNOSIDES-DOCUSATE SODIUM 8.6-50 MG PO TABS: 1.0000 | ORAL_TABLET | Freq: Every evening | ORAL | Status: DC | PRN

## 2023-04-02 MED ORDER — MIDAZOLAM HCL 2 MG/2ML IJ SOLN
INTRAMUSCULAR | Status: AC
  Filled 2023-04-02: qty 2

## 2023-04-02 MED ORDER — LIDOCAINE HCL 1 % IJ SOLN
10.0000 mL | Freq: Once | INTRAMUSCULAR | Status: AC
  Administered 2023-04-02: 10 mL via INTRADERMAL
  Filled 2023-04-02: qty 10

## 2023-04-02 MED ORDER — IOHEXOL 300 MG/ML  SOLN
150.0000 mL | Freq: Once | INTRAMUSCULAR | Status: AC | PRN
  Administered 2023-04-02: 75 mL via INTRA_ARTERIAL

## 2023-04-02 MED ORDER — CLOPIDOGREL BISULFATE 75 MG PO TABS
75.0000 mg | ORAL_TABLET | Freq: Every day | ORAL | Status: DC
  Administered 2023-04-02: 75 mg via ORAL
  Filled 2023-04-02: qty 1

## 2023-04-02 MED ORDER — SODIUM CHLORIDE 0.9 % IV SOLN: INTRAVENOUS | Status: AC

## 2023-04-02 MED ORDER — ORAL CARE MOUTH RINSE: 15.0000 mL | OROMUCOSAL | Status: DC | PRN

## 2023-04-02 MED ORDER — ENOXAPARIN SODIUM 40 MG/0.4ML IJ SOSY
40.0000 mg | PREFILLED_SYRINGE | INTRAMUSCULAR | Status: DC
  Administered 2023-04-02 – 2023-04-03 (×2): 40 mg via SUBCUTANEOUS
  Filled 2023-04-02 (×2): qty 0.4

## 2023-04-02 MED ORDER — FENTANYL CITRATE (PF) 100 MCG/2ML IJ SOLN
INTRAMUSCULAR | Status: AC | PRN
  Administered 2023-04-02: 25 ug via INTRAVENOUS

## 2023-04-02 MED ORDER — MIDAZOLAM HCL 2 MG/2ML IJ SOLN
INTRAMUSCULAR | Status: AC | PRN
  Administered 2023-04-02: 1 mg via INTRAVENOUS

## 2023-04-02 MED ORDER — ACETAMINOPHEN 325 MG PO TABS: 650.0000 mg | ORAL_TABLET | ORAL | Status: DC | PRN

## 2023-04-02 MED ORDER — SODIUM CHLORIDE 0.9 % IV SOLN
INTRAVENOUS | Status: DC
Start: 2023-04-02 — End: 2023-04-02

## 2023-04-02 MED ORDER — NITROGLYCERIN 1 MG/10 ML FOR IR/CATH LAB
INTRA_ARTERIAL | Status: AC | PRN
  Administered 2023-04-02: 200 ug

## 2023-04-02 MED ORDER — VERAPAMIL HCL 2.5 MG/ML IV SOLN: INTRA_ARTERIAL | Status: AC | PRN

## 2023-04-02 MED ORDER — HEPARIN SODIUM (PORCINE) 1000 UNIT/ML IJ SOLN
INTRAMUSCULAR | Status: AC
  Filled 2023-04-02: qty 10

## 2023-04-02 MED ORDER — ACETAMINOPHEN 160 MG/5ML PO SOLN: 650.0000 mg | ORAL | Status: DC | PRN

## 2023-04-02 MED ORDER — ACETAMINOPHEN 650 MG RE SUPP: 650.0000 mg | RECTAL | Status: DC | PRN

## 2023-04-02 MED ORDER — ASPIRIN 81 MG PO TBEC
81.0000 mg | DELAYED_RELEASE_TABLET | Freq: Every day | ORAL | Status: DC
  Administered 2023-04-02 – 2023-04-04 (×3): 81 mg via ORAL
  Filled 2023-04-02 (×3): qty 1

## 2023-04-02 MED ORDER — ROSUVASTATIN CALCIUM 20 MG PO TABS
40.0000 mg | ORAL_TABLET | Freq: Every day | ORAL | Status: DC
  Administered 2023-04-02 – 2023-04-06 (×4): 40 mg via ORAL
  Filled 2023-04-02 (×4): qty 2

## 2023-04-02 MED ORDER — CHLORHEXIDINE GLUCONATE CLOTH 2 % EX PADS
6.0000 | MEDICATED_PAD | Freq: Every day | CUTANEOUS | Status: DC
  Administered 2023-04-02 – 2023-04-05 (×3): 6 via TOPICAL

## 2023-04-02 NOTE — Progress Notes (Signed)
SLP Cancellation Note  Patient Details Name: Gavin Dorsey MRN: [id number] DOB: 1960-06-30   Cancelled treatment:       Reason Eval/Treat Not Completed: Patient at procedure or test/unavailable SLP will follow.  Angela Nevin, MA, CCC-SLP Speech Therapy

## 2023-04-02 NOTE — Evaluation (Signed)
Occupational Therapy Evaluation Patient Details Name: Gavin Dorsey MRN: [id number] DOB: [birth year] Today's Date: 04/02/2023   History of Present Illness Gavin Dorsey is a 62 yo male who presented with 2 week hx of L ear pain, dysequilibrium, and feeling off balance. MRI Brain and CTA with scattered brainstem and left cerebellar infarcts.  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 Cerebral angiogram planned for 12/18. PMHx: DM II, HTN   Clinical Impression   Gavin Dorsey was evaluated s/p the above admission list. He is indep and lives alone at baseline. Upon evaluation the pt was limited by L ear pain, unsteady gait, limited activity tolerance and BLE incoordination. Overall he needed CGA-superivsion A for all aspects of mobility with RW to increase safety. Due to the deficits listed below the pt also needs up to CGA for LB ADLs with cues for safety. Pt will benefit from continued acute OT services and discharge home with support of family and friends, do not anticipate pt will need follow up OT.        If plan is discharge home, recommend the following: Assistance with cooking/housework;Assist for transportation    Functional Status Assessment  Patient has had a recent decline in their functional status and demonstrates the ability to make significant improvements in function in a reasonable and predictable amount of time.  Equipment Recommendations  Other (comment) (pending acute progress; RW)       Precautions / Restrictions Precautions Precautions: Fall Restrictions Weight Bearing Restrictions Per Provider Order: No      Mobility Bed Mobility Overal bed mobility: Needs Assistance Bed Mobility: Supine to Sit     Supine to sit: Min assist          Transfers Overall transfer level: Needs assistance Equipment used: Rolling walker (2 wheels) Transfers: Sit to/from Stand Sit to Stand: Contact guard assist                  Balance Overall balance  assessment: Needs assistance Sitting-balance support: Feet supported Sitting balance-Leahy Scale: Good     Standing balance support: No upper extremity supported, During functional activity Standing balance-Leahy Scale: Fair Standing balance comment: statically                           ADL either performed or assessed with clinical judgement   ADL Overall ADL's : Needs assistance/impaired Eating/Feeding: NPO   Grooming: Supervision/safety;Standing   Upper Body Bathing: Set up;Sitting   Lower Body Bathing: Contact guard assist;Sit to/from stand   Upper Body Dressing : Set up;Sitting   Lower Body Dressing: Contact guard assist;Sit to/from stand   Toilet Transfer: Contact guard assist;Ambulation Toilet Transfer Details (indicate cue type and reason): stood to urinate Toileting- Architect and Hygiene: Supervision/safety       Functional mobility during ADLs: Contact guard assist;Rolling walker (2 wheels) General ADL Comments: no physcial assist required, no overt LOB. RW used for safety.     Vision Baseline Vision/History: 0 No visual deficits Vision Assessment?: No apparent visual deficits     Perception Perception: Within Functional Limits       Praxis Praxis: WFL       Pertinent Vitals/Pain Pain Assessment Pain Assessment: Faces Faces Pain Scale: Hurts little more Pain Location: L ear Pain Descriptors / Indicators: Discomfort, Grimacing Pain Intervention(s): Limited activity within patient's tolerance, Monitored during session     Extremity/Trunk Assessment Upper Extremity Assessment Upper Extremity Assessment: Overall WFL for tasks assessed  Lower Extremity Assessment Lower Extremity Assessment: Defer to PT evaluation   Cervical / Trunk Assessment Cervical / Trunk Assessment: Normal   Communication Communication Communication: No apparent difficulties   Cognition Arousal: Alert Behavior During Therapy: WFL for tasks  assessed/performed Overall Cognitive Status: Within Functional Limits for tasks assessed               General Comments  Pt on 3L on arrival, removed for session, >92% on RA            Home Living Family/patient expects to be discharged to:: Private residence Living Arrangements: Alone Available Help at Discharge: Family;Friend(s);Neighbor;Available PRN/intermittently Type of Home: Apartment Home Access: Level entry     Home Layout: Two level;Bed/bath upstairs Alternate Level Stairs-Number of Steps: flight   Bathroom Shower/Tub: Producer, television/film/video: Standard     Home Equipment: None          Prior Functioning/Environment Prior Level of Function : Independent/Modified Independent;Driving             Mobility Comments: no AD, one fall due to recent symptoms ADLs Comments: indep, does not work        OT Problem List: Decreased coordination      OT Treatment/Interventions: Self-care/ADL training;Therapeutic exercise;DME and/or AE instruction;Therapeutic activities;Patient/family education;Balance training    OT Goals(Current goals can be found in the care plan section) Acute Rehab OT Goals Patient Stated Goal: home OT Goal Formulation: With patient Time For Goal Achievement: 04/16/23 Potential to Achieve Goals: Good ADL Goals Additional ADL Goal #1: Pt will complete all ADLs independently Additional ADL Goal #2: Pt well indep reall at least 3 fall prevention strategies to increased safety in the home setting  OT Frequency: Min 1X/week       AM-PAC OT "6 Clicks" Daily Activity     Outcome Measure Help from another person eating meals?: None Help from another person taking care of personal grooming?: A Little Help from another person toileting, which includes using toliet, bedpan, or urinal?: A Little Help from another person bathing (including washing, rinsing, drying)?: A Little Help from another person to put on and taking off regular  upper body clothing?: A Little Help from another person to put on and taking off regular lower body clothing?: A Little 6 Click Score: 19   End of Session Equipment Utilized During Treatment: Gait belt;Rolling walker (2 wheels) Nurse Communication: Mobility status  Activity Tolerance: Patient tolerated treatment well Patient left: in bed;with call bell/phone within reach;with bed alarm set  OT Visit Diagnosis: Unsteadiness on feet (R26.81);Other abnormalities of gait and mobility (R26.89);Muscle weakness (generalized) (M62.81);History of falling (Z91.81)                Time: 0454-0981 OT Time Calculation (min): 19 min Charges:  OT General Charges $OT Visit: 1 Visit OT Evaluation $OT Eval Moderate Complexity: 1 Mod  Derenda Mis, OTR/L Acute Rehabilitation Services Office [phone] Secure Chat Communication Preferred   Donia Pounds 04/02/2023, 11:16 AM

## 2023-04-02 NOTE — Procedures (Signed)
INR.  Status post bilateral common carotid arteriograms, right vertebral artery angiogram, and left subclavian arteriogram.  Right radial approach.  Findings.  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  Distal one third of the basilar artery opacifies  retrogradely via a severely diseased left posterior communicating artery.  2.  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  S.Areta Terwilliger MD

## 2023-04-02 NOTE — Progress Notes (Signed)
PT Cancellation Note  Patient Details Name: Gavin Dorsey MRN: [id number] DOB: 08/13/1960   Cancelled Treatment:    Reason Eval/Treat Not Completed: Patient at procedure or test/unavailable.  Pt has gone to an angiogram and will be on bedrest after into the evening.  Will see as appropriate 12/19. 04/02/2023  Jacinto Halim., PT Acute Rehabilitation Services [phone]  (office)   Eliseo Gum Clint Strupp 04/02/2023, 2:54 PM

## 2023-04-02 NOTE — Plan of Care (Signed)
  Problem: Education: Goal: Knowledge of General Education information will improve Description: Including pain rating scale, medication(s)/side effects and non-pharmacologic comfort measures Outcome: Progressing   Problem: Health Behavior/Discharge Planning: Goal: Ability to manage health-related needs will improve Outcome: Progressing   Problem: Clinical Measurements: Goal: Ability to maintain clinical measurements within normal limits will improve Outcome: Progressing Goal: Will remain free from infection Outcome: Progressing Goal: Diagnostic test results will improve Outcome: Progressing Goal: Cardiovascular complication will be avoided Outcome: Progressing   Problem: Activity: Goal: Risk for activity intolerance will decrease Outcome: Progressing   Problem: Nutrition: Goal: Adequate nutrition will be maintained Outcome: Progressing   Problem: Coping: Goal: Level of anxiety will decrease Outcome: Progressing   Problem: Elimination: Goal: Will not experience complications related to urinary retention Outcome: Progressing   Problem: Safety: Goal: Ability to remain free from injury will improve Outcome: Progressing   Problem: Skin Integrity: Goal: Risk for impaired skin integrity will decrease Outcome: Progressing   Problem: Education: Goal: Knowledge of disease or condition will improve Outcome: Progressing Goal: Knowledge of secondary prevention will improve (MUST DOCUMENT ALL) Outcome: Progressing Goal: Knowledge of patient specific risk factors will improve Loraine Leriche N/A or DELETE if not current risk factor) Outcome: Progressing   Problem: Ischemic Stroke/TIA Tissue Perfusion: Goal: Complications of ischemic stroke/TIA will be minimized Outcome: Progressing   Problem: Coping: Goal: Will verbalize positive feelings about self Outcome: Progressing Goal: Will identify appropriate support needs Outcome: Progressing   Problem: Health Behavior/Discharge  Planning: Goal: Ability to manage health-related needs will improve Outcome: Progressing Goal: Goals will be collaboratively established with patient/family Outcome: Progressing   Problem: Self-Care: Goal: Ability to participate in self-care as condition permits will improve Outcome: Progressing Goal: Verbalization of feelings and concerns over difficulty with self-care will improve Outcome: Progressing Goal: Ability to communicate needs accurately will improve Outcome: Progressing   Problem: Nutrition: Goal: Risk of aspiration will decrease Outcome: Progressing Goal: Dietary intake will improve Outcome: Progressing

## 2023-04-02 NOTE — Progress Notes (Signed)
  Echocardiogram 2D Echocardiogram has been performed.  Delcie Roch 04/02/2023, 5:05 PM

## 2023-04-02 NOTE — H&P (Signed)
NEUROLOGY H&P NOTE   Date of service: April 02, 2023 Patient Name: Gavin Dorsey MRN:  [id number] DOB:  [birth year] Chief Complaint: "vertigo, off balance, left leg weakness"  History of Present Illness  Gavin Dorsey is a 62 y.o. male with hx of diabetes, HTN who presents with about 2 week hx of L ear pain, tinnitus and pain behind his L ear which is improving. He reports that this started afer he went to a shooting range, a couple days after thanksgiving.  Since Saturday, he has noticed some trouble with his walking, he is off balance, reports dysequilibrium, almost fell but slid down and did not hit his head on the ground. The gait instability has been persistent so he came to the ED.  CT head with no acute intracranial abnormalities, MRI Brain and CTA with scattered brainstem and left cerebellar infarcts.  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  Patient was loaded with aspirin and Plavix and transferred to [facility] neuro ICU for close monitoring of any posterior circulation symptoms.  He does not smoke, does not use any recreational substances, drinks alcohol only socially.  He reports that over the last year and a half he is lost over 100 pounds by eating a Mediterranean diet and walking and exercising.   Last known well: 03/29/23 Modified rankin score: 0-Completely asymptomatic and back to baseline post- stroke IV Thrombolysis: not offered, outside window and too mild to treat, Thrombectomy: not offered, outside window and too mild to treat. NIHSS components Score: Comment  1a Level of Conscious 0[x]  1[]  2[]  3[]      1b LOC Questions 0[x]  1[]  2[]       1c LOC Commands 0[x]  1[]  2[]       2 Best Gaze 0[x]  1[]  2[]       3 Visual 0[x]  1[]  2[]  3[]      4 Facial Palsy 0[x]  1[]  2[]  3[]      5a Motor Arm - left 0[x]  1[]  2[]  3[]  4[]   UN[]    5b Motor Arm - Right 0[x]  1[]  2[]  3[]  4[]  UN[]    6a Motor Leg - Left 0[x]  1[]  2[]  3[]  4[]  UN[]    6b Motor Leg - Right 0[x]  1[]  2[]  3[]  4[]  UN[]    7 Limb Ataxia 0[]  1[x]  2[]  3[]  UN[]     8 Sensory 0[x]  1[]  2[]  UN[]      9 Best Language 0[x]  1[]  2[]  3[]      10 Dysarthria 0[x]  1[]  2[]  UN[]      11 Extinct. and Inattention 0[x]  1[]  2[]       TOTAL: 1      ROS  Comprehensive ROS performed and pertinent positives documented in the HPI  Past History   Past Medical History:  Diagnosis Date   Diabetes mellitus without complication (HCC)    Hypertension    No past surgical history on file. No family history on file. Social History   Socioeconomic History   Marital status: Single    Spouse name: Not on file   Number of children: Not on file   Years of education: Not on file   Highest education level: Not on file  Occupational History   Not on file  Tobacco Use   Smoking status: Never   Smokeless tobacco: Never  Substance and Sexual Activity   Alcohol use: Yes   Drug use: Never   Sexual activity: Yes  Other Topics  Concern   Not on file  Social History Narrative   Not on file   Social Drivers of Health   Financial Resource Strain: Not on file  Food Insecurity: Not on file  Transportation Needs: Not on file  Physical Activity: Not on file  Stress: Not on file  Social Connections: Not on file   No Known Allergies  Medications  No current facility-administered medications for this encounter.   Vitals   Vitals:   04/01/23 2359  Temp: 98.4 F (36.9 C)  TempSrc: Oral  Weight: 100.9 kg     Body mass index is 33.82 kg/m.  Physical Exam   General: Laying comfortably in bed; in no acute distress.  HENT: Normal oropharynx and mucosa. Normal external appearance of ears and nose.  Neck: Supple, no pain or tenderness  CV: No JVD. No peripheral edema.  Pulmonary: Symmetric Chest rise. Normal respiratory effort.  Abdomen: Soft to touch, non-tender.  Ext: No  cyanosis, edema, or deformity  Skin: No rash. Normal palpation of skin.   Musculoskeletal: Normal digits and nails by inspection. No clubbing.   Neurologic Examination  Mental status/Cognition: Alert, oriented to self, place, month and year, good attention.  Speech/language: Fluent, comprehension intact, object naming intact, repetition intact.  Cranial nerves:   CN II Pupils equal and reactive to light, no VF deficits    CN III,IV,VI EOM intact, no gaze preference or deviation, no nystagmus    CN V normal sensation in V1, V2, and V3 segments bilaterally    CN VII no asymmetry, no nasolabial fold flattening    CN VIII normal hearing to speech    CN IX & X normal palatal elevation, no uvular deviation    CN XI 5/5 head turn and 5/5 shoulder shrug bilaterally    CN XII midline tongue protrusion    Motor:  Muscle bulk: normal, tone normal, pronator drift none tremor none Mvmt Root Nerve  Muscle Right Left Comments  SA C5/6 Ax Deltoid 5 5   EF C5/6 Mc Biceps 5 5   EE C6/7/8 Rad Triceps 5 5   WF C6/7 Med FCR     WE C7/8 PIN ECU     F Ab C8/T1 U ADM/FDI 5 5   HF L1/2/3 Fem Illopsoas 5 4   KE L2/3/4 Fem Quad 5 5   DF L4/5 D Peron Tib Ant 5 5   PF S1/2 Tibial Grc/Sol 5 5    Sensation:  Light touch Intact throughout   Pin prick    Temperature    Vibration   Proprioception    Coordination/Complex Motor:  - Finger to Nose intact bilaterally - Heel to shin mild left lower extremity ataxia. - Rapid alternating movement are slowed in left lower extremity. - Gait: Deferred for patient's safety.   Labs   CBC:  Recent Labs  Lab 04/01/23 0828  WBC 7.7  NEUTROABS 4.2  HGB 18.0*  HCT 55.3*  MCV 85.3  PLT 233   Basic Metabolic Panel:  Lab Results  Component Value Date   NA 137 04/01/2023   K 3.7 04/01/2023   CO2 26 04/01/2023   GLUCOSE 119 (H) 04/01/2023   BUN 19 04/01/2023   CREATININE 1.33 (H) 04/01/2023   CALCIUM 9.0 04/01/2023   GFRNONAA >60 04/01/2023   GFRAA >60  07/19/2016   Lipid Panel: No results found for: "LDLCALC" HgbA1c:  Lab Results  Component Value Date   HGBA1C 5.2 04/01/2023   Urine Drug Screen: No results found  for: "LABOPIA", "COCAINSCRNUR", "LABBENZ", "AMPHETMU", "THCU", "LABBARB"  Alcohol Level     Component Value Date/Time   ETH <10 04/01/2023 0828   INR  Lab Results  Component Value Date   INR 1.0 04/01/2023   APTT  Lab Results  Component Value Date   APTT 31 04/01/2023     CT Head without contrast(Personally reviewed): CTH was negative for a large hypodensity concerning for a large territory infarct or hyperdensity concerning for an ICH  CT angio Head and Neck with contrast(Personally reviewed): 1. Nonopacification of the left vertebral artery from its origin through the vertebrobasilar junction, which may be chronic. 2. Poor opacification of the remainder of the posterior circulation, with fairly robust opacification the proximal right V4 and P1 segment, but poor opacification of the distal right V4, basilar, and more distal PCAs. 3. Moderate stenosis in the proximal right supraclinoid ICA and mild stenosis in the proximal left supraclinoid ICA. 4. No hemodynamically significant stenosis in the neck.  MRI Brain(Personally reviewed): 1. Scattered foci of acute and subacute infarction throughout the brainstem and left cerebellar hemisphere. 2. Loss of the distal vertebral and basilar artery flow voids, suspicious for occlusion. CTA of the head and neck is recommended for confirmation.  Assessment   Gavin Dorsey is a 62 y.o. male with hx of diabetes, HTN who presents with about 2 week hx of L ear pain, tinnitus and pain behind his L ear which is improving. In addition, he reports about 4-day history of disequilibrium and slid off but did not hit his head.  His disequilibrium was persistent and so he came into the hospital where further workup with CT head, MRI brain and CT angio head and neck demonstrated  scattered acute/subacute brainstem infarcts along with left cerebellar infarcts along with left vertebral artery occlusion along with proximal V4, basilar and more distal PCA poor opacification.   He was given aspirin and Plavix and transferred to neuro ICU at [facility] for close monitoring.  Recommendations  - Frequent Neuro checks per stroke unit protocol - Recommend obtaining TTE  - Recommend obtaining Lipid panel with LDL - Please start statin if LDL > 70 - Recommend HbA1c to evaluate for diabetes and how well it is controlled. - Antithrombotic -aspirin 325 along with Plavix 300 mg p.o. once, followed by aspirin 81 mg daily and Plavix 75 mg daily for 90 days, followed by aspirin 81 mg daily alone. - Recommend DVT ppx - SBP goal - permissive hypertension first 24 h < 220/110. Held home meds.  - Recommend Telemetry monitoring for arrythmia - Recommend bedside swallow screen prior to PO intake. - Stroke education booklet - Recommend PT/OT/SLP consult  ______________________________________________________________________  CODE STATUS verified and patient is DNR/DNI.  Allergies verified and patient has no known allergies. Patient would like his cousin Ginger Cubitt to be HCPOA if he is unable to make medical decision by himself.  This patient is critically ill and at significant risk of neurological worsening, death and care requires constant monitoring of vital signs, hemodynamics,respiratory and cardiac monitoring, neurological assessment, discussion with family, other specialists and medical decision making of high complexity. I spent 40 minutes of neurocritical care time  in the care of  this patient. This was time spent independent of any time provided by nurse practitioner or PA.  Erick Blinks [facility] 04/02/2023  2:08 AM  Signed, Erick Blinks, MD [facility]

## 2023-04-02 NOTE — Consult Note (Signed)
Chief Complaint: Patient was seen in consultation today for cerebral angiogram   Referring Physician(s): Newton,Steven J  Supervising Physician: Julieanne Cotton  Patient Status: [facility] - In-pt  History of Present Illness: Gavin Dorsey is a 62 y.o. male with hx of diabetes, HTN who presents with about 2 week hx of L ear pain, tinnitus and pain behind his L ear which is improving. He reports that this started afer he went to a shooting range. 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  NIR is consulted for cerebral angiogram.  PMHx, meds, labs, imaging, allergies reviewed. Feels okay this am. Has been NPO today as directed.   Past Medical History:  Diagnosis Date   Diabetes mellitus without complication (HCC)    Hypertension     No past surgical history on file.  Allergies: Patient has no known allergies.  Medications:  Current Facility-Administered Medications:    [START ON 04/03/2023]  stroke: early stages of recovery book, , Does not apply, Once, Erick Blinks, MD   0.9 %  sodium chloride infusion, , Intravenous, Continuous, Shafer, Devon, NP, Stopped at 04/02/23 0945   acetaminophen (TYLENOL) tablet 650 mg, 650 mg, Oral, Q4H PRN **OR** acetaminophen (TYLENOL) 160 MG/5ML solution 650 mg, 650 mg, Per Tube, Q4H PRN **OR** acetaminophen (TYLENOL) suppository 650 mg, 650 mg, Rectal, Q4H PRN, Erick Blinks, MD   aspirin EC tablet 81 mg, 81 mg, Oral, Daily, Erick Blinks, MD, 81 mg at 04/02/23 0909   Chlorhexidine Gluconate Cloth 2 % PADS 6 each, 6 each, Topical, Daily, Micki Riley, MD   clopidogrel (PLAVIX) tablet 75 mg, 75 mg, Oral, Daily, Erick Blinks, MD, 75 mg at 04/02/23 0909   enoxaparin (LOVENOX) injection 40 mg, 40 mg, Subcutaneous, Q24H, Erick Blinks, MD, 40 mg at 04/02/23 0909   Oral care  mouth rinse, 15 mL, Mouth Rinse, PRN, Micki Riley, MD   rosuvastatin (CRESTOR) tablet 40 mg, 40 mg, Oral, Daily, Sethi, Jason Fila, MD   senna-docusate (Senokot-S) tablet 1 tablet, 1 tablet, Oral, QHS PRN, Erick Blinks, MD    No family history on file.  Social History   Socioeconomic History   Marital status: Single    Spouse name: Not on file   Number of children: Not on file   Years of education: Not on file   Highest education level: Not on file  Occupational History   Not on file  Tobacco Use   Smoking status: Never   Smokeless tobacco: Never  Substance and Sexual Activity   Alcohol use: Yes   Drug use: Never   Sexual activity: Yes  Other Topics Concern   Not on file  Social History Narrative   Not on file   Social Drivers of Health   Financial Resource Strain: Not on file  Food Insecurity: No Food Insecurity (04/02/2023)   Hunger Vital Sign    Worried About Running Out of Food in the Last Year: Never true    Ran Out of Food in the Last Year: Never true  Transportation Needs: No Transportation Needs (04/02/2023)   PRAPARE - Administrator, Civil Service (Medical): No    Lack of Transportation (Non-Medical): No  Physical Activity: Not on file  Stress: Not on file  Social Connections: Not on file    Review of Systems: A 12 point ROS discussed and pertinent  positives are indicated in the HPI above.  All other systems are negative.  Review of Systems  Vital Signs: BP (!) 168/100   Pulse 70   Temp 97.6 F (36.4 C) (Oral)   Resp 19   Wt 222 lb 7.1 oz (100.9 kg)   SpO2 98%   BMI 33.82 kg/m   Physical Exam Constitutional:      Appearance: He is not ill-appearing.  HENT:     Mouth/Throat:     Mouth: Mucous membranes are moist.     Pharynx: Oropharynx is clear.  Cardiovascular:     Rate and Rhythm: Normal rate and regular rhythm.     Pulses: Normal pulses.     Heart sounds: Normal heart sounds.  Pulmonary:     Effort: Pulmonary  effort is normal. No respiratory distress.     Breath sounds: Normal breath sounds.  Neurological:     General: No focal deficit present.     Mental Status: He is alert and oriented to person, place, and time.  Psychiatric:        Mood and Affect: Mood normal.        Thought Content: Thought content normal.      Imaging: CT ANGIO HEAD NECK W WO CM Result Date: 04/01/2023 CLINICAL DATA:  Left-sided ear pain, speech changes, off balance; acute and subacute infarcts on MRI EXAM: CT ANGIOGRAPHY HEAD AND NECK WITH AND WITHOUT CONTRAST TECHNIQUE: Multidetector CT imaging of the head and neck was performed using the standard protocol during bolus administration of intravenous contrast. Multiplanar CT image reconstructions and MIPs were obtained to evaluate the vascular anatomy. Carotid stenosis measurements (when applicable) are obtained utilizing NASCET criteria, using the distal internal carotid diameter as the denominator. RADIATION DOSE REDUCTION: This exam was performed according to the departmental dose-optimization program which includes automated exposure control, adjustment of the mA and/or kV according to patient size and/or use of iterative reconstruction technique. CONTRAST:  75mL OMNIPAQUE IOHEXOL 350 MG/ML SOLN COMPARISON:  No prior CTA available, correlation is made with 04/01/2023 CT head and 04/01/2023 MRI head FINDINGS: CT HEAD FINDINGS For noncontrast findings, please see same day CT head. CTA NECK FINDINGS Aortic arch: Standard branching. Imaged portion shows no evidence of aneurysm or dissection. No significant stenosis of the major arch vessel origins. Right carotid system: No evidence of dissection, occlusion, or hemodynamically significant stenosis (greater than 50%). Left carotid system: No evidence of dissection, occlusion, or hemodynamically significant stenosis (greater than 50%). Vertebral arteries: Nonopacification of the left vertebral artery from its origin through the skull  base, which may be chronic. The right vertebral artery is patent from its origin to the skull base without significant stenosis. Skeleton: No acute osseous abnormality. Degenerative changes in the cervical spine. Other neck: No acute finding. Upper chest: No focal pulmonary opacity or pleural effusion. Review of the MIP images confirms the above findings CTA HEAD FINDINGS Anterior circulation: Both internal carotid arteries are patent to the termini, with moderate stenosis in the proximal right supraclinoid ICA (series 6, images 10 9-110) and mild stenosis in the proximal left supraclinoid ICA (series 6, image 109). A1 segments patent. Normal anterior communicating artery. Anterior cerebral arteries are patent to their distal aspects without significant stenosis. No M1 stenosis or occlusion. MCA branches perfused to their distal aspects without significant stenosis. Posterior circulation: The left vertebral artery is not opacified in the V4 segment. The right vertebral artery is fairly well opacified in the proximal right V4 segment, with diminishing opacification  in the more distal right V4 and at the vertebrobasilar junction. Poor opacification of basilar artery, which is not well seen. Better opacification in the left greater than right P1 segment, with poor opacification of the P2 segments and more distal PCAs. The posterior communicating arteries are not definitively seen but are likely patent given minimal opacification of the posterior circulation. Venous sinuses: Not well opacified due to phase of timing. Anatomic variants: None significant. No evidence of aneurysm or vascular malformation. Review of the MIP images confirms the above findings IMPRESSION: 1. Nonopacification of the left vertebral artery from its origin through the vertebrobasilar junction, which may be chronic. 2. Poor opacification of the remainder of the posterior circulation, with fairly robust opacification the proximal right V4 and P1  segment, but poor opacification of the distal right V4, basilar, and more distal PCAs. 3. Moderate stenosis in the proximal right supraclinoid ICA and mild stenosis in the proximal left supraclinoid ICA. 4. No hemodynamically significant stenosis in the neck. These results will be called to the ordering clinician or representative by the Radiologist Assistant, and communication documented in the PACS or [employer]. Electronically Signed   By: Wiliam Ke M.D.   On: 04/01/2023 18:51   MR BRAIN WO CONTRAST Addendum Date: 04/01/2023 ADDENDUM REPORT: 04/01/2023 17:00 ADDENDUM: Critical Value/emergent results were called by telephone at the time of interpretation on 04/01/2023 at 4:58 pm to provider Dr. Larinda Buttery, who verbally acknowledged these results. Results were also discussed with the stroke neurologist on-call, Dr. Iver Nestle, at 4:39 p.m. Electronically Signed   By: Orvan Falconer M.D.   On: 04/01/2023 17:00   Result Date: 04/01/2023 CLINICAL DATA:  Neuro deficit, acute, stroke suspected. Balance difficulty. EXAM: MRI HEAD WITHOUT CONTRAST TECHNIQUE: Multiplanar, multiecho pulse sequences of the brain and surrounding structures were obtained without intravenous contrast. COMPARISON:  Head CT 04/01/2023. FINDINGS: Brain: Scattered foci of acute and subacute infarction throughout the brainstem and left cerebellar hemisphere. Acute hemorrhage or significant mass effect. Background of mild chronic small-vessel disease. The previously questioned left occipital lobe infarct is chronic. No hydrocephalus or extra-axial collection. No mass or abnormal susceptibility Vascular: Loss of the distal vertebral and basilar artery flow voids, suspicious for occlusion. Skull and upper cervical spine: Normal marrow signal. Sinuses/Orbits: No acute findings. Other: None. IMPRESSION: 1. Scattered foci of acute and subacute infarction throughout the brainstem and left cerebellar hemisphere. 2. Loss of the distal vertebral and  basilar artery flow voids, suspicious for occlusion. CTA of the head and neck is recommended for confirmation. Radiology assistant personnel have been notified to put me in telephone contact with the referring physician or the referring physician's clinical representative in order to discuss these findings. Once this communication is established I will issue an addendum to this report for documentation purposes. Electronically Signed: By: Orvan Falconer M.D. On: 04/01/2023 16:23   CT HEAD WO CONTRAST Result Date: 04/01/2023 CLINICAL DATA:  Left-sided ear pain EXAM: CT HEAD WITHOUT CONTRAST TECHNIQUE: Contiguous axial images were obtained from the base of the skull through the vertex without intravenous contrast. RADIATION DOSE REDUCTION: This exam was performed according to the departmental dose-optimization program which includes automated exposure control, adjustment of the mA and/or kV according to patient size and/or use of iterative reconstruction technique. COMPARISON:  Head CT 10/31/22 FINDINGS: Brain: Compared to prior exam there is a new age indeterminate infarct in the left occipital lobe (series 3, image 17). No hemorrhage. No hydrocephalus. No extra-axial fluid collection. No mass effect. No mass  lesion. Vascular: No hyperdense vessel or unexpected calcification. Skull: Mild soft tissue swelling along the midline posterior scalp. Calvarial fracture. No focal bone lesion. Sinuses/Orbits: No middle ear or mastoid effusion. Paranasal sinuses are notable for mucosal thickening in the right sphenoid and bilateral maxillary sinuses. Orbits are unremarkable. Other: None. IMPRESSION: New age indeterminate infarct in the left occipital lobe. Recommend brain MRI for further evaluation. Electronically Signed   By: Lorenza Cambridge M.D.   On: 04/01/2023 10:02    Labs:  CBC: Recent Labs    10/31/22 0805 11/04/22 1141 03/06/23 1449 04/01/23 0828 04/02/23 0552  WBC 8.1 10.3  --  7.7 8.0  HGB 14.9 15.6   --  18.0* 16.8  HCT 46.8 50.9 54.3* 55.3* 50.1  PLT 301 326  --  233 229    COAGS: Recent Labs    04/01/23 0828  INR 1.0  APTT 31    BMP: Recent Labs    10/31/22 0805 11/04/22 1141 04/01/23 0828 04/02/23 0552  NA 134* 137 137  --   K 4.1 4.2 3.7  --   CL 101 101 101  --   CO2 25 29 26   --   GLUCOSE 115* 109* 119*  --   BUN 19 16 19   --   CALCIUM 8.4* 8.8* 9.0  --   CREATININE 1.23 1.27* 1.33* 1.32*  GFRNONAA >60 >60 >60 >60    LIVER FUNCTION TESTS: Recent Labs    04/01/23 0828  BILITOT 0.8  AST 17  ALT 16  ALKPHOS 70  PROT 7.4  ALBUMIN 3.7     Assessment and Plan: Left vertebral stenosis/occlusion Imaging and case reviewed. Risks and benefits of cerebral angiogram were discussed with the patient including, but not limited to bleeding, infection, vascular injury or contrast induced renal failure.  This interventional procedure involves the use of X-rays and because of the nature of the planned procedure, it is possible that we will have prolonged use of X-ray fluoroscopy.  Potential radiation risks to you include (but are not limited to) the following: - A slightly elevated risk for cancer several years later in life. This risk is typically less than 0.5% percent. This risk is low in comparison to the normal incidence of human cancer, which is 33% for women and 50% for men according to the [employer]. - Radiation induced injury can include skin redness, resembling a rash, tissue breakdown / ulcers and hair loss (which can be temporary or permanent).   The likelihood of either of these occurring depends on the difficulty of the procedure and whether you are sensitive to radiation due to previous procedures, disease, or genetic conditions.   IF your procedure requires a prolonged use of radiation, you will be notified and given written instructions for further action.  It is your responsibility to monitor the irradiated area for the 2 weeks  following the procedure and to notify your physician if you are concerned that you have suffered a radiation induced injury.    All of the patient's questions were answered, patient is agreeable to proceed.  Consent signed and in chart.    Electronically Signed: Brayton El, PA-C 04/02/2023, 10:22 AM   I spent a total of 30 minutes in face to face in clinical consultation, greater than 50% of which was counseling/coordinating care for cerebral angiogram

## 2023-04-02 NOTE — Progress Notes (Addendum)
STROKE TEAM PROGRESS NOTE   BRIEF HPI [name] is a 62 y.o. male with history of DM, HTN presenting with 2 weeks of left ear pain, tinnitus, and pain behind the left ear. Loaded with ASA 325mg  and Plavix 300mg  in ED.   NIH on Admission 1  SIGNIFICANT HOSPITAL EVENTS 12/17- admit to ICU 12/18- IR for cerebral angiogram  INTERIM HISTORY/SUBJECTIVE Plan for neuro IR today. Neurologically stable, hemodynamically stable.  States he initially thought symptoms were from an inner ear infection, however he then developed difficulty with his balance. Endorses taking ASA 81mg  daily  MRI brain shows scattered acute to subacute infarcts throughout the brainstem and left cerebellar hemisphere. CT angiogram shows left vertebral artery occlusion at its origin and poor of pacification of the remainder of the posterior circulation with likely distal right vertebral artery and proximal basilar high-grade stenosis with poor distal flow.  Moderate proximal right supraclinoid ICA stenosis  OBJECTIVE  CBC    Component Value Date/Time   WBC 8.0 04/02/2023 0552   RBC 5.96 (H) 04/02/2023 0552   HGB 16.8 04/02/2023 0552   HGB 15.9 07/27/2011 1426   HCT 50.1 04/02/2023 0552   HCT 54.3 (H) 03/06/2023 1449   PLT 229 04/02/2023 0552   PLT 240 07/27/2011 1426   MCV 84.1 04/02/2023 0552   MCV 88 07/27/2011 1426   MCH 28.2 04/02/2023 0552   MCHC 33.5 04/02/2023 0552   RDW 16.5 (H) 04/02/2023 0552   RDW 13.5 07/27/2011 1426   LYMPHSABS 2.5 04/01/2023 0828   MONOABS 0.6 04/01/2023 0828   EOSABS 0.4 04/01/2023 0828   BASOSABS 0.0 04/01/2023 0828    BMET    Component Value Date/Time   NA 137 04/01/2023 0828   NA 139 07/27/2011 1426   K 3.7 04/01/2023 0828   K 3.6 07/27/2011 1426   CL 101 04/01/2023 0828   CL 104 07/27/2011 1426   CO2 26 04/01/2023 0828   CO2 27 07/27/2011 1426   GLUCOSE 119 (H) 04/01/2023 0828   GLUCOSE 123 (H) 07/27/2011 1426   BUN 19 04/01/2023 0828   BUN 17 07/27/2011  1426   CREATININE 1.32 (H) 04/02/2023 0552   CREATININE 1.27 07/27/2011 1426   CALCIUM 9.0 04/01/2023 0828   CALCIUM 8.3 (L) 07/27/2011 1426   GFRNONAA >60 04/02/2023 0552   GFRNONAA >60 07/27/2011 1426    IMAGING past 24 hours CT ANGIO HEAD NECK W WO CM Result Date: 04/01/2023 CLINICAL DATA:  Left-sided ear pain, speech changes, off balance; acute and subacute infarcts on MRI EXAM: CT ANGIOGRAPHY HEAD AND NECK WITH AND WITHOUT CONTRAST TECHNIQUE: Multidetector CT imaging of the head and neck was performed using the standard protocol during bolus administration of intravenous contrast. Multiplanar CT image reconstructions and MIPs were obtained to evaluate the vascular anatomy. Carotid stenosis measurements (when applicable) are obtained utilizing NASCET criteria, using the distal internal carotid diameter as the denominator. RADIATION DOSE REDUCTION: This exam was performed according to the departmental dose-optimization program which includes automated exposure control, adjustment of the mA and/or kV according to patient size and/or use of iterative reconstruction technique. CONTRAST:  75mL OMNIPAQUE IOHEXOL 350 MG/ML SOLN COMPARISON:  No prior CTA available, correlation is made with 04/01/2023 CT head and 04/01/2023 MRI head FINDINGS: CT HEAD FINDINGS For noncontrast findings, please see same day CT head. CTA NECK FINDINGS Aortic arch: Standard branching. Imaged portion shows no evidence of aneurysm or dissection. No significant stenosis of the major arch vessel origins. Right carotid  system: No evidence of dissection, occlusion, or hemodynamically significant stenosis (greater than 50%). Left carotid system: No evidence of dissection, occlusion, or hemodynamically significant stenosis (greater than 50%). Vertebral arteries: Nonopacification of the left vertebral artery from its origin through the skull base, which may be chronic. The right vertebral artery is patent from its origin to the skull base  without significant stenosis. Skeleton: No acute osseous abnormality. Degenerative changes in the cervical spine. Other neck: No acute finding. Upper chest: No focal pulmonary opacity or pleural effusion. Review of the MIP images confirms the above findings CTA HEAD FINDINGS Anterior circulation: Both internal carotid arteries are patent to the termini, with moderate stenosis in the proximal right supraclinoid ICA (series 6, images 10 9-110) and mild stenosis in the proximal left supraclinoid ICA (series 6, image 109). A1 segments patent. Normal anterior communicating artery. Anterior cerebral arteries are patent to their distal aspects without significant stenosis. No M1 stenosis or occlusion. MCA branches perfused to their distal aspects without significant stenosis. Posterior circulation: The left vertebral artery is not opacified in the V4 segment. The right vertebral artery is fairly well opacified in the proximal right V4 segment, with diminishing opacification in the more distal right V4 and at the vertebrobasilar junction. Poor opacification of basilar artery, which is not well seen. Better opacification in the left greater than right P1 segment, with poor opacification of the P2 segments and more distal PCAs. The posterior communicating arteries are not definitively seen but are likely patent given minimal opacification of the posterior circulation. Venous sinuses: Not well opacified due to phase of timing. Anatomic variants: None significant. No evidence of aneurysm or vascular malformation. Review of the MIP images confirms the above findings IMPRESSION: 1. Nonopacification of the left vertebral artery from its origin through the vertebrobasilar junction, which may be chronic. 2. Poor opacification of the remainder of the posterior circulation, with fairly robust opacification the proximal right V4 and P1 segment, but poor opacification of the distal right V4, basilar, and more distal PCAs. 3. Moderate  stenosis in the proximal right supraclinoid ICA and mild stenosis in the proximal left supraclinoid ICA. 4. No hemodynamically significant stenosis in the neck. These results will be called to the ordering clinician or representative by the Radiologist Assistant, and communication documented in the PACS or [employer]. Electronically Signed   By: Wiliam Ke M.D.   On: 04/01/2023 18:51   MR BRAIN WO CONTRAST Addendum Date: 04/01/2023 ADDENDUM REPORT: 04/01/2023 17:00 ADDENDUM: Critical Value/emergent results were called by telephone at the time of interpretation on 04/01/2023 at 4:58 pm to provider Dr. Larinda Buttery, who verbally acknowledged these results. Results were also discussed with the stroke neurologist on-call, Dr. Iver Nestle, at 4:39 p.m. Electronically Signed   By: Orvan Falconer M.D.   On: 04/01/2023 17:00   Result Date: 04/01/2023 CLINICAL DATA:  Neuro deficit, acute, stroke suspected. Balance difficulty. EXAM: MRI HEAD WITHOUT CONTRAST TECHNIQUE: Multiplanar, multiecho pulse sequences of the brain and surrounding structures were obtained without intravenous contrast. COMPARISON:  Head CT 04/01/2023. FINDINGS: Brain: Scattered foci of acute and subacute infarction throughout the brainstem and left cerebellar hemisphere. Acute hemorrhage or significant mass effect. Background of mild chronic small-vessel disease. The previously questioned left occipital lobe infarct is chronic. No hydrocephalus or extra-axial collection. No mass or abnormal susceptibility Vascular: Loss of the distal vertebral and basilar artery flow voids, suspicious for occlusion. Skull and upper cervical spine: Normal marrow signal. Sinuses/Orbits: No acute findings. Other: None. IMPRESSION: 1. Scattered foci  of acute and subacute infarction throughout the brainstem and left cerebellar hemisphere. 2. Loss of the distal vertebral and basilar artery flow voids, suspicious for occlusion. CTA of the head and neck is recommended for  confirmation. Radiology assistant personnel have been notified to put me in telephone contact with the referring physician or the referring physician's clinical representative in order to discuss these findings. Once this communication is established I will issue an addendum to this report for documentation purposes. Electronically Signed: By: Orvan Falconer M.D. On: 04/01/2023 16:23   CT HEAD WO CONTRAST Result Date: 04/01/2023 CLINICAL DATA:  Left-sided ear pain EXAM: CT HEAD WITHOUT CONTRAST TECHNIQUE: Contiguous axial images were obtained from the base of the skull through the vertex without intravenous contrast. RADIATION DOSE REDUCTION: This exam was performed according to the departmental dose-optimization program which includes automated exposure control, adjustment of the mA and/or kV according to patient size and/or use of iterative reconstruction technique. COMPARISON:  Head CT 10/31/22 FINDINGS: Brain: Compared to prior exam there is a new age indeterminate infarct in the left occipital lobe (series 3, image 17). No hemorrhage. No hydrocephalus. No extra-axial fluid collection. No mass effect. No mass lesion. Vascular: No hyperdense vessel or unexpected calcification. Skull: Mild soft tissue swelling along the midline posterior scalp. Calvarial fracture. No focal bone lesion. Sinuses/Orbits: No middle ear or mastoid effusion. Paranasal sinuses are notable for mucosal thickening in the right sphenoid and bilateral maxillary sinuses. Orbits are unremarkable. Other: None. IMPRESSION: New age indeterminate infarct in the left occipital lobe. Recommend brain MRI for further evaluation. Electronically Signed   By: Lorenza Cambridge M.D.   On: 04/01/2023 10:02    Vitals:   04/02/23 0600 04/02/23 0700 04/02/23 0730 04/02/23 0800  BP: (!) 155/108 (!) 154/101  (!) 171/106  Pulse: 69 68 69 71  Resp: 20 19 (!) 21 (!) 21  Temp:    97.6 F (36.4 C)  TempSrc:    Oral  SpO2: 96% 98% 98% 97%  Weight:          PHYSICAL EXAM General:  Alert, mildly obese middle-age Caucasian male in no acute distress Psych:  Mood and affect appropriate for situation CV: Regular rate and rhythm on monitor Respiratory:  Regular, unlabored respirations on room air GI: Abdomen soft and nontender   NEURO:  Mental Status: AA&Ox3, patient is able to give clear and coherent history Speech/Language: speech is without dysarthria or aphasia.  Naming, repetition, fluency, and comprehension intact.  Cranial Nerves:  II: PERRL. Visual fields full.  III, IV, VI: EOMI. Eyelids elevate symmetrically.  Saccadic dysmetria on lateral gaze bilaterally.  No nystagmus V: Sensation is intact to light touch and symmetrical to face.  VII: Face is symmetrical resting and smiling VIII: hearing intact to voice. IX, X: Palate elevates symmetrically. Phonation is normal.  GN:FAOZHYQM shrug 5/5. XII: tongue is midline without fasciculations. Motor: 5/5 strength to all muscle groups tested.  Tone: is normal and bulk is normal Sensation- Intact to light touch bilaterally. Extinction absent to light touch to DSS.   Coordination: FTN intact bilaterally, HKS: no ataxia in BLE.No drift.  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 Poor opacification of the remainder of the posterior circulation, with fairly robust opacification the proximal right V4 and P1 segment, but poor opacification of the distal right V4, basilar, and more distal PCAs. Moderate stenosis in the proximal right supraclinoid ICA and mild stenosis in the proximal left supraclinoid ICA. MRI  Scattered  foci of acute and subacute infarction throughout the brainstem and left cerebellar hemisphere. Loss of the distal vertebral and basilar artery flow voids, suspicious for occlusion.  2D Echo Pending  LDL 101 HgbA1c 5.2 VTE prophylaxis - Lovenox aspirin 81 mg daily prior to admission, now on aspirin 81 mg daily and clopidogrel 75 mg daily for 3 months and then Plavix alone. Therapy recommendations:  Outpatient PT/OT/ST Disposition:  Pending   Left vertebral artery stenosis Cerebral angiogram 12/18 with Dr. Corliss Skains   Hypertension Home meds:  None Stable Blood Pressure Goal: BP less than 220/110   Hyperlipidemia Home meds:  None, resumed in hospital LDL 101, goal < 70 Add Crestor 40mg   Continue statin at discharge  Diabetes type II Controlled Home meds:  None HgbA1c 5.2, goal < 7.0 CBGs SSI Recommend close follow-up with PCP for better DM control  Dysphagia Patient has post-stroke dysphagia, SLP consulted    Diet   Diet NPO time specified   Advance diet as tolerated  Other Stroke Risk Factors ETOH use, alcohol level <10, advised to drink no more than 2 drink(s) a day Obesity, Body mass index is 33.82 kg/m., BMI >/= 30 associated with increased stroke risk, recommend weight loss, diet and exercise as appropriate   Hospital day # 1  Patient seen and examined by NP/APP with MD. MD to update note as needed.   Elmer Picker, DNP, FNP-BC [facility] Pager: [phone]  STROKE MD NOTE : I have personally obtained history,examined this patient, reviewed notes, independently viewed imaging studies, participated in medical decision making and plan of care.ROS completed by me personally and pertinent positives fully documented  I have made any additions or clarifications directly to the above note. Agree with note above.  Patient presented with subacute symptoms of dizziness and nausea and ataxia due to symptomatic left vertebral artery occlusion as well as  high-grade distal right vertebral and proximal basilar stenosis which is symptomatic with MRI showing multiple brainstem infarcts left cerebellar infarcts.  He remains at risk for neurological worsening and recurrent strokes and basilar artery occlusion.  Diagnostic cerebral catheter angiogram done today.  Personally reviewed shows left vertebral artery occlusion at the origin with no antegrade flow.  High-grade stenosis of distal right vertebral basilar proximal basilar artery with near occlusion and only trace flow distally.  Some discussion with patient and Dr. Corliss Skains about treatment options and risks and benefits of vascular revascularization versus aggressive medical therapy.  Patient is willing to undergo angioplasty stenting which we will arrange during this hospitalization in the next few days after anesthesia availability.  Continue dual antiplatelet therapy with aspirin and Plavix today we will switch to aspirin and Brilinta tomorrow and continue IV hydration.  If patient has acute neurological worsening may consider emergent basilar plasty and stenting.  Continue close neurological monitoring blood pressure control.This patient is critically ill and at significant risk of neurological worsening, death and care requires constant monitoring of vital signs, hemodynamics,respiratory and cardiac monitoring, extensive review of multiple databases, frequent neurological assessment, discussion with family, other specialists and medical decision making of high complexity.I have made any additions or clarifications directly to  the above note.This critical care time does not reflect procedure time, or teaching time or supervisory time of PA/NP/Med Resident etc but could involve care discussion time.  I spent 30 minutes of neurocritical care time  in the care of  this patient.      Delia Heady, MD Medical Director [facility] Pager: [phone] 04/02/2023 3:59 PM   To contact Stroke  Continuity provider, please refer to [email or web address]. After hours, contact General Neurology

## 2023-04-03 ENCOUNTER — Other Ambulatory Visit (HOSPITAL_COMMUNITY): Payer: Self-pay

## 2023-04-03 DIAGNOSIS — I651 Occlusion and stenosis of basilar artery: Secondary | ICD-10-CM

## 2023-04-03 DIAGNOSIS — I639 Cerebral infarction, unspecified: Secondary | ICD-10-CM

## 2023-04-03 MED ORDER — TICAGRELOR 90 MG PO TABS
180.0000 mg | ORAL_TABLET | Freq: Once | ORAL | Status: AC
Start: 2023-04-03 — End: 2023-04-03
  Administered 2023-04-03: 180 mg via ORAL
  Filled 2023-04-03: qty 2

## 2023-04-03 MED ORDER — TICAGRELOR 90 MG PO TABS: 90.0000 mg | ORAL_TABLET | Freq: Two times a day (BID) | ORAL | Status: DC

## 2023-04-03 MED ORDER — TICAGRELOR 90 MG PO TABS
90.0000 mg | ORAL_TABLET | Freq: Two times a day (BID) | ORAL | Status: DC
  Administered 2023-04-03 – 2023-04-04 (×2): 90 mg via ORAL
  Filled 2023-04-03 (×2): qty 1

## 2023-04-03 NOTE — Evaluation (Signed)
Physical Therapy Evaluation Patient Details Name: Gavin Dorsey MRN: [id number] DOB: 1960/10/20 Today's Date: 04/03/2023  History of Present Illness  Gavin Dorsey is a 62 yo male who presented with 2 week hx of L ear pain, dysequilibrium, and feeling off balance. MRI Brain and CTA with scattered brainstem and left cerebellar infarcts.  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 Cerebral angiogram complete.  Carotic stenting planned 12/20.  PMHx: DM II, HTN  Clinical Impression  Pt admitted with/for cysequilibriun, feeling off balance and L ear pain.  Patient also found to have suffered a scattered brainstem and L cerebellar infarcts.  Pt needing min assist overall at thisr  Pt currently limited functionally due to the problems listed. ( See problems list.)   Pt will benefit from PT to maximize function and safety in order to get ready for next venue listed below.         If plan is discharge home, recommend the following:  (tbd AFTER SX TOMORROW.)   Can travel by private vehicle        Equipment Recommendations    Recommendations for Other Services  Rehab consult    Functional Status Assessment Patient has had a recent decline in their functional status and demonstrates the ability to make significant improvements in function in a reasonable and predictable amount of time.     Precautions / Restrictions Precautions Precautions: Fall Restrictions Weight Bearing Restrictions Per Provider Order: No      Mobility  Bed Mobility Overal bed mobility: Needs Assistance Bed Mobility: Supine to Sit     Supine to sit: Contact guard          Transfers Overall transfer level: Needs assistance Equipment used: Rolling walker (2 wheels) Transfers: Sit to/from Stand Sit to Stand: Contact guard assist                Ambulation/Gait Ambulation/Gait assistance: Min assist Gait Distance (Feet): 300 Feet Assistive device: None Gait Pattern/deviations: Decreased  stride length, Step-through pattern   Gait velocity interpretation: <1.8 ft/sec, indicate of risk for recurrent falls   General Gait Details: pt with drift L/R and stagger left to recover balance worsening with scanning L>R.  Overall unsteady, will add AD as necessary.,  Stairs            Wheelchair Mobility     Tilt Bed    Modified Rankin (Stroke Patients Only)       Balance Overall balance assessment: Needs assistance Sitting-balance support: Feet supported Sitting balance-Leahy Scale: Good     Standing balance support: No upper extremity supported, During functional activity Standing balance-Leahy Scale: Fair Standing balance comment: statically                 Standardized Balance Assessment Standardized Balance Assessment : Berg Balance Test Berg Balance Test Sit to Stand: Able to stand without using hands and stabilize independently Standing Unsupported: Able to stand 2 minutes with supervision Sitting with Back Unsupported but Feet Supported on Floor or Stool: Able to sit safely and securely 2 minutes Stand to Sit: Controls descent by using hands Transfers: Able to transfer safely, definite need of hands Standing Unsupported with Eyes Closed: Able to stand 10 seconds with supervision Standing Ubsupported with Feet Together: Able to place feet together independently and stand for 1 minute with supervision From Standing Position, Pick up Object from Floor: Able to pick up shoe, needs supervision Turn 360 Degrees: Able to turn 360 degrees safely in 4 seconds or less  Standing Unsupported, One Foot in Front: Able to take small step independently and hold 30 seconds         Pertinent Vitals/Pain Pain Assessment Pain Assessment: Faces Faces Pain Scale: Hurts a little bit Pain Location: L ear Pain Descriptors / Indicators: Discomfort, Grimacing Pain Intervention(s): Monitored during session    Home Living Family/patient expects to be discharged to::  Private residence Living Arrangements: Alone Available Help at Discharge: Family;Friend(s);Neighbor;Available PRN/intermittently Type of Home: Apartment Home Access: Level entry     Alternate Level Stairs-Number of Steps: flight Home Layout: Two level;Bed/bath upstairs Home Equipment: None      Prior Function Prior Level of Function : Independent/Modified Independent;Driving             Mobility Comments: no AD, one fall due to recent symptoms ADLs Comments: indep, does not work     Extremity/Trunk Assessment        Lower Extremity Assessment Lower Extremity Assessment:  (bil incoordination   R>L,  no focal weakness)    Cervical / Trunk Assessment Cervical / Trunk Assessment: Normal  Communication   Communication Communication: No apparent difficulties  Cognition Arousal: Alert Behavior During Therapy: WFL for tasks assessed/performed Overall Cognitive Status: Within Functional Limits for tasks assessed                                          General Comments General comments (skin integrity, edema, etc.): vss    Exercises     Assessment/Plan    PT Assessment    PT Problem List Decreased activity tolerance;Decreased coordination       PT Treatment Interventions      PT Goals (Current goals can be found in the Care Plan section)  Acute Rehab PT Goals Patient Stated Goal: get home PT Goal Formulation: Patient unable to participate in goal setting Time For Goal Achievement: 04/17/23 Potential to Achieve Goals: Good    Frequency Min 1X/week     Co-evaluation               AM-PAC PT "6 Clicks" Mobility  Outcome Measure Help needed turning from your back to your side while in a flat bed without using bedrails?: A Little Help needed moving from lying on your back to sitting on the side of a flat bed without using bedrails?: None Help needed moving to and from a bed to a chair (including a wheelchair)?: None Help needed  standing up from a chair using your arms (e.g., wheelchair or bedside chair)?: A Little Help needed to walk in hospital room?: A Little Help needed climbing 3-5 steps with a railing? : A Little 6 Click Score: 20    End of Session   Activity Tolerance: Patient tolerated treatment well Patient left: in bed Nurse Communication: Mobility status PT Visit Diagnosis: Unsteadiness on feet (R26.81)    Time: 9147-8295 PT Time Calculation (min) (ACUTE ONLY): 26 min   Charges:   PT Evaluation $PT Eval Moderate Complexity: 1 Mod PT Treatments $Gait Training: 8-22 mins PT General Charges $$ ACUTE PT VISIT: 1 Visit         04/03/2023  Jacinto Halim., PT Acute Rehabilitation Services [phone]  (office)  Eliseo Gum Avarae Zwart 04/03/2023, 5:25 PM

## 2023-04-03 NOTE — Progress Notes (Addendum)
STROKE TEAM PROGRESS NOTE   BRIEF HPI [name] is a 62 y.o. male with history of DM, HTN presenting with 2 weeks of left ear pain, tinnitus, and pain behind the left ear. Loaded with ASA 325mg  and Plavix 300mg  in ED.   NIH on Admission 1  SIGNIFICANT HOSPITAL EVENTS 12/17- admit to ICU 12/18- IR for cerebral angiogram 12/19- load with Brilinta  INTERIM HISTORY/SUBJECTIVE Neurologically stable, hemodynamically stable.  No complaints. MRI brain shows scattered acute to subacute infarcts throughout the brainstem and left cerebellar hemisphere. Plan for stenting in neuro IR on 12/20 Plan to change from aspirin Plavix to aspirin and Brilinta  OBJECTIVE  CBC    Component Value Date/Time   WBC 8.0 04/02/2023 0552   RBC 5.96 (H) 04/02/2023 0552   HGB 16.8 04/02/2023 0552   HGB 15.9 07/27/2011 1426   HCT 50.1 04/02/2023 0552   HCT 54.3 (H) 03/06/2023 1449   PLT 229 04/02/2023 0552   PLT 240 07/27/2011 1426   MCV 84.1 04/02/2023 0552   MCV 88 07/27/2011 1426   MCH 28.2 04/02/2023 0552   MCHC 33.5 04/02/2023 0552   RDW 16.5 (H) 04/02/2023 0552   RDW 13.5 07/27/2011 1426   LYMPHSABS 2.5 04/01/2023 0828   MONOABS 0.6 04/01/2023 0828   EOSABS 0.4 04/01/2023 0828   BASOSABS 0.0 04/01/2023 0828    BMET    Component Value Date/Time   NA 137 04/01/2023 0828   NA 139 07/27/2011 1426   K 3.7 04/01/2023 0828   K 3.6 07/27/2011 1426   CL 101 04/01/2023 0828   CL 104 07/27/2011 1426   CO2 26 04/01/2023 0828   CO2 27 07/27/2011 1426   GLUCOSE 119 (H) 04/01/2023 0828   GLUCOSE 123 (H) 07/27/2011 1426   BUN 19 04/01/2023 0828   BUN 17 07/27/2011 1426   CREATININE 1.32 (H) 04/02/2023 0552   CREATININE 1.27 07/27/2011 1426   CALCIUM 9.0 04/01/2023 0828   CALCIUM 8.3 (L) 07/27/2011 1426   GFRNONAA >60 04/02/2023 0552   GFRNONAA >60 07/27/2011 1426    IMAGING past 24 hours ECHOCARDIOGRAM COMPLETE Result Date: 04/02/2023    ECHOCARDIOGRAM REPORT   Patient Name:   COLTRANE TIRABASSI Date of Exam: 04/02/2023 Medical Rec #:  [id number]      Height:       68.0 in Accession #:    [id number]     Weight:       222.4 lb Date of Birth:  31-Mar-1961      BSA:          2.138 m Patient Age:    62 years       BP:           150/94 mmHg Patient Gender: M              HR:           69 bpm. Exam Location:  Inpatient Procedure: 2D Echo, Cardiac Doppler and Color Doppler Indications:    stroke  History:        Patient has no prior history of Echocardiogram examinations.                 Risk Factors:Diabetes and Hypertension.  Sonographer:    Delcie Roch RDCS Referring Phys: [id number] [facility] IMPRESSIONS  1. Left ventricular ejection fraction, by estimation, is 55 to 60%. The left ventricle has normal function. The left ventricle has no regional wall motion abnormalities. There is moderate concentric  left ventricular hypertrophy. Left ventricular diastolic parameters are consistent with Grade I diastolic dysfunction (impaired relaxation).  2. Right ventricular systolic function is normal. The right ventricular size is normal. Tricuspid regurgitation signal is inadequate for assessing PA pressure.  3. The mitral valve is normal in structure. No evidence of mitral valve regurgitation. No evidence of mitral stenosis.  4. The aortic valve is tricuspid. There is mild calcification of the aortic valve. Aortic valve regurgitation is not visualized. No aortic stenosis is present.  5. Aortic dilatation noted. There is mild dilatation of the ascending aorta, measuring 43 mm.  6. The inferior vena cava is normal in size with greater than 50% respiratory variability, suggesting right atrial pressure of 3 mmHg. FINDINGS  Left Ventricle: Left ventricular ejection fraction, by estimation, is 55 to 60%. The left ventricle has normal function. The left ventricle has no regional wall motion abnormalities. The left ventricular internal cavity size was normal in size. There is  moderate concentric left  ventricular hypertrophy. Left ventricular diastolic parameters are consistent with Grade I diastolic dysfunction (impaired relaxation). Right Ventricle: The right ventricular size is normal. No increase in right ventricular wall thickness. Right ventricular systolic function is normal. Tricuspid regurgitation signal is inadequate for assessing PA pressure. Left Atrium: Left atrial size was normal in size. Right Atrium: Right atrial size was normal in size. Pericardium: There is no evidence of pericardial effusion. Mitral Valve: The mitral valve is normal in structure. Mild mitral annular calcification. No evidence of mitral valve regurgitation. No evidence of mitral valve stenosis. Tricuspid Valve: The tricuspid valve is normal in structure. Tricuspid valve regurgitation is not demonstrated. Aortic Valve: The aortic valve is tricuspid. There is mild calcification of the aortic valve. Aortic valve regurgitation is not visualized. No aortic stenosis is present. Pulmonic Valve: The pulmonic valve was normal in structure. Pulmonic valve regurgitation is not visualized. Aorta: Aortic dilatation noted. There is mild dilatation of the ascending aorta, measuring 43 mm. Venous: The inferior vena cava is normal in size with greater than 50% respiratory variability, suggesting right atrial pressure of 3 mmHg. IAS/Shunts: No atrial level shunt detected by color flow Doppler.  LEFT VENTRICLE PLAX 2D LVIDd:         4.20 cm   Diastology LVIDs:         2.90 cm   LV e' medial:    4.90 cm/s LV PW:         1.20 cm   LV E/e' medial:  11.9 LV IVS:        1.40 cm   LV e' lateral:   10.40 cm/s LVOT diam:     2.10 cm   LV E/e' lateral: 5.6 LV SV:         59 LV SV Index:   28 LVOT Area:     3.46 cm  RIGHT VENTRICLE             IVC RV Basal diam:  2.80 cm     IVC diam: 1.70 cm RV S prime:     13.80 cm/s TAPSE (M-mode): 1.6 cm LEFT ATRIUM             Index        RIGHT ATRIUM           Index LA diam:        3.80 cm 1.78 cm/m   RA Area:      14.40 cm LA Vol (A2C):   44.3 ml 20.72 ml/m  RA  Volume:   33.80 ml  15.81 ml/m LA Vol (A4C):   51.4 ml 24.04 ml/m LA Biplane Vol: 50.8 ml 23.76 ml/m  AORTIC VALVE LVOT Vmax:   98.50 cm/s LVOT Vmean:  65.500 cm/s LVOT VTI:    0.170 m  AORTA Ao Root diam: 3.50 cm Ao Asc diam:  4.30 cm MITRAL VALVE MV Area (PHT): 3.42 cm    SHUNTS MV Decel Time: 222 msec    Systemic VTI:  0.17 m MV E velocity: 58.20 cm/s  Systemic Diam: 2.10 cm MV A velocity: 61.60 cm/s MV E/A ratio:  0.94 Dalton McleanMD Electronically signed by Wilfred Lacy Signature Date/Time: 04/02/2023/5:07:42 PM    Final     Vitals:   04/03/23 0500 04/03/23 0600 04/03/23 0700 04/03/23 0800  BP: (!) 146/85 (!) 152/97 (!) 159/96   Pulse: 70 69 71   Resp: (!) 21 20 (!) 23   Temp:    97.6 F (36.4 C)  TempSrc:    Oral  SpO2: 93% 95% 94%   Weight:         PHYSICAL EXAM General:  Alert, mildly obese middle-age Caucasian male in no acute distress Psych:  Mood and affect appropriate for situation CV: Regular rate and rhythm on monitor Respiratory:  Regular, unlabored respirations on room air GI: Abdomen soft and nontender   NEURO:  Mental Status: AA&Ox3, patient is able to give clear and coherent history Speech/Language: speech is without dysarthria or aphasia.  Naming, repetition, fluency, and comprehension intact.  Cranial Nerves:  II: PERRL. Visual fields full.  III, IV, VI: EOMI. Eyelids elevate symmetrically.  Saccadic dysmetria on lateral gaze bilaterally.  No nystagmus V: Sensation is intact to light touch and symmetrical to face.  VII: Face is symmetrical resting and smiling VIII: hearing intact to voice. IX, X: Palate elevates symmetrically. Phonation is normal.  ZO:[id number] shrug 5/5. XII: tongue is midline without fasciculations. Motor: 5/5 strength to all muscle groups tested.  Tone: is normal and bulk is normal Sensation- Intact to light touch bilaterally. Extinction absent to light touch to DSS.    Coordination: FTN intact bilaterally, HKS: no ataxia in BLE.No drift.  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 Poor opacification of the remainder of the posterior circulation, with fairly robust opacification the proximal right V4 and P1 segment, but poor opacification of the distal right V4, basilar, and more distal PCAs. Moderate stenosis in the proximal right supraclinoid ICA and mild stenosis in the proximal left supraclinoid ICA. MRI  Scattered foci of acute and subacute infarction throughout the brainstem and left cerebellar hemisphere. Loss of the distal vertebral and basilar artery flow voids, suspicious for occlusion.  2D Echo 55-60% LDL 101 HgbA1c 5.2 VTE prophylaxis - Lovenox aspirin 81 mg daily prior to admission, now on brilinta 90mg  BID and ASA 81mg  Stop plavix load with brilinta, plan for IR 12/20 Therapy recommendations:  Outpatient PT/OT/ST Disposition:  Pending   Left vertebral artery stenosis Cerebral angiogram 12/18 with Dr. Corliss Skains  Near occlusion of the  right vertebral basilar junction just distal to the right posterior inferior cerebellar  artery with a string sign  opacification of the proximal one third to half  of the basilar artery.  Distal one third of the basilar artery opacifies  retrogradely via a severely diseased left posterior communicating artery.   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 Plan for stenting 12/20- loaded with brilinta 12/19  Hypertension Home meds:  None Stable Blood Pressure Goal: BP less than 220/110    Hyperlipidemia Home meds:  None, resumed in hospital LDL 101, goal < 70 Add Crestor 40mg   Continue statin at discharge  Diabetes type II Controlled Home meds:  None HgbA1c 5.2, goal < 7.0 CBGs SSI Recommend close follow-up with PCP for better DM control  Dysphagia Patient has post-stroke dysphagia, SLP consulted    Diet   Diet Heart Room service appropriate? Yes with Assist; Fluid consistency: Thin   Advance diet as tolerated  Other Stroke Risk Factors ETOH use, alcohol level <10, advised to drink no more than 2 drink(s) a day Obesity, Body mass index is 33.82 kg/m., BMI >/= 30 associated with increased stroke risk, recommend weight loss, diet and exercise as appropriate   Hospital day # 2  Patient seen and examined by NP/APP with MD. MD to update note as needed.   Elmer Picker, DNP, FNP-BC [facility] Pager: [phone]  I have personally obtained history,examined this patient, reviewed notes, independently viewed imaging studies, participated in medical decision making and plan of care.ROS completed by me personally and pertinent positives fully documented  I have made any additions or clarifications directly to the above note. Agree with note above.  Patient remains neurologically stable.  Continue close neurological observation.  Plan to change from aspirin Plavix to aspirin and Brilinta.  Discussed with Dr. Corliss Skains.  Plan for angioplasty stenting tomorrow.  No family at the bedside.  Discussed with patient and answered questions.  Greater than 50% time during this 35-minute visit was spent in counseling and coordination of care and discussion about his vertebral and basilar artery stenosis and discussion about revascularization plans and risk-benefit angioplasty stenting and answered questions.  Delia Heady, MD Medical Director [facility] Pager: [phone] 04/03/2023 3:50 PM    To contact Stroke Continuity provider, please refer  to [email or web address]. After hours, contact General Neurology

## 2023-04-04 ENCOUNTER — Observation Stay (HOSPITAL_COMMUNITY): Payer: Medicaid Other | Admitting: Anesthesiology

## 2023-04-04 ENCOUNTER — Encounter (HOSPITAL_COMMUNITY)
Admission: EM | Disposition: A | Discharge status: Home / Self Care | Payer: Self-pay | Source: Other Acute Inpatient Hospital | Attending: Neurology

## 2023-04-04 ENCOUNTER — Inpatient Hospital Stay (HOSPITAL_COMMUNITY)
Admission: EM | Admit: 2023-04-04 | Discharge: 2023-04-04 | Disposition: A | Discharge status: Home / Self Care | Payer: Medicaid Other | Source: Other Acute Inpatient Hospital | Attending: Radiology | Admitting: Radiology

## 2023-04-04 DIAGNOSIS — I771 Stricture of artery: Secondary | ICD-10-CM

## 2023-04-04 DIAGNOSIS — I6322 Cerebral infarction due to unspecified occlusion or stenosis of basilar arteries: Secondary | ICD-10-CM | POA: Diagnosis present

## 2023-04-04 HISTORY — PX: RADIOLOGY WITH ANESTHESIA: SHX6223

## 2023-04-04 HISTORY — PX: IR CT HEAD LTD: IMG2386

## 2023-04-04 HISTORY — PX: IR INTRA CRAN STENT: IMG2345

## 2023-04-04 HISTORY — PX: IR US GUIDE VASC ACCESS RIGHT: IMG2390

## 2023-04-04 HISTORY — PX: IR ANGIO VERTEBRAL SEL VERTEBRAL UNI R MOD SED: IMG5368

## 2023-04-04 LAB — POCT ACTIVATED CLOTTING TIME
Activated Clotting Time: 164 s
Activated Clotting Time: 193 s

## 2023-04-04 LAB — ABO/RH: ABO/RH(D): O NEG

## 2023-04-04 LAB — TYPE AND SCREEN
ABO/RH(D): O NEG
Antibody Screen: NEGATIVE

## 2023-04-04 SURGERY — IR WITH ANESTHESIA
Anesthesia: General

## 2023-04-04 MED ORDER — IOHEXOL 300 MG/ML  SOLN
150.0000 mL | Freq: Once | INTRAMUSCULAR | Status: AC | PRN
  Administered 2023-04-04: 75 mL via INTRA_ARTERIAL

## 2023-04-04 MED ORDER — SODIUM CHLORIDE 0.9 % IV SOLN: INTRAVENOUS | Status: DC

## 2023-04-04 MED ORDER — PHENYLEPHRINE HCL-NACL 20-0.9 MG/250ML-% IV SOLN
INTRAVENOUS | Status: DC | PRN
  Administered 2023-04-04: 30 ug/min via INTRAVENOUS

## 2023-04-04 MED ORDER — HYDRALAZINE HCL 20 MG/ML IJ SOLN
INTRAMUSCULAR | Status: DC | PRN
  Administered 2023-04-04: 7.5 mg via INTRAVENOUS

## 2023-04-04 MED ORDER — FENTANYL CITRATE (PF) 250 MCG/5ML IJ SOLN
INTRAMUSCULAR | Status: DC | PRN
  Administered 2023-04-04 (×2): 50 ug via INTRAVENOUS

## 2023-04-04 MED ORDER — TICAGRELOR 90 MG PO TABS: 90.0000 mg | ORAL_TABLET | Freq: Two times a day (BID) | ORAL | Status: DC

## 2023-04-04 MED ORDER — HEPARIN SODIUM (PORCINE) 1000 UNIT/ML IJ SOLN
INTRAMUSCULAR | Status: AC
  Filled 2023-04-04: qty 10

## 2023-04-04 MED ORDER — ORAL CARE MOUTH RINSE: 15.0000 mL | Freq: Once | OROMUCOSAL | Status: AC

## 2023-04-04 MED ORDER — CLEVIDIPINE BUTYRATE 0.5 MG/ML IV EMUL
0.0000 mg/h | INTRAVENOUS | Status: AC
  Administered 2023-04-04: 2 mg/h via INTRAVENOUS
  Administered 2023-04-04: 21 mg/h via INTRAVENOUS
  Administered 2023-04-05: 32 mg/h via INTRAVENOUS
  Administered 2023-04-05: 28 mg/h via INTRAVENOUS
  Administered 2023-04-05: 10 mg/h via INTRAVENOUS
  Administered 2023-04-05: 28 mg/h via INTRAVENOUS
  Administered 2023-04-05: 32 mg/h via INTRAVENOUS
  Administered 2023-04-05: 24 mg/h via INTRAVENOUS
  Administered 2023-04-05: 32 mg/h via INTRAVENOUS
  Filled 2023-04-04 (×4): qty 100
  Filled 2023-04-04: qty 50
  Filled 2023-04-04 (×3): qty 100
  Filled 2023-04-04 (×2): qty 200

## 2023-04-04 MED ORDER — ASPIRIN 81 MG PO CHEW: 81.0000 mg | CHEWABLE_TABLET | Freq: Every day | ORAL | Status: DC

## 2023-04-04 MED ORDER — NITROGLYCERIN 1 MG/10 ML FOR IR/CATH LAB
INTRA_ARTERIAL | Status: AC
  Filled 2023-04-04: qty 10

## 2023-04-04 MED ORDER — CHLORHEXIDINE GLUCONATE 0.12 % MT SOLN
15.0000 mL | Freq: Once | OROMUCOSAL | Status: AC
  Administered 2023-04-04: 15 mL via OROMUCOSAL
  Filled 2023-04-04: qty 15

## 2023-04-04 MED ORDER — LIDOCAINE HCL 1 % IJ SOLN
INTRAMUSCULAR | Status: AC
  Filled 2023-04-04: qty 20

## 2023-04-04 MED ORDER — ACETAMINOPHEN 650 MG RE SUPP: 650.0000 mg | RECTAL | Status: DC | PRN

## 2023-04-04 MED ORDER — NITROGLYCERIN 1 MG/10 ML FOR IR/CATH LAB
INTRA_ARTERIAL | Status: AC
Start: 2023-04-04 — End: ?
  Filled 2023-04-04: qty 10

## 2023-04-04 MED ORDER — CLEVIDIPINE BUTYRATE 0.5 MG/ML IV EMUL: 0.0000 mg/h | INTRAVENOUS | Status: DC

## 2023-04-04 MED ORDER — VERAPAMIL HCL 2.5 MG/ML IV SOLN
INTRA_ARTERIAL | Status: AC | PRN
  Administered 2023-04-04: 6 mL via INTRA_ARTERIAL

## 2023-04-04 MED ORDER — SUGAMMADEX SODIUM 200 MG/2ML IV SOLN
INTRAVENOUS | Status: DC | PRN
  Administered 2023-04-04: 200 mg via INTRAVENOUS

## 2023-04-04 MED ORDER — ASPIRIN 81 MG PO CHEW
81.0000 mg | CHEWABLE_TABLET | Freq: Every day | ORAL | Status: DC
  Administered 2023-04-05 – 2023-04-06 (×2): 81 mg via ORAL
  Filled 2023-04-04 (×2): qty 1

## 2023-04-04 MED ORDER — SODIUM CHLORIDE (PF) 0.9 % IJ SOLN
INTRAVENOUS | Status: AC | PRN
  Administered 2023-04-04: 200 mg via INTRA_ARTERIAL

## 2023-04-04 MED ORDER — ACETAMINOPHEN 325 MG PO TABS
650.0000 mg | ORAL_TABLET | ORAL | Status: DC | PRN
  Administered 2023-04-05: 650 mg via ORAL
  Filled 2023-04-04: qty 2

## 2023-04-04 MED ORDER — ONDANSETRON HCL 4 MG/2ML IJ SOLN
INTRAMUSCULAR | Status: DC | PRN
  Administered 2023-04-04: 4 mg via INTRAVENOUS

## 2023-04-04 MED ORDER — DEXAMETHASONE SODIUM PHOSPHATE 10 MG/ML IJ SOLN
INTRAMUSCULAR | Status: DC | PRN
  Administered 2023-04-04: 4 mg via INTRAVENOUS

## 2023-04-04 MED ORDER — HEPARIN SODIUM (PORCINE) 1000 UNIT/ML IJ SOLN
INTRAMUSCULAR | Status: AC | PRN
  Administered 2023-04-04: 2000 [IU] via INTRAVENOUS

## 2023-04-04 MED ORDER — LABETALOL HCL 5 MG/ML IV SOLN
INTRAVENOUS | Status: DC | PRN
  Administered 2023-04-04 (×2): 5 mg via INTRAVENOUS

## 2023-04-04 MED ORDER — ROCURONIUM BROMIDE 10 MG/ML (PF) SYRINGE
PREFILLED_SYRINGE | INTRAVENOUS | Status: DC | PRN
  Administered 2023-04-04: 50 mg via INTRAVENOUS
  Administered 2023-04-04: 80 mg via INTRAVENOUS
  Administered 2023-04-04: 20 mg via INTRAVENOUS

## 2023-04-04 MED ORDER — EPTIFIBATIDE 20 MG/10ML IV SOLN
INTRAVENOUS | Status: AC
  Filled 2023-04-04: qty 10

## 2023-04-04 MED ORDER — ACETAMINOPHEN 160 MG/5ML PO SOLN: 650.0000 mg | ORAL | Status: DC | PRN

## 2023-04-04 MED ORDER — HEPARIN (PORCINE) 25000 UT/250ML-% IV SOLN
800.0000 [IU]/h | INTRAVENOUS | Status: AC
  Administered 2023-04-04: 800 [IU]/h via INTRAVENOUS

## 2023-04-04 MED ORDER — PROPOFOL 10 MG/ML IV BOLUS
INTRAVENOUS | Status: DC | PRN
  Administered 2023-04-04: 20 mg via INTRAVENOUS
  Administered 2023-04-04: 30 mg via INTRAVENOUS
  Administered 2023-04-04: 150 mg via INTRAVENOUS

## 2023-04-04 MED ORDER — FENTANYL CITRATE (PF) 100 MCG/2ML IJ SOLN
INTRAMUSCULAR | Status: AC
  Filled 2023-04-04: qty 2

## 2023-04-04 MED ORDER — HEPARIN (PORCINE) 25000 UT/250ML-% IV SOLN: 500.0000 [IU]/h | INTRAVENOUS | Status: DC

## 2023-04-04 MED ORDER — HEPARIN (PORCINE) 25000 UT/250ML-% IV SOLN
INTRAVENOUS | Status: AC
  Filled 2023-04-04: qty 250

## 2023-04-04 MED ORDER — EPTIFIBATIDE BOLUS VIA INFUSION
INTRAVENOUS | Status: AC | PRN
  Administered 2023-04-04: 4.5 mg via INTRAVENOUS

## 2023-04-04 MED ORDER — PHENYLEPHRINE 80 MCG/ML (10ML) SYRINGE FOR IV PUSH (FOR BLOOD PRESSURE SUPPORT)
PREFILLED_SYRINGE | INTRAVENOUS | Status: DC | PRN
  Administered 2023-04-04: 160 ug via INTRAVENOUS
  Administered 2023-04-04: 80 ug via INTRAVENOUS
  Administered 2023-04-04 (×2): 160 ug via INTRAVENOUS

## 2023-04-04 MED ORDER — HEPARIN SODIUM (PORCINE) 1000 UNIT/ML IJ SOLN
INTRAMUSCULAR | Status: DC | PRN
  Administered 2023-04-04 (×2): 1000 [IU] via INTRAVENOUS

## 2023-04-04 MED ORDER — VERAPAMIL HCL 2.5 MG/ML IV SOLN
INTRAVENOUS | Status: AC
  Filled 2023-04-04: qty 2

## 2023-04-04 MED ORDER — TICAGRELOR 90 MG PO TABS
90.0000 mg | ORAL_TABLET | Freq: Two times a day (BID) | ORAL | Status: DC
  Administered 2023-04-04 – 2023-04-06 (×4): 90 mg via ORAL
  Filled 2023-04-04 (×4): qty 1

## 2023-04-04 NOTE — Progress Notes (Addendum)
STROKE TEAM PROGRESS NOTE   BRIEF HPI [name] is a 62 y.o. male with history of DM, HTN presenting with 2 weeks of left ear pain, tinnitus, and pain behind the left ear. Loaded with ASA 325mg  and Plavix 300mg  in ED.   NIH on Admission 1  SIGNIFICANT HOSPITAL EVENTS 12/17- admit to ICU 12/18- IR for cerebral angiogram 12/19- load with Brilinta  INTERIM HISTORY/SUBJECTIVE Patient has been hemodynamically stable, and his neurological exam remains within normal limits.  Plan is for stenting of the right vertebral artery today.  OBJECTIVE  CBC    Component Value Date/Time   WBC 8.0 04/02/2023 0552   RBC 5.96 (H) 04/02/2023 0552   HGB 16.8 04/02/2023 0552   HGB 15.9 07/27/2011 1426   HCT 50.1 04/02/2023 0552   HCT 54.3 (H) 03/06/2023 1449   PLT 229 04/02/2023 0552   PLT 240 07/27/2011 1426   MCV 84.1 04/02/2023 0552   MCV 88 07/27/2011 1426   MCH 28.2 04/02/2023 0552   MCHC 33.5 04/02/2023 0552   RDW 16.5 (H) 04/02/2023 0552   RDW 13.5 07/27/2011 1426   LYMPHSABS 2.5 04/01/2023 0828   MONOABS 0.6 04/01/2023 0828   EOSABS 0.4 04/01/2023 0828   BASOSABS 0.0 04/01/2023 0828    BMET    Component Value Date/Time   NA 137 04/01/2023 0828   NA 139 07/27/2011 1426   K 3.7 04/01/2023 0828   K 3.6 07/27/2011 1426   CL 101 04/01/2023 0828   CL 104 07/27/2011 1426   CO2 26 04/01/2023 0828   CO2 27 07/27/2011 1426   GLUCOSE 119 (H) 04/01/2023 0828   GLUCOSE 123 (H) 07/27/2011 1426   BUN 19 04/01/2023 0828   BUN 17 07/27/2011 1426   CREATININE 1.32 (H) 04/02/2023 0552   CREATININE 1.27 07/27/2011 1426   CALCIUM 9.0 04/01/2023 0828   CALCIUM 8.3 (L) 07/27/2011 1426   GFRNONAA >60 04/02/2023 0552   GFRNONAA >60 07/27/2011 1426    IMAGING past 24 hours No results found.   Vitals:   04/04/23 0700 04/04/23 0800 04/04/23 0900 04/04/23 0934  BP: (!) 140/90 (!) 164/102 (!) 152/89 (!) 166/96  Pulse: 67 71 71 68  Resp: (!) 21 15 18 16   Temp:  98.2 F (36.8 C)   98.7 F (37.1 C)  TempSrc:  Axillary    SpO2: 96% 93% 95% 96%  Weight:         PHYSICAL EXAM General:  Alert, mildly obese middle-age Caucasian male in no acute distress Psych:  Mood and affect appropriate for situation CV: Regular rate and rhythm on monitor Respiratory:  Regular, unlabored respirations on room air GI: Abdomen soft and nontender   NEURO:  Mental Status: AA&Ox3, patient is able to give clear and coherent history Speech/Language: speech is without dysarthria or aphasia.    Cranial Nerves:  II: PERRL.  III, IV, VI: EOMI. Eyelids elevate symmetrically.  Saccadic dysmetria on lateral gaze bilaterally.  No nystagmus V: Sensation is intact to light touch and symmetrical to face.  VII: Face is symmetrical resting and smiling VIII: hearing intact to voice. IX, X: Phonation is normal.  XII: tongue is midline without fasciculations. Motor: 5/5 strength to all muscle groups tested.  Tone: is normal and bulk is normal Sensation- Intact to light touch bilaterally.  Coordination: FTN intact bilaterally Gait- deferred  Most Recent NIH 0  Premorbid modified Rankin score 1   ASSESSMENT/PLAN  Acute Ischemic Infarct:  acute and subacute in brainstem  and left cerebellar hemisphere  Etiology:  large vessel disease  CT head - New age indeterminate infarct in the left occipital lobe  CTA head & neck Nonopacification of the left vertebral artery from its origin through the vertebrobasilar junction, which may be chronic. Poor opacification of the remainder of the posterior circulation, with fairly robust opacification the proximal right V4 and P1 segment, but poor opacification of the distal right V4, basilar, and more distal PCAs. Moderate stenosis in the proximal right supraclinoid ICA and mild stenosis in the proximal left supraclinoid ICA. MRI  Scattered foci of acute and subacute infarction throughout the brainstem and left cerebellar hemisphere. Loss of the distal vertebral  and basilar artery flow voids, suspicious for occlusion.  2D Echo 55-60% LDL 101 HgbA1c 5.2 VTE prophylaxis - Lovenox aspirin 81 mg daily prior to admission, now on brilinta 90mg  BID and ASA 81mg  Stop plavix load with brilinta, plan for IR 12/20 Therapy recommendations:  Outpatient PT/OT/ST Disposition:  Pending   Left vertebral artery stenosis Cerebral angiogram 12/18 with Dr. Corliss Skains  Near occlusion of the  right vertebral basilar junction just distal to the right posterior inferior cerebellar  artery with a string sign opacification of the proximal one third to half  of the basilar artery.  Distal one third of the basilar artery opacifies  retrogradely via a severely diseased left posterior communicating artery.   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 Plan for stenting 12/20- loaded with brilinta 12/19  Hypertension Home meds:  None Stable Blood Pressure Goal: BP less than 220/110   Hyperlipidemia Home meds:  None, resumed in hospital LDL 101, goal < 70 Add Crestor 40mg   Continue statin at discharge  Diabetes type II Controlled Home meds:  None HgbA1c 5.2, goal < 7.0 CBGs SSI Recommend close follow-up with PCP for better DM control  Dysphagia Patient has post-stroke dysphagia, SLP consulted    Diet   Diet NPO time specified   Advance diet as tolerated  Other Stroke Risk Factors ETOH use, alcohol level <10, advised to drink no more than 2 drink(s) a day Obesity, Body mass index is 33.82 kg/m., BMI >/= 30 associated with increased stroke risk, recommend weight loss, diet and exercise as appropriate   Hospital day # 2  Patient seen and examined by NP/APP with MD. MD to update note as needed.   Cortney E Ernestina Columbia , MSN, AGACNP-BC [facility] See Amion for schedule and pager information 04/04/2023 1:49 PM   I have personally obtained history,examined this patient, reviewed notes, independently viewed imaging  studies, participated in medical decision making and plan of care.ROS completed by me personally and pertinent positives fully documented  I have made any additions or clarifications directly to the above note. Agree with note above.  Plan basilar artery angioplasty stenting today by Dr. Corliss Skains.  Delia Heady, MD Medical Director [facility] Pager: [phone] 04/04/2023 3:00 PM  To contact Stroke Continuity provider, please refer to [email or web address]. After hours, contact General Neurology

## 2023-04-04 NOTE — H&P (Signed)
Chief Complaint: Patient was seen in consultation today for left vertebral artery occlusion; basilar artery stenosis.   Referring Physician(s): Dr. Pearlean Brownie  Supervising Physician: Julieanne Cotton  Patient Status: [facility] - In-pt  History of Present Illness: Gavin Dorsey is a 62 y.o. male with a history of DM and HTN. He presented to the ED with complaints of left ear pain, pain behind the left ear and tinnitus. This had been ongoing for two weeks and he then developed trouble with walking and balance. In the ED he was found to have scattered brainstem and cerebellar infarcts as well as left vertebral occlusion. He was seen in IR 04/02/23 for a diagnostic angiogram which showed: Near occlusion of the  right vertebral basilar junction just distal to the right posterior inferior cerebellar  artery with a string sign opacification of the proximal one third to half  of the basilar artery.  Distal one third of the basilar artery opacifies  retrogradely via a severely diseased left posterior communicating artery.   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  Dr. Corliss Skains discussed these findings with the patient and made the recommendation for cerebral intervention with arterial stent placement. The patient was in agreement to proceed. Patient is scheduled today for an image-guided diagnostic cerebral angiogram with possible cerebral artery stent placement. Procedure will be done under general anesthesia.    Past Medical History:  Diagnosis Date   Diabetes mellitus without complication (HCC)    Hypertension     Past Surgical History:  Procedure Laterality Date   IR ANGIO INTRA EXTRACRAN SEL COM CAROTID INNOMINATE BILAT MOD SED  04/02/2023   IR ANGIO VERTEBRAL SEL SUBCLAVIAN INNOMINATE UNI L MOD SED  04/02/2023   IR ANGIO VERTEBRAL SEL VERTEBRAL UNI R MOD SED  04/02/2023   IR US GUIDE VASC ACCESS RIGHT  04/02/2023    Allergies: Patient has no known  allergies.  Medications: Prior to Admission medications   Medication Sig Start Date End Date Taking? Authorizing Provider  AMBULATORY NON FORMULARY MEDICATION Testosterone Cream 20%  Apply 0.5cc daily  Quantity: 30ml Refill 5  [facility] Dr. Jerlyn Ly [phone] 08/19/22   Riki Altes, MD  diazepam (VALIUM) 5 MG tablet Take 5 mg by mouth 2 (two) times daily as needed for anxiety.    [provider]  tadalafil (CIALIS) 5 MG tablet Take 1 tablet (5 mg total) by mouth daily as needed for erectile dysfunction. 01/02/23   Stoioff, Verna Czech, MD     No family history on file.  Social History   Socioeconomic History   Marital status: Single    Spouse name: Not on file   Number of children: Not on file   Years of education: Not on file   Highest education level: Not on file  Occupational History   Not on file  Tobacco Use   Smoking status: Never   Smokeless tobacco: Never  Substance and Sexual Activity   Alcohol use: Yes   Drug use: Never   Sexual activity: Yes  Other Topics Concern   Not on file  Social History Narrative   Not on file   Social Drivers of Health   Financial Resource Strain: Not on file  Food Insecurity: No Food Insecurity (04/02/2023)   Hunger Vital Sign    Worried About Running Out of Food in the Last Year: Never true    Ran Out of Food in the Last Year: Never  true  Transportation Needs: No Transportation Needs (04/02/2023)   PRAPARE - Administrator, Civil Service (Medical): No    Lack of Transportation (Non-Medical): No  Physical Activity: Not on file  Stress: Not on file  Social Connections: Not on file    Review of Systems: A 12 point ROS discussed and pertinent positives are indicated in the HPI above.  All other systems are negative.  Review of Systems  Constitutional:  Positive for fatigue. Negative for appetite change.  HENT:  Positive for tinnitus.    Respiratory:  Negative for cough and shortness of breath.   Gastrointestinal:  Negative for diarrhea, nausea and vomiting.  Musculoskeletal:  Negative for back pain.  Neurological:  Negative for dizziness and headaches.    Vital Signs: 98.7, 166/96,   Physical Exam Constitutional:      General: He is not in acute distress.    Appearance: He is not ill-appearing.  HENT:     Mouth/Throat:     Mouth: Mucous membranes are moist.     Pharynx: Oropharynx is clear.  Eyes:     General: No visual field deficit. Cardiovascular:     Rate and Rhythm: Normal rate and regular rhythm.     Pulses: Normal pulses.  Pulmonary:     Effort: Pulmonary effort is normal.  Abdominal:     Palpations: Abdomen is soft.     Tenderness: There is no abdominal tenderness.  Skin:    General: Skin is warm and dry.  Neurological:     Mental Status: He is alert and oriented to person, place, and time.     Cranial Nerves: No cranial nerve deficit, dysarthria or facial asymmetry.  Psychiatric:        Mood and Affect: Mood normal.        Behavior: Behavior normal.        Thought Content: Thought content normal.        Judgment: Judgment normal.     Imaging: IR US Guide Vasc Access Right Result Date: 04/04/2023 CLINICAL DATA:  Vertebrobasilar ischemic symptoms of gait instability, lack of balance, and ischemic strokes involving the left cerebellar hemisphere. EXAM: IR ANGIO VERTEBRAL SEL VERTEBRAL UNI RIGHT MOD SED COMPARISON:  CT angiogram of the head and neck of December 17th and MRI of the brain of December 17th, 2024. MEDICATIONS: Heparin 2000 units IV. None antibiotic was administered within 1 hour of the procedure. ANESTHESIA/SEDATION: Versed 1 mg IV; Fentanyl 25 mcg IV Moderate Sedation Time:  28 minutes The patient was continuously monitored during the procedure by the interventional radiology nurse under my direct supervision. CONTRAST:  Omnipaque 300 approximately 65 mL. FLUOROSCOPY TIME:  Fluoroscopy  Time: 8 minutes 54 seconds (777 mGy). COMPLICATIONS: None immediate. TECHNIQUE: Informed written consent was obtained from the patient after a thorough discussion of the procedural risks, benefits and alternatives. All questions were addressed. Maximal Sterile Barrier Technique was utilized including caps, mask, sterile gowns, sterile gloves, sterile drape, hand hygiene and skin antiseptic. A timeout was performed prior to the initiation of the procedure. The right forearm to the wrist was prepped and draped in the usual sterile manner. The right radial artery was identified, and its morphology documented in the radiology PACS system. A dorsal palmar anastomosis was verified to be present. Using ultrasound guidance, a 4/5 French sheath was then placed over an 018 inch micro guidewire. The obturator, and the micro guidewire were removed. Good aspiration was obtained from the side port of the radial sheath.  A cocktail of 2000 units of heparin, 200 mcg of nitroglycerin, and 2.5 mg verapamil was infused in diluted form without event. A right radial arteriogram was performed. Over an 035 inch Roadrunner guidewire, a 5 French Simmons 2 diagnostic catheter was advanced to the aortic arch region, and selectively positioned in the right vertebral artery, the right common carotid artery and the left common carotid artery. A wrist band was applied for hemostasis at the right radial puncture site. Distal right pulse was verified to be present. FINDINGS: The dominant right vertebral artery origin is widely patent. The vessel is seen to opacify to the cranial skull base. Patency is seen of the right posterior-inferior cerebellar artery and the right vertebrobasilar junction. More distally, there is a severe pre occlusive stenosis of the proximal basilar artery proximal to the origins of the anterior-inferior cerebellar arteries. The left subclavian arteriogram demonstrates occluded left vertebral artery at its origin without  distal reconstitution from the thyrocervical trunk branches. The left common carotid arteriogram demonstrates the left external carotid artery and its major branches to be widely patent. The left internal carotid artery at the bulb to the cranial skull base demonstrates wide patency with mild tortuosity involving the distal 1/3 of the cervical left ICA. The petrous, the cavernous and the supraclinoid left ICA are widely patent. The left middle cerebral artery and left anterior cerebral artery opacify into the capillary and venous phases. Prompt cross-filling via the anterior communicating artery of the right anterior cerebral artery A2 segment is evident. The delayed arterial run demonstrates a diminutive left posterior communicating artery which retrogradely opacifies a small caliber distal basilar artery. The right common carotid arteriogram reveals the right external carotid artery and its major branches to be widely patent. The right internal carotid artery at the bulb to the cranial skull base is widely patent with moderate tortuosity involving its mid 1/3. The petrous, the cavernous and the supraclinoid right ICA are widely patent with mild stenosis involving the supraclinoid ICA. The right middle cerebral artery and the right anterior cerebral artery opacify into the capillary and venous phases. 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 Distal 1/3 of the basilar artery filling retrogradely via a diminutive left posterior communicating artery. 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 PLAN: Findings reviewed with patient and the patient's neurologist. Electronically Signed   By: Julieanne Cotton M.D.   On: 04/04/2023 07:53   IR ANGIO INTRA EXTRACRAN SEL COM CAROTID INNOMINATE BILAT MOD SED Result Date: 04/04/2023 CLINICAL DATA:  Vertebrobasilar ischemic symptoms of gait instability, lack of balance, and ischemic strokes involving the left  cerebellar hemisphere. EXAM: IR ANGIO VERTEBRAL SEL VERTEBRAL UNI RIGHT MOD SED COMPARISON:  CT angiogram of the head and neck of December 17th and MRI of the brain of December 17th, 2024. MEDICATIONS: Heparin 2000 units IV. None antibiotic was administered within 1 hour of the procedure. ANESTHESIA/SEDATION: Versed 1 mg IV; Fentanyl 25 mcg IV Moderate Sedation Time:  28 minutes The patient was continuously monitored during the procedure by the interventional radiology nurse under my direct supervision. CONTRAST:  Omnipaque 300 approximately 65 mL. FLUOROSCOPY TIME:  Fluoroscopy Time: 8 minutes 54 seconds (777 mGy). COMPLICATIONS: None immediate. TECHNIQUE: Informed written consent was obtained from the patient after a thorough discussion of the procedural risks, benefits and alternatives. All questions were addressed. Maximal Sterile Barrier Technique was utilized including caps, mask, sterile gowns, sterile gloves, sterile drape, hand hygiene and skin antiseptic. A timeout was performed prior  to the initiation of the procedure. The right forearm to the wrist was prepped and draped in the usual sterile manner. The right radial artery was identified, and its morphology documented in the radiology PACS system. A dorsal palmar anastomosis was verified to be present. Using ultrasound guidance, a 4/5 French sheath was then placed over an 018 inch micro guidewire. The obturator, and the micro guidewire were removed. Good aspiration was obtained from the side port of the radial sheath. A cocktail of 2000 units of heparin, 200 mcg of nitroglycerin, and 2.5 mg verapamil was infused in diluted form without event. A right radial arteriogram was performed. Over an 035 inch Roadrunner guidewire, a 5 French Simmons 2 diagnostic catheter was advanced to the aortic arch region, and selectively positioned in the right vertebral artery, the right common carotid artery and the left common carotid artery. A wrist band was applied for  hemostasis at the right radial puncture site. Distal right pulse was verified to be present. FINDINGS: The dominant right vertebral artery origin is widely patent. The vessel is seen to opacify to the cranial skull base. Patency is seen of the right posterior-inferior cerebellar artery and the right vertebrobasilar junction. More distally, there is a severe pre occlusive stenosis of the proximal basilar artery proximal to the origins of the anterior-inferior cerebellar arteries. The left subclavian arteriogram demonstrates occluded left vertebral artery at its origin without distal reconstitution from the thyrocervical trunk branches. The left common carotid arteriogram demonstrates the left external carotid artery and its major branches to be widely patent. The left internal carotid artery at the bulb to the cranial skull base demonstrates wide patency with mild tortuosity involving the distal 1/3 of the cervical left ICA. The petrous, the cavernous and the supraclinoid left ICA are widely patent. The left middle cerebral artery and left anterior cerebral artery opacify into the capillary and venous phases. Prompt cross-filling via the anterior communicating artery of the right anterior cerebral artery A2 segment is evident. The delayed arterial run demonstrates a diminutive left posterior communicating artery which retrogradely opacifies a small caliber distal basilar artery. The right common carotid arteriogram reveals the right external carotid artery and its major branches to be widely patent. The right internal carotid artery at the bulb to the cranial skull base is widely patent with moderate tortuosity involving its mid 1/3. The petrous, the cavernous and the supraclinoid right ICA are widely patent with mild stenosis involving the supraclinoid ICA. The right middle cerebral artery and the right anterior cerebral artery opacify into the capillary and venous phases. IMPRESSION: Severe pre occlusive stenosis  of the proximal basilar artery. Distal 1/3 of the basilar artery filling retrogradely via a diminutive left posterior communicating artery. 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 PLAN: Findings reviewed with patient and the patient's neurologist. Electronically Signed   By: Julieanne Cotton M.D.   On: 04/04/2023 07:53   IR ANGIO VERTEBRAL SEL VERTEBRAL UNI R MOD SED Result Date: 04/04/2023 CLINICAL DATA:  Vertebrobasilar ischemic symptoms of gait instability, lack of balance, and ischemic strokes involving the left cerebellar hemisphere. EXAM: IR ANGIO VERTEBRAL SEL VERTEBRAL UNI RIGHT MOD SED COMPARISON:  CT angiogram of the head and neck of December 17th and MRI of the brain of December 17th, 2024. MEDICATIONS: Heparin 2000 units IV. None antibiotic was administered within 1 hour of the procedure. ANESTHESIA/SEDATION: Versed 1 mg IV; Fentanyl 25 mcg IV Moderate Sedation Time:  28 minutes The patient was continuously monitored during the procedure  by the interventional radiology nurse under my direct supervision. CONTRAST:  Omnipaque 300 approximately 65 mL. FLUOROSCOPY TIME:  Fluoroscopy Time: 8 minutes 54 seconds (777 mGy). COMPLICATIONS: None immediate. TECHNIQUE: Informed written consent was obtained from the patient after a thorough discussion of the procedural risks, benefits and alternatives. All questions were addressed. Maximal Sterile Barrier Technique was utilized including caps, mask, sterile gowns, sterile gloves, sterile drape, hand hygiene and skin antiseptic. A timeout was performed prior to the initiation of the procedure. The right forearm to the wrist was prepped and draped in the usual sterile manner. The right radial artery was identified, and its morphology documented in the radiology PACS system. A dorsal palmar anastomosis was verified to be present. Using ultrasound guidance, a 4/5 French sheath was then placed over an 018 inch micro guidewire. The  obturator, and the micro guidewire were removed. Good aspiration was obtained from the side port of the radial sheath. A cocktail of 2000 units of heparin, 200 mcg of nitroglycerin, and 2.5 mg verapamil was infused in diluted form without event. A right radial arteriogram was performed. Over an 035 inch Roadrunner guidewire, a 5 French Simmons 2 diagnostic catheter was advanced to the aortic arch region, and selectively positioned in the right vertebral artery, the right common carotid artery and the left common carotid artery. A wrist band was applied for hemostasis at the right radial puncture site. Distal right pulse was verified to be present. FINDINGS: The dominant right vertebral artery origin is widely patent. The vessel is seen to opacify to the cranial skull base. Patency is seen of the right posterior-inferior cerebellar artery and the right vertebrobasilar junction. More distally, there is a severe pre occlusive stenosis of the proximal basilar artery proximal to the origins of the anterior-inferior cerebellar arteries. The left subclavian arteriogram demonstrates occluded left vertebral artery at its origin without distal reconstitution from the thyrocervical trunk branches. The left common carotid arteriogram demonstrates the left external carotid artery and its major branches to be widely patent. The left internal carotid artery at the bulb to the cranial skull base demonstrates wide patency with mild tortuosity involving the distal 1/3 of the cervical left ICA. The petrous, the cavernous and the supraclinoid left ICA are widely patent. The left middle cerebral artery and left anterior cerebral artery opacify into the capillary and venous phases. Prompt cross-filling via the anterior communicating artery of the right anterior cerebral artery A2 segment is evident. The delayed arterial run demonstrates a diminutive left posterior communicating artery which retrogradely opacifies a small caliber distal  basilar artery. The right common carotid arteriogram reveals the right external carotid artery and its major branches to be widely patent. The right internal carotid artery at the bulb to the cranial skull base is widely patent with moderate tortuosity involving its mid 1/3. The petrous, the cavernous and the supraclinoid right ICA are widely patent with mild stenosis involving the supraclinoid ICA. The right middle cerebral artery and the right anterior cerebral artery opacify into the capillary and venous phases. 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 Distal 1/3 of the basilar artery filling retrogradely via a diminutive left posterior communicating artery. 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 PLAN: Findings reviewed with patient and the patient's neurologist. Electronically Signed   By: Julieanne Cotton M.D.   On: 04/04/2023 07:53   IR ANGIO VERTEBRAL SEL SUBCLAVIAN INNOMINATE UNI L MOD SED Result Date: 04/04/2023 CLINICAL DATA:  Vertebrobasilar ischemic symptoms of gait instability,  lack of balance, and ischemic strokes involving the left cerebellar hemisphere. EXAM: IR ANGIO VERTEBRAL SEL VERTEBRAL UNI RIGHT MOD SED COMPARISON:  CT angiogram of the head and neck of December 17th and MRI of the brain of December 17th, 2024. MEDICATIONS: Heparin 2000 units IV. None antibiotic was administered within 1 hour of the procedure. ANESTHESIA/SEDATION: Versed 1 mg IV; Fentanyl 25 mcg IV Moderate Sedation Time:  28 minutes The patient was continuously monitored during the procedure by the interventional radiology nurse under my direct supervision. CONTRAST:  Omnipaque 300 approximately 65 mL. FLUOROSCOPY TIME:  Fluoroscopy Time: 8 minutes 54 seconds (777 mGy). COMPLICATIONS: None immediate. TECHNIQUE: Informed written consent was obtained from the patient after a thorough discussion of the procedural risks, benefits and alternatives. All  questions were addressed. Maximal Sterile Barrier Technique was utilized including caps, mask, sterile gowns, sterile gloves, sterile drape, hand hygiene and skin antiseptic. A timeout was performed prior to the initiation of the procedure. The right forearm to the wrist was prepped and draped in the usual sterile manner. The right radial artery was identified, and its morphology documented in the radiology PACS system. A dorsal palmar anastomosis was verified to be present. Using ultrasound guidance, a 4/5 French sheath was then placed over an 018 inch micro guidewire. The obturator, and the micro guidewire were removed. Good aspiration was obtained from the side port of the radial sheath. A cocktail of 2000 units of heparin, 200 mcg of nitroglycerin, and 2.5 mg verapamil was infused in diluted form without event. A right radial arteriogram was performed. Over an 035 inch Roadrunner guidewire, a 5 French Simmons 2 diagnostic catheter was advanced to the aortic arch region, and selectively positioned in the right vertebral artery, the right common carotid artery and the left common carotid artery. A wrist band was applied for hemostasis at the right radial puncture site. Distal right pulse was verified to be present. FINDINGS: The dominant right vertebral artery origin is widely patent. The vessel is seen to opacify to the cranial skull base. Patency is seen of the right posterior-inferior cerebellar artery and the right vertebrobasilar junction. More distally, there is a severe pre occlusive stenosis of the proximal basilar artery proximal to the origins of the anterior-inferior cerebellar arteries. The left subclavian arteriogram demonstrates occluded left vertebral artery at its origin without distal reconstitution from the thyrocervical trunk branches. The left common carotid arteriogram demonstrates the left external carotid artery and its major branches to be widely patent. The left internal carotid artery at  the bulb to the cranial skull base demonstrates wide patency with mild tortuosity involving the distal 1/3 of the cervical left ICA. The petrous, the cavernous and the supraclinoid left ICA are widely patent. The left middle cerebral artery and left anterior cerebral artery opacify into the capillary and venous phases. Prompt cross-filling via the anterior communicating artery of the right anterior cerebral artery A2 segment is evident. The delayed arterial run demonstrates a diminutive left posterior communicating artery which retrogradely opacifies a small caliber distal basilar artery. The right common carotid arteriogram reveals the right external carotid artery and its major branches to be widely patent. The right internal carotid artery at the bulb to the cranial skull base is widely patent with moderate tortuosity involving its mid 1/3. The petrous, the cavernous and the supraclinoid right ICA are widely patent with mild stenosis involving the supraclinoid ICA. The right middle cerebral artery and the right anterior cerebral artery opacify into the capillary and venous phases.  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 Distal 1/3 of the basilar artery filling retrogradely via a diminutive left posterior communicating artery. 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 PLAN: Findings reviewed with patient and the patient's neurologist. Electronically Signed   By: Julieanne Cotton M.D.   On: 04/04/2023 07:53   ECHOCARDIOGRAM COMPLETE Result Date: 04/02/2023    ECHOCARDIOGRAM REPORT   Patient Name:   Gavin Dorsey Date of Exam: 04/02/2023 Medical Rec #:  [id number]      Height:       68.0 in Accession #:    [id number]     Weight:       222.4 lb Date of Birth:  01/22/1961      BSA:          2.138 m Patient Age:    62 years       BP:           150/94 mmHg Patient Gender: M              HR:           69 bpm. Exam Location:  Inpatient Procedure: 2D  Echo, Cardiac Doppler and Color Doppler Indications:    stroke  History:        Patient has no prior history of Echocardiogram examinations.                 Risk Factors:Diabetes and Hypertension.  Sonographer:    Delcie Roch RDCS Referring Phys: [id number] [facility] IMPRESSIONS  1. Left ventricular ejection fraction, by estimation, is 55 to 60%. The left ventricle has normal function. The left ventricle has no regional wall motion abnormalities. There is moderate concentric left ventricular hypertrophy. Left ventricular diastolic parameters are consistent with Grade I diastolic dysfunction (impaired relaxation).  2. Right ventricular systolic function is normal. The right ventricular size is normal. Tricuspid regurgitation signal is inadequate for assessing PA pressure.  3. The mitral valve is normal in structure. No evidence of mitral valve regurgitation. No evidence of mitral stenosis.  4. The aortic valve is tricuspid. There is mild calcification of the aortic valve. Aortic valve regurgitation is not visualized. No aortic stenosis is present.  5. Aortic dilatation noted. There is mild dilatation of the ascending aorta, measuring 43 mm.  6. The inferior vena cava is normal in size with greater than 50% respiratory variability, suggesting right atrial pressure of 3 mmHg. FINDINGS  Left Ventricle: Left ventricular ejection fraction, by estimation, is 55 to 60%. The left ventricle has normal function. The left ventricle has no regional wall motion abnormalities. The left ventricular internal cavity size was normal in size. There is  moderate concentric left ventricular hypertrophy. Left ventricular diastolic parameters are consistent with Grade I diastolic dysfunction (impaired relaxation). Right Ventricle: The right ventricular size is normal. No increase in right ventricular wall thickness. Right ventricular systolic function is normal. Tricuspid regurgitation signal is inadequate for assessing PA  pressure. Left Atrium: Left atrial size was normal in size. Right Atrium: Right atrial size was normal in size. Pericardium: There is no evidence of pericardial effusion. Mitral Valve: The mitral valve is normal in structure. Mild mitral annular calcification. No evidence of mitral valve regurgitation. No evidence of mitral valve stenosis. Tricuspid Valve: The tricuspid valve is normal in structure. Tricuspid valve regurgitation is not demonstrated. Aortic Valve: The aortic valve is tricuspid. There is mild calcification of the aortic valve. Aortic valve regurgitation is not visualized. No aortic  stenosis is present. Pulmonic Valve: The pulmonic valve was normal in structure. Pulmonic valve regurgitation is not visualized. Aorta: Aortic dilatation noted. There is mild dilatation of the ascending aorta, measuring 43 mm. Venous: The inferior vena cava is normal in size with greater than 50% respiratory variability, suggesting right atrial pressure of 3 mmHg. IAS/Shunts: No atrial level shunt detected by color flow Doppler.  LEFT VENTRICLE PLAX 2D LVIDd:         4.20 cm   Diastology LVIDs:         2.90 cm   LV e' medial:    4.90 cm/s LV PW:         1.20 cm   LV E/e' medial:  11.9 LV IVS:        1.40 cm   LV e' lateral:   10.40 cm/s LVOT diam:     2.10 cm   LV E/e' lateral: 5.6 LV SV:         59 LV SV Index:   28 LVOT Area:     3.46 cm  RIGHT VENTRICLE             IVC RV Basal diam:  2.80 cm     IVC diam: 1.70 cm RV S prime:     13.80 cm/s TAPSE (M-mode): 1.6 cm LEFT ATRIUM             Index        RIGHT ATRIUM           Index LA diam:        3.80 cm 1.78 cm/m   RA Area:     14.40 cm LA Vol (A2C):   44.3 ml 20.72 ml/m  RA Volume:   33.80 ml  15.81 ml/m LA Vol (A4C):   51.4 ml 24.04 ml/m LA Biplane Vol: 50.8 ml 23.76 ml/m  AORTIC VALVE LVOT Vmax:   98.50 cm/s LVOT Vmean:  65.500 cm/s LVOT VTI:    0.170 m  AORTA Ao Root diam: 3.50 cm Ao Asc diam:  4.30 cm MITRAL VALVE MV Area (PHT): 3.42 cm    SHUNTS MV Decel  Time: 222 msec    Systemic VTI:  0.17 m MV E velocity: 58.20 cm/s  Systemic Diam: 2.10 cm MV A velocity: 61.60 cm/s MV E/A ratio:  0.94 Dalton McleanMD Electronically signed by Wilfred Lacy Signature Date/Time: 04/02/2023/5:07:42 PM    Final    CT ANGIO HEAD NECK W WO CM Result Date: 04/01/2023 CLINICAL DATA:  Left-sided ear pain, speech changes, off balance; acute and subacute infarcts on MRI EXAM: CT ANGIOGRAPHY HEAD AND NECK WITH AND WITHOUT CONTRAST TECHNIQUE: Multidetector CT imaging of the head and neck was performed using the standard protocol during bolus administration of intravenous contrast. Multiplanar CT image reconstructions and MIPs were obtained to evaluate the vascular anatomy. Carotid stenosis measurements (when applicable) are obtained utilizing NASCET criteria, using the distal internal carotid diameter as the denominator. RADIATION DOSE REDUCTION: This exam was performed according to the departmental dose-optimization program which includes automated exposure control, adjustment of the mA and/or kV according to patient size and/or use of iterative reconstruction technique. CONTRAST:  75mL OMNIPAQUE IOHEXOL 350 MG/ML SOLN COMPARISON:  No prior CTA available, correlation is made with 04/01/2023 CT head and 04/01/2023 MRI head FINDINGS: CT HEAD FINDINGS For noncontrast findings, please see same day CT head. CTA NECK FINDINGS Aortic arch: Standard branching. Imaged portion shows no evidence of aneurysm or dissection. No significant stenosis of the major arch  vessel origins. Right carotid system: No evidence of dissection, occlusion, or hemodynamically significant stenosis (greater than 50%). Left carotid system: No evidence of dissection, occlusion, or hemodynamically significant stenosis (greater than 50%). Vertebral arteries: Nonopacification of the left vertebral artery from its origin through the skull base, which may be chronic. The right vertebral artery is patent from its origin to the  skull base without significant stenosis. Skeleton: No acute osseous abnormality. Degenerative changes in the cervical spine. Other neck: No acute finding. Upper chest: No focal pulmonary opacity or pleural effusion. Review of the MIP images confirms the above findings CTA HEAD FINDINGS Anterior circulation: Both internal carotid arteries are patent to the termini, with moderate stenosis in the proximal right supraclinoid ICA (series 6, images 10 9-110) and mild stenosis in the proximal left supraclinoid ICA (series 6, image 109). A1 segments patent. Normal anterior communicating artery. Anterior cerebral arteries are patent to their distal aspects without significant stenosis. No M1 stenosis or occlusion. MCA branches perfused to their distal aspects without significant stenosis. Posterior circulation: The left vertebral artery is not opacified in the V4 segment. The right vertebral artery is fairly well opacified in the proximal right V4 segment, with diminishing opacification in the more distal right V4 and at the vertebrobasilar junction. Poor opacification of basilar artery, which is not well seen. Better opacification in the left greater than right P1 segment, with poor opacification of the P2 segments and more distal PCAs. The posterior communicating arteries are not definitively seen but are likely patent given minimal opacification of the posterior circulation. Venous sinuses: Not well opacified due to phase of timing. Anatomic variants: None significant. No evidence of aneurysm or vascular malformation. Review of the MIP images confirms the above findings IMPRESSION: 1. Nonopacification of the left vertebral artery from its origin through the vertebrobasilar junction, which may be chronic. 2. Poor opacification of the remainder of the posterior circulation, with fairly robust opacification the proximal right V4 and P1 segment, but poor opacification of the distal right V4, basilar, and more distal PCAs. 3.  Moderate stenosis in the proximal right supraclinoid ICA and mild stenosis in the proximal left supraclinoid ICA. 4. No hemodynamically significant stenosis in the neck. These results will be called to the ordering clinician or representative by the Radiologist Assistant, and communication documented in the PACS or [employer]. Electronically Signed   By: Wiliam Ke M.D.   On: 04/01/2023 18:51   MR BRAIN WO CONTRAST Addendum Date: 04/01/2023 ADDENDUM REPORT: 04/01/2023 17:00 ADDENDUM: Critical Value/emergent results were called by telephone at the time of interpretation on 04/01/2023 at 4:58 pm to provider Dr. Larinda Buttery, who verbally acknowledged these results. Results were also discussed with the stroke neurologist on-call, Dr. Iver Nestle, at 4:39 p.m. Electronically Signed   By: Orvan Falconer M.D.   On: 04/01/2023 17:00   Result Date: 04/01/2023 CLINICAL DATA:  Neuro deficit, acute, stroke suspected. Balance difficulty. EXAM: MRI HEAD WITHOUT CONTRAST TECHNIQUE: Multiplanar, multiecho pulse sequences of the brain and surrounding structures were obtained without intravenous contrast. COMPARISON:  Head CT 04/01/2023. FINDINGS: Brain: Scattered foci of acute and subacute infarction throughout the brainstem and left cerebellar hemisphere. Acute hemorrhage or significant mass effect. Background of mild chronic small-vessel disease. The previously questioned left occipital lobe infarct is chronic. No hydrocephalus or extra-axial collection. No mass or abnormal susceptibility Vascular: Loss of the distal vertebral and basilar artery flow voids, suspicious for occlusion. Skull and upper cervical spine: Normal marrow signal. Sinuses/Orbits: No acute findings. Other: None.  IMPRESSION: 1. Scattered foci of acute and subacute infarction throughout the brainstem and left cerebellar hemisphere. 2. Loss of the distal vertebral and basilar artery flow voids, suspicious for occlusion. CTA of the head and neck is  recommended for confirmation. Radiology assistant personnel have been notified to put me in telephone contact with the referring physician or the referring physician's clinical representative in order to discuss these findings. Once this communication is established I will issue an addendum to this report for documentation purposes. Electronically Signed: By: Orvan Falconer M.D. On: 04/01/2023 16:23   CT HEAD WO CONTRAST Result Date: 04/01/2023 CLINICAL DATA:  Left-sided ear pain EXAM: CT HEAD WITHOUT CONTRAST TECHNIQUE: Contiguous axial images were obtained from the base of the skull through the vertex without intravenous contrast. RADIATION DOSE REDUCTION: This exam was performed according to the departmental dose-optimization program which includes automated exposure control, adjustment of the mA and/or kV according to patient size and/or use of iterative reconstruction technique. COMPARISON:  Head CT 10/31/22 FINDINGS: Brain: Compared to prior exam there is a new age indeterminate infarct in the left occipital lobe (series 3, image 17). No hemorrhage. No hydrocephalus. No extra-axial fluid collection. No mass effect. No mass lesion. Vascular: No hyperdense vessel or unexpected calcification. Skull: Mild soft tissue swelling along the midline posterior scalp. Calvarial fracture. No focal bone lesion. Sinuses/Orbits: No middle ear or mastoid effusion. Paranasal sinuses are notable for mucosal thickening in the right sphenoid and bilateral maxillary sinuses. Orbits are unremarkable. Other: None. IMPRESSION: New age indeterminate infarct in the left occipital lobe. Recommend brain MRI for further evaluation. Electronically Signed   By: Lorenza Cambridge M.D.   On: 04/01/2023 10:02    Labs:  CBC: Recent Labs    10/31/22 0805 11/04/22 1141 03/06/23 1449 04/01/23 0828 04/02/23 0552  WBC 8.1 10.3  --  7.7 8.0  HGB 14.9 15.6  --  18.0* 16.8  HCT 46.8 50.9 54.3* 55.3* 50.1  PLT 301 326  --  233 229     COAGS: Recent Labs    04/01/23 0828  INR 1.0  APTT 31    BMP: Recent Labs    10/31/22 0805 11/04/22 1141 04/01/23 0828 04/02/23 0552  NA 134* 137 137  --   K 4.1 4.2 3.7  --   CL 101 101 101  --   CO2 25 29 26   --   GLUCOSE 115* 109* 119*  --   BUN 19 16 19   --   CALCIUM 8.4* 8.8* 9.0  --   CREATININE 1.23 1.27* 1.33* 1.32*  GFRNONAA >60 >60 >60 >60    LIVER FUNCTION TESTS: Recent Labs    04/01/23 0828  BILITOT 0.8  AST 17  ALT 16  ALKPHOS 70  PROT 7.4  ALBUMIN 3.7    TUMOR MARKERS: No results for input(s): "AFPTM", "CEA", "CA199", "CHROMGRNA" in the last 8760 hours.  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 The procedure will be performed under general anesthesia.   Risks and benefits of this procedure were discussed with the patient including, but not limited to bleeding, infection, vascular injury or contrast induced renal failure.  This interventional procedure involves the use of X-rays and because of the nature of the planned procedure, it is possible that we will have prolonged use of X-ray fluoroscopy.  Potential radiation risks to you include (but  are not limited to) the following: - A slightly elevated risk for cancer  several years later in life. This risk is typically less than 0.5% percent. This risk is low in comparison to the normal incidence of human cancer, which is 33% for women and 50% for men according to the [employer] Society. - Radiation induced injury can include skin redness, resembling a rash, tissue breakdown / ulcers and hair loss (which can be temporary or permanent).   The likelihood of either of these occurring depends on the difficulty of the procedure and whether you are sensitive to radiation due to previous procedures, disease,  or genetic conditions.   IF your procedure requires a prolonged use of radiation, you will be notified and given written instructions for further action.  It is your responsibility to monitor the irradiated area for the 2 weeks following the procedure and to notify your physician if you are concerned that you have suffered a radiation induced injury.    All of the patient's questions were answered, patient is agreeable to proceed. He has been NPO. He received 81 mg aspirin and 90 mg brilinta this morning.   Consent signed and in Chart.  Thank you for this interesting consult.  I greatly enjoyed meeting Gavin Dorsey and look forward to participating in their care.  A copy of this report was sent to the requesting provider on this date.  Electronically Signed: Alwyn Ren, AGACNP-BC [phone] 04/04/2023, 9:35 AM   I spent a total of 20 Minutes    in face to face in clinical consultation, greater than 50% of which was counseling/coordinating care for cerebral artery occlusion/stenosis.

## 2023-04-04 NOTE — Transfer of Care (Signed)
Immediate Anesthesia Transfer of Care Note  Patient: Gavin Dorsey  Procedure(s) Performed: basilar artery angioplasty  Patient Location: PACU  Anesthesia Type:General  Level of Consciousness: awake, alert , and oriented  Airway & Oxygen Therapy: Patient Spontanous Breathing and Patient connected to face mask oxygen  Post-op Assessment: Report given to RN and Post -op Vital signs reviewed and stable  Post vital signs: Reviewed and stable  Last Vitals:  Vitals Value Taken Time  BP 114/65 04/04/23 1815  Temp    Pulse 74 04/04/23 1820  Resp 20 04/04/23 1820  SpO2 94 % 04/04/23 1820  Vitals shown include unfiled device data.  Last Pain:  Vitals:   04/04/23 1800  TempSrc:   PainSc: 0-No pain         Complications: No notable events documented.

## 2023-04-04 NOTE — Sedation Documentation (Addendum)
Act 164 Redo ACT 193

## 2023-04-04 NOTE — Progress Notes (Signed)
PT Cancellation Note  Patient Details Name: Gavin Dorsey MRN: [id number] DOB: 1960/10/03   Cancelled Treatment:    Reason Eval/Treat Not Completed: Patient at procedure or test/unavailable.  Pt still out of room for basilar artery angiogram then bedrest.  Will not be able to see pt today. 04/04/2023  Jacinto Halim., PT Acute Rehabilitation Services [phone]  (office)   Eliseo Gum [name] 04/04/2023, 4:48 PM

## 2023-04-04 NOTE — Progress Notes (Signed)
SLP Cancellation Note  Patient Details Name: Gavin Dorsey MRN: [id number] DOB: Jan 26, 1961   Cancelled treatment:       Reason Eval/Treat Not Completed: Patient at procedure or test/unavailable    Mahala Menghini., M.A. CCC-SLP Acute Rehabilitation Services Office [phone]  Secure chat preferred  04/04/2023, 11:13 AM

## 2023-04-04 NOTE — Sedation Documentation (Addendum)
Patient under the care of anesthesia. Patient moved to procedure table by staff , secured to table and hooked to monitors. Please see CRNA charting for vitals and patient care.

## 2023-04-04 NOTE — Anesthesia Preprocedure Evaluation (Addendum)
Anesthesia Evaluation  Patient identified by MRN, date of birth, ID band Patient awake    Reviewed: Allergy & Precautions, NPO status , Patient's Chart, lab work & pertinent test results  Airway Mallampati: II  TM Distance: >3 FB Neck ROM: Full    Dental no notable dental hx. (+) Teeth Intact, Dental Advisory Given   Pulmonary neg pulmonary ROS   Pulmonary exam normal breath sounds clear to auscultation       Cardiovascular hypertension, Normal cardiovascular exam Rhythm:Regular Rate:Normal  TTE 2024  1. Left ventricular ejection fraction, by estimation, is 55 to 60%. The  left ventricle has normal function. The left ventricle has no regional  wall motion abnormalities. There is moderate concentric left ventricular  hypertrophy. Left ventricular  diastolic parameters are consistent with Grade I diastolic dysfunction  (impaired relaxation).   2. Right ventricular systolic function is normal. The right ventricular  size is normal. Tricuspid regurgitation signal is inadequate for assessing  PA pressure.   3. The mitral valve is normal in structure. No evidence of mitral valve  regurgitation. No evidence of mitral stenosis.   4. The aortic valve is tricuspid. There is mild calcification of the  aortic valve. Aortic valve regurgitation is not visualized. No aortic  stenosis is present.   5. Aortic dilatation noted. There is mild dilatation of the ascending  aorta, measuring 43 mm.   6. The inferior vena cava is normal in size with greater than 50%  respiratory variability, suggesting right atrial pressure of 3 mmHg.     Neuro/Psych CVA, No Residual Symptoms  negative psych ROS   GI/Hepatic negative GI ROS, Neg liver ROS,,,  Endo/Other  diabetes, Type 2    Renal/GU negative Renal ROS  negative genitourinary   Musculoskeletal negative musculoskeletal ROS (+)    Abdominal   Peds  Hematology negative hematology ROS (+)    Anesthesia Other Findings Basilar artery stenosis  Reproductive/Obstetrics                             Anesthesia Physical Anesthesia Plan  ASA: 2  Anesthesia Plan: General   Post-op Pain Management:    Induction: Intravenous  PONV Risk Score and Plan: 2 and Midazolam, Dexamethasone and Ondansetron  Airway Management Planned: Oral ETT  Additional Equipment:   Intra-op Plan:   Post-operative Plan: Extubation in OR  Informed Consent: I have reviewed the patients History and Physical, chart, labs and discussed the procedure including the risks, benefits and alternatives for the proposed anesthesia with the patient or authorized representative who has indicated his/her understanding and acceptance.   Patient has DNR.  Discussed DNR with patient and Suspend DNR.   Dental advisory given  Plan Discussed with: CRNA  Anesthesia Plan Comments:        Anesthesia Quick Evaluation

## 2023-04-04 NOTE — TOC CM/SW Note (Signed)
Transition of Care [facility]) - Inpatient Brief Assessment   Patient Details  Name: Gavin Dorsey MRN: [id number] Date of Birth: Mar 03, 1961  Transition of [facility]) CM/SW Contact:    Glennon Mac, RN Phone Number: 04/04/2023, 5:17 PM   Clinical Narrative: Gavin Dorsey is a 62 y.o. male with a history of DM and HTN. He presented to the ED with complaints of left ear pain, pain behind the left ear and tinnitus. This had been ongoing for two weeks and he then developed trouble with walking and balance. In the ED he was found to have scattered brainstem and cerebellar infarcts as well as left vertebral occlusion. He was seen in IR 04/02/23 for a diagnostic angiogram which showed: Near occlusion of the  right vertebral basilar junction just distal to the right posterior inferior cerebellar  artery with a string sign opacification of the proximal one third to half  of the basilar artery.  Distal one third of the basilar artery opacifies  retrogradely via a severely diseased left posterior communicating artery.   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  PTA, pt independent and living at home alone. TOC will continue to follow for home needs.     Transition of Care Asessment: Insurance and Status: Selfpay Patient has primary care physician: Yes (Dr. Chalmers Guest) Home environment has been reviewed: Lives alone Prior level of function:: Independent Prior/Current Home Services: No current home services Social Drivers of Health Review: SDOH reviewed no interventions necessary Readmission risk has been reviewed: No Transition of care needs: transition of care needs identified, TOC will continue to follow   Quintella Baton, RN, BSN  Trauma/Neuro ICU Case Manager [phone]

## 2023-04-04 NOTE — Procedures (Signed)
INR.  S/P Right vertebral arteriogram.    Right radial approach   Status post revascularization of occluded proximal to mid basilar artery with stent assisted angioplasty achieving a TICI  3 revascularization.  Post CT brain no hemorrhage complications.  Wrist band applied at the right radial puncture site for hemostasis.  Distal right radial pulse verified to be present.  Patient's general anesthesia reversed, patient extubated.  Awake.  Following simple commands appropriately.  Denies any nausea vomiting or visual symptoms.  Moves all 4s spontaneously and to command.  No facial asymmetry.  Tongue midline.  Fatima Sanger MD.  Fatima Sanger MD

## 2023-04-04 NOTE — Progress Notes (Signed)
CCC Pre-op Review  Pre-op checklist: To be completed by bedside RN  NPO: Since 2130 per Posey Boyer, RN nightshift RN  Labs: PCR Neg, A1c 5.2  Consent: Done at bedside with PA  H&P: Hospitalist 12/17  Vitals: elevated bp  O2 requirements: 2L [place]  MAR/PTA review: No GLPs, No BB,   IV: 22G  Floor nurse name:    Additional info:   Tele, Pt is diabetic. CBGs not being checked on the floor and pt is not on any medication.

## 2023-04-04 NOTE — Anesthesia Procedure Notes (Signed)
Procedure Name: Intubation Date/Time: 04/04/2023 3:13 PM  Performed by: Sandie Ano, CRNAPre-anesthesia Checklist: Patient identified, Emergency Drugs available, Suction available and Patient being monitored Patient Re-evaluated:Patient Re-evaluated prior to induction Oxygen Delivery Method: Circle System Utilized Preoxygenation: Pre-oxygenation with 100% oxygen Induction Type: IV induction Ventilation: Mask ventilation without difficulty Laryngoscope Size: Mac and 3 Grade View: Grade I Tube type: Oral Tube size: 7.0 mm Number of attempts: 1 Airway Equipment and Method: Stylet and Oral airway Placement Confirmation: ETT inserted through vocal cords under direct vision, positive ETCO2 and breath sounds checked- equal and bilateral Secured at: 23 cm Tube secured with: Tape Dental Injury: Teeth and Oropharynx as per pre-operative assessment

## 2023-04-04 NOTE — Progress Notes (Signed)
OT Cancellation Note  Patient Details Name: Gavin Dorsey MRN: [id number] DOB: December 23, 1960   Cancelled Treatment:    Reason Eval/Treat Not Completed: Patient at procedure or test/ unavailable (basilar artery angioplasty; OT to f/u post bed rest)  Donia Pounds 04/04/2023, 10:19 AM

## 2023-04-04 NOTE — Anesthesia Procedure Notes (Signed)
Arterial Line Insertion Start/End12/20/2024 10:24 AM, 04/04/2023 10:24 AM Performed by: CRNA  Patient location: Pre-op. Preanesthetic checklist: patient identified, IV checked, site marked, risks and benefits discussed, surgical consent, monitors and equipment checked, pre-op evaluation, timeout performed and anesthesia consent Lidocaine 1% used for infiltration Left, radial was placed Catheter size: 20 G Hand hygiene performed  and maximum sterile barriers used   Attempts: 1 Procedure performed without using ultrasound guided technique. Following insertion, Biopatch and dressing applied. Post procedure assessment: normal  Patient tolerated the procedure well with no immediate complications.

## 2023-04-04 NOTE — Progress Notes (Signed)
PHARMACY - ANTICOAGULATION CONSULT NOTE  Pharmacy Consult for Heparin Indication:  post neuro revascularization  No Known Allergies  Patient Measurements: Weight: 100.9 kg (222 lb 7.1 oz) Heparin Dosing Weight: 95 kg  Vital Signs: Temp: 97.6 F (36.4 C) (12/20 1800) Temp Source: Axillary (12/20 0800) BP: 108/63 (12/20 1800) Pulse Rate: 75 (12/20 1800)  Labs: Recent Labs    04/02/23 0552  HGB 16.8  HCT 50.1  PLT 229  CREATININE 1.32*    Estimated Creatinine Clearance: 66.8 mL/min (A) (by C-G formula based on SCr of 1.32 mg/dL (H)).   Medical History: Past Medical History:  Diagnosis Date   Diabetes mellitus without complication (HCC)    Hypertension     Medications:  Medications Prior to Admission  Medication Sig Dispense Refill Last Dose/Taking   AMBULATORY NON FORMULARY MEDICATION Testosterone Cream 20%  Apply 0.5cc daily  Quantity: 30ml Refill 5  [facility] Dr. Laurell Josephs F2 [phone] 30 mL 5 Past Week   diazepam (VALIUM) 5 MG tablet Take 5 mg by mouth 2 (two) times daily as needed for anxiety.   Past Week   tadalafil (CIALIS) 5 MG tablet Take 1 tablet (5 mg total) by mouth daily as needed for erectile dysfunction. 90 tablet 2 Past Month    Assessment: 62 y.o. M s/p neuro revascularization. To begin heparin low dose per pharmacy. Heparin will stop tomorrow at 0700. CBC ok on admission.  Goal of Therapy:  Heparin level 0.1-0.25 units/ml Monitor platelets by anticoagulation protocol: Yes   Plan:  D/c lovenox Increase heparin to 800 units/hr Will f/u 8 hour heparin level Heparin ends 12/21 0700  Christoper Fabian, PharmD, BCPS Please see amion for complete clinical pharmacist phone list 04/04/2023,6:14 PM

## 2023-04-05 ENCOUNTER — Other Ambulatory Visit: Payer: Self-pay | Admitting: Physician Assistant

## 2023-04-05 DIAGNOSIS — I6322 Cerebral infarction due to unspecified occlusion or stenosis of basilar arteries: Secondary | ICD-10-CM

## 2023-04-05 LAB — CBC WITH DIFFERENTIAL/PLATELET
Abs Immature Granulocytes: 0.04 10*3/uL (ref 0.00–0.07)
Basophils Absolute: 0 10*3/uL (ref 0.0–0.1)
Basophils Relative: 0 %
Eosinophils Absolute: 0 10*3/uL (ref 0.0–0.5)
Eosinophils Relative: 0 %
HCT: 46.4 % (ref 39.0–52.0)
Hemoglobin: 15.5 g/dL (ref 13.0–17.0)
Immature Granulocytes: 0 %
Lymphocytes Relative: 8 %
Lymphs Abs: 0.7 10*3/uL (ref 0.7–4.0)
MCH: 28.4 pg (ref 26.0–34.0)
MCHC: 33.4 g/dL (ref 30.0–36.0)
MCV: 85 fL (ref 80.0–100.0)
Monocytes Absolute: 0.6 10*3/uL (ref 0.1–1.0)
Monocytes Relative: 7 %
Neutro Abs: 8 10*3/uL — ABNORMAL HIGH (ref 1.7–7.7)
Neutrophils Relative %: 85 %
Platelets: 218 10*3/uL (ref 150–400)
RBC: 5.46 MIL/uL (ref 4.22–5.81)
RDW: 16.3 % — ABNORMAL HIGH (ref 11.5–15.5)
WBC: 9.4 10*3/uL (ref 4.0–10.5)
nRBC: 0 % (ref 0.0–0.2)

## 2023-04-05 LAB — HEPARIN LEVEL (UNFRACTIONATED): Heparin Unfractionated: 0.11 [IU]/mL — ABNORMAL LOW (ref 0.30–0.70)

## 2023-04-05 LAB — BASIC METABOLIC PANEL
Anion gap: 7 (ref 5–15)
BUN: 16 mg/dL (ref 8–23)
CO2: 26 mmol/L (ref 22–32)
Calcium: 8.3 mg/dL — ABNORMAL LOW (ref 8.9–10.3)
Chloride: 102 mmol/L (ref 98–111)
Creatinine, Ser: 1.61 mg/dL — ABNORMAL HIGH (ref 0.61–1.24)
GFR, Estimated: 48 mL/min — ABNORMAL LOW (ref >60)
Glucose, Bld: 178 mg/dL — ABNORMAL HIGH (ref 70–99)
Potassium: 4 mmol/L (ref 3.5–5.1)
Sodium: 135 mmol/L (ref 135–145)

## 2023-04-05 MED ORDER — ACETAMINOPHEN 160 MG/5ML PO SOLN: 650.0000 mg | ORAL | Status: DC | PRN

## 2023-04-05 MED ORDER — ASPIRIN 81 MG PO CHEW: 81.0000 mg | CHEWABLE_TABLET | Freq: Every day | ORAL | Status: DC

## 2023-04-05 MED ORDER — SODIUM CHLORIDE 0.9 % IV SOLN
12.5000 mg | Freq: Once | INTRAVENOUS | Status: AC
  Administered 2023-04-05: 12.5 mg via INTRAVENOUS
  Filled 2023-04-05 (×2): qty 0.5

## 2023-04-05 MED ORDER — ONDANSETRON HCL 4 MG/2ML IJ SOLN
4.0000 mg | Freq: Four times a day (QID) | INTRAMUSCULAR | Status: DC | PRN
  Administered 2023-04-05: 4 mg via INTRAVENOUS
  Filled 2023-04-05: qty 2

## 2023-04-05 MED ORDER — SODIUM CHLORIDE 0.9 % IV SOLN: INTRAVENOUS | Status: DC

## 2023-04-05 MED ORDER — LABETALOL HCL 5 MG/ML IV SOLN
0.5000 mg/min | Status: DC
  Administered 2023-04-05: 0.5 mg/min via INTRAVENOUS
  Filled 2023-04-05 (×2): qty 80

## 2023-04-05 MED ORDER — ACETAMINOPHEN 650 MG RE SUPP: 650.0000 mg | RECTAL | Status: DC | PRN

## 2023-04-05 MED ORDER — CLEVIDIPINE BUTYRATE 0.5 MG/ML IV EMUL
0.0000 mg/h | INTRAVENOUS | Status: DC
  Administered 2023-04-05: 20 mg/h via INTRAVENOUS
  Administered 2023-04-05: 12 mg/h via INTRAVENOUS
  Administered 2023-04-06: 20 mg/h via INTRAVENOUS
  Administered 2023-04-06: 18 mg/h via INTRAVENOUS
  Filled 2023-04-05 (×3): qty 100
  Filled 2023-04-05: qty 50

## 2023-04-05 MED ORDER — SODIUM CHLORIDE 0.9% FLUSH
3.0000 mL | Freq: Two times a day (BID) | INTRAVENOUS | Status: DC
  Administered 2023-04-05 (×2): 10 mL via INTRAVENOUS

## 2023-04-05 MED ORDER — ACETAMINOPHEN 325 MG PO TABS: 650.0000 mg | ORAL_TABLET | ORAL | Status: DC | PRN

## 2023-04-05 MED ORDER — SODIUM CHLORIDE 0.9% FLUSH: 3.0000 mL | INTRAVENOUS | Status: DC | PRN

## 2023-04-05 MED ORDER — ALUM & MAG HYDROXIDE-SIMETH 200-200-20 MG/5ML PO SUSP
30.0000 mL | ORAL | Status: DC | PRN
  Administered 2023-04-05 (×2): 30 mL via ORAL
  Filled 2023-04-05 (×2): qty 30

## 2023-04-05 NOTE — Progress Notes (Signed)
As of 1715, patient is now post TR band and SBP goals can be <180, and cleviprex is being titrated appropriately. See orders tab for details.  Mammie Russian, RN

## 2023-04-05 NOTE — Progress Notes (Signed)
Conflicting SBP orders.  Contacted stroke MD to clarify SBP goals.  Per Dr. Derry Lory, SBP goal 120-140 for first 24 hrs.  Notified MD pt was maxed on Cleviprex gtt at 32 mg/hr.  New order for labetalol gtt placed by MD.  Pt's HR 89.    Pt's SBP remains > 140 while waiting for labetalol gtt from pharmacy.  Will start gtt as soon as it arrives.

## 2023-04-05 NOTE — Progress Notes (Signed)
PHARMACY - ANTICOAGULATION CONSULT NOTE  Pharmacy Consult for heparin Indication:  s/p IR neuro revascularization  Labs: Recent Labs    04/02/23 0552 04/05/23 0408  HGB 16.8 15.5  HCT 50.1 46.4  PLT 229 218  HEPARINUNFRC  --  0.11*  CREATININE 1.32* 1.61*  Assessment/Plan:  62yo male therapeutic on heparin with initial dosing s/p IR neuro revascularization. Will continue infusion at current rate of 800 units/hr and until off in am.  Vernard Gambles, PharmD, BCPS 04/05/2023 4:47 AM

## 2023-04-05 NOTE — Progress Notes (Addendum)
STROKE TEAM PROGRESS NOTE   BRIEF HPI [name] is a 62 y.o. male with history of DM, HTN presenting with 2 weeks of left ear pain, tinnitus, and pain behind the left ear. Loaded with ASA 325mg  and Plavix 300mg  in ED.   NIH on Admission 1  SIGNIFICANT HOSPITAL EVENTS 12/17- admit to ICU 12/18- IR for cerebral angiogram 12/19- load with Brilinta 12/20-basilar artery stenting performed  INTERIM HISTORY/SUBJECTIVE Patient underwent successful basilar artery stenting procedure yesterday.  However, he has required clevidipine and labetalol to maintain blood pressure within parameters overnight.  Will hopefully be able to wean these off tonight and discharge patient tomorrow.  OBJECTIVE  CBC    Component Value Date/Time   WBC 9.4 04/05/2023 0408   RBC 5.46 04/05/2023 0408   HGB 15.5 04/05/2023 0408   HGB 15.9 07/27/2011 1426   HCT 46.4 04/05/2023 0408   HCT 54.3 (H) 03/06/2023 1449   PLT 218 04/05/2023 0408   PLT 240 07/27/2011 1426   MCV 85.0 04/05/2023 0408   MCV 88 07/27/2011 1426   MCH 28.4 04/05/2023 0408   MCHC 33.4 04/05/2023 0408   RDW 16.3 (H) 04/05/2023 0408   RDW 13.5 07/27/2011 1426   LYMPHSABS 0.7 04/05/2023 0408   MONOABS 0.6 04/05/2023 0408   EOSABS 0.0 04/05/2023 0408   BASOSABS 0.0 04/05/2023 0408    BMET    Component Value Date/Time   NA 135 04/05/2023 0408   NA 139 07/27/2011 1426   K 4.0 04/05/2023 0408   K 3.6 07/27/2011 1426   CL 102 04/05/2023 0408   CL 104 07/27/2011 1426   CO2 26 04/05/2023 0408   CO2 27 07/27/2011 1426   GLUCOSE 178 (H) 04/05/2023 0408   GLUCOSE 123 (H) 07/27/2011 1426   BUN 16 04/05/2023 0408   BUN 17 07/27/2011 1426   CREATININE 1.61 (H) 04/05/2023 0408   CREATININE 1.27 07/27/2011 1426   CALCIUM 8.3 (L) 04/05/2023 0408   CALCIUM 8.3 (L) 07/27/2011 1426   GFRNONAA 48 (L) 04/05/2023 0408   GFRNONAA >60 07/27/2011 1426    IMAGING past 24 hours No results found.   Vitals:   04/05/23 0645 04/05/23 0700  04/05/23 0800 04/05/23 1200  BP:      Pulse: 75 74 74   Resp: (!) 23 18 (!) 25   Temp:   98.5 F (36.9 C) 98.4 F (36.9 C)  TempSrc:   Oral Oral  SpO2: 92% 97% 92%   Weight:         PHYSICAL EXAM General:  Alert, mildly obese middle-age Caucasian male in no acute distress Psych:  Mood and affect appropriate for situation CV: Regular rate and rhythm on monitor Respiratory:  Regular, unlabored respirations on room air GI: Abdomen soft and nontender   NEURO:  Mental Status: AA&Ox3, patient is able to give clear and coherent history Speech/Language: speech is without dysarthria or aphasia.    Cranial Nerves:  II: PERRL.  III, IV, VI: EOMI. Eyelids elevate symmetrically.  Saccadic dysmetria on lateral gaze bilaterally.  No nystagmus V: Sensation is intact to light touch and symmetrical to face.  VII: Face is symmetrical resting and smiling VIII: hearing intact to voice. IX, X: Phonation is normal.  XII: tongue is midline without fasciculations. Motor: 5/5 strength to all muscle groups tested.  Tone: is normal and bulk is normal Sensation- Intact to light touch bilaterally.  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 Poor opacification of the remainder of the posterior circulation, with fairly robust opacification the proximal right V4 and P1 segment, but poor opacification of the distal right V4, basilar, and more distal PCAs. Moderate stenosis in the proximal right supraclinoid ICA and mild stenosis in the proximal left supraclinoid ICA. MRI  Scattered foci of acute and  subacute infarction throughout the brainstem and left cerebellar hemisphere. Loss of the distal vertebral and basilar artery flow voids, suspicious for occlusion.  2D Echo 55-60% LDL 101 HgbA1c 5.2 VTE prophylaxis - Lovenox aspirin 81 mg daily prior to admission, now on brilinta 90mg  BID and ASA 81mg  Stop plavix load with brilinta, IR stent assisted angioplasty of basilar artery formed 12/20 Therapy recommendations:  Outpatient PT/OT/ST Disposition:  Pending   Left vertebral artery stenosis Cerebral angiogram 12/18 with Dr. Corliss Skains  Near occlusion of the  right vertebral basilar junction just distal to the right posterior inferior cerebellar  artery with a string sign opacification of the proximal one third to half  of the basilar artery.  Distal one third of the basilar artery opacifies  retrogradely via a severely diseased left posterior communicating artery.   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 loaded with brilinta 12/19 Stent assisted angioplasty of basilar artery performed 12/20  Hypertension Home meds:  None Unstable, currently requiring Cleviprex and labetalol SBP 120-140 for first 24 hours then less than 180  Hyperlipidemia Home meds:  None, resumed in hospital LDL 101, goal < 70 Add Crestor 40mg   Continue statin at discharge  Diabetes type II Controlled Home meds:  None HgbA1c 5.2, goal < 7.0 CBGs SSI Recommend close follow-up with PCP for better DM control  Dysphagia Patient has post-stroke dysphagia, SLP consulted    Diet   Diet Heart Room service appropriate? Yes; Fluid consistency: Thin   Advance diet as tolerated  Other Stroke Risk Factors ETOH use, alcohol level <10, advised to drink no more than 2 drink(s) a day Obesity, Body mass index is 33.82 kg/m., BMI >/= 30 associated with increased stroke risk, recommend weight loss, diet and exercise as appropriate   Hospital day # 3  Patient seen and examined by  NP/APP with MD. MD to update note as needed.   Cortney E Ernestina Columbia , MSN, AGACNP-BC [facility] See Amion for schedule and pager information 04/05/2023 1:03 PM   I have personally obtained history,examined this patient, reviewed notes, independently viewed imaging studies, participated in medical decision making and plan of care.ROS completed by me personally and pertinent positives fully documented  I have made any additions or clarifications directly to the above note. Agree with note above.  Patient had elective basilar angioplasty stenting yesterday uneventfully and is doing well.  Continue strict blood pressure control and close neurological monitoring as per post angioplasty protocol.  Mobilize out of bed.  Therapy consults.  Continue aspirin and Brilinta and hopefully discharge tomorrow.  No family at the bedside.  Discussed with Dr. Corliss Skains.This patient is critically ill and at significant risk of neurological worsening, death and care requires constant monitoring of vital signs, hemodynamics,respiratory and cardiac monitoring, extensive review of multiple  databases, frequent neurological assessment, discussion with family, other specialists and medical decision making of high complexity.I have made any additions or clarifications directly to the above note.This critical care time does not reflect procedure time, or teaching time or supervisory time of PA/NP/Med Resident etc but could involve care discussion time.  I spent 30 minutes of neurocritical care time  in the care of  this patient.      Delia Heady, MD Medical Director [facility] Pager: [phone] 04/05/2023 3:07 PM  To contact Stroke Continuity provider, please refer to [email or web address]. After hours, contact General Neurology

## 2023-04-05 NOTE — Progress Notes (Signed)
Referring Provider(s): Floydene Flock  Supervising Physician: Julieanne Cotton  Patient Status:  [facility] - In-pt  Chief Complaint:  Left vertebral artery occlusion; basilar artery stenosis.   Brief History:  Gavin Dorsey is a 62 y.o. male with a history of DM and HTN.   He presented to the ED with complaints of left ear pain, pain behind the left ear and tinnitus. This had been ongoing for two weeks and he then developed trouble with walking and balance.   In the ED he was found to have scattered brainstem and cerebellar infarcts as well as left vertebral occlusion. He was seen in IR 04/02/23 for a diagnostic angiogram which showed: Near occlusion of the  right vertebral basilar junction just distal to the right posterior inferior cerebellar  artery with a string sign opacification of the proximal one third to half  of the basilar artery.  Distal one third of the basilar artery opacifies  retrogradely via a severely diseased left posterior communicating artery.   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   He underwent revascularization of occluded proximal to mid basilar artery with stent assisted angioplasty achieving a TICI  3 revascularization.   Subjective:  Doing better - still c/o some fullness in his left ear.  Allergies: Patient has no known allergies.  Medications: Prior to Admission medications   Medication Sig Start Date End Date Taking? Authorizing Provider  AMBULATORY NON FORMULARY MEDICATION Testosterone Cream 20%  Apply 0.5cc daily  Quantity: 30ml Refill 5  [facility] [address]. Jerlyn Ly [phone] 08/19/22  Yes Stoioff, Verna Czech, MD  diazepam (VALIUM) 5 MG tablet Take 5 mg by mouth 2 (two) times daily as needed for anxiety.   Yes [provider]  tadalafil (CIALIS) 5 MG tablet Take 1 tablet (5 mg total) by mouth daily as needed for erectile dysfunction.  01/02/23  Yes Stoioff, Verna Czech, MD     Vital Signs: BP 133/66   Pulse 74   Temp 98.4 F (36.9 C) (Oral)   Resp (!) 25   Wt 222 lb 7.1 oz (100.9 kg)   SpO2 92%   BMI 33.82 kg/m   Physical Exam Alert, awake, and oriented x3. Speech and comprehension intact. PERRL bilaterally. EOMs intact bilaterally without nystagmus or subjective diplopia. Visual fields intact bilaterally. No facial asymmetry. Tongue midline. Zig Zag with finger to nose 5/5 Strength bilaterally Radial artery puncture site looks good, no bleeding, no hematoma, no pseudoaneurysm   Labs:  CBC: Recent Labs    11/04/22 1141 03/06/23 1449 04/01/23 0828 04/02/23 0552 04/05/23 0408  WBC 10.3  --  7.7 8.0 9.4  HGB 15.6  --  18.0* 16.8 15.5  HCT 50.9 54.3* 55.3* 50.1 46.4  PLT 326  --  233 229 218    COAGS: Recent Labs    04/01/23 0828  INR 1.0  APTT 31    BMP: Recent Labs    10/31/22 0805 11/04/22 1141 04/01/23 0828 04/02/23 0552 04/05/23 0408  NA 134* 137 137  --  135  K 4.1 4.2 3.7  --  4.0  CL 101 101 101  --  102  CO2 25 29 26   --  26  GLUCOSE 115* 109* 119*  --  178*  BUN 19 16 19   --  16  CALCIUM 8.4* 8.8* 9.0  --  8.3*  CREATININE 1.23 1.27* 1.33* 1.32* 1.61*  GFRNONAA >60 >60 >60 >60 48*  LIVER FUNCTION TESTS: Recent Labs    04/01/23 0828  BILITOT 0.8  AST 17  ALT 16  ALKPHOS 70  PROT 7.4  ALBUMIN 3.7    Assessment and Plan:  Left vertebral artery occlusion; basilar artery stenosis.   S/P revascularization of occluded proximal to mid basilar artery with stent assisted angioplasty achieving a TICI  3 revascularization by Dr. Corliss Skains.  Continue Aspirin and Brilinta.  I will order 2 week follow up. IR Schedulers will call patient with details.  Electronically Signed: Gwynneth Macleod, PA-C 04/05/2023, 1:12 PM    I spent a total of 15 Minutes at the the patient's bedside AND on the patient's hospital floor or unit, greater than 50% of which was  counseling/coordinating care for cerebral intervention.

## 2023-04-06 ENCOUNTER — Encounter (HOSPITAL_COMMUNITY): Payer: Self-pay | Admitting: Interventional Radiology

## 2023-04-06 LAB — TRIGLYCERIDES: Triglycerides: 96 mg/dL (ref <150)

## 2023-04-06 MED ORDER — TICAGRELOR 90 MG PO TABS: 90.0000 mg | ORAL_TABLET | Freq: Two times a day (BID) | ORAL | 1 refills | Status: DC

## 2023-04-06 MED ORDER — ASPIRIN 81 MG PO CHEW: 81.0000 mg | CHEWABLE_TABLET | Freq: Every day | ORAL | 2 refills | Status: DC

## 2023-04-06 MED ORDER — HYDRALAZINE HCL 20 MG/ML IJ SOLN: 10.0000 mg | INTRAMUSCULAR | Status: DC | PRN

## 2023-04-06 MED ORDER — ROSUVASTATIN CALCIUM 40 MG PO TABS: 40.0000 mg | ORAL_TABLET | Freq: Every day | ORAL | 2 refills | Status: DC

## 2023-04-06 MED ORDER — ASPIRIN 81 MG PO CHEW: 81.0000 mg | CHEWABLE_TABLET | Freq: Every day | ORAL | 1 refills | Status: DC

## 2023-04-06 MED ORDER — LABETALOL HCL 5 MG/ML IV SOLN: 10.0000 mg | INTRAVENOUS | Status: DC | PRN

## 2023-04-06 MED ORDER — ROSUVASTATIN CALCIUM 40 MG PO TABS: 40.0000 mg | ORAL_TABLET | Freq: Every day | ORAL | 1 refills | Status: DC

## 2023-04-06 MED ORDER — TICAGRELOR 90 MG PO TABS: 90.0000 mg | ORAL_TABLET | Freq: Two times a day (BID) | ORAL | 2 refills | Status: DC

## 2023-04-06 NOTE — Progress Notes (Signed)
Physical Therapy Treatment Patient Details Name: Gavin Dorsey MRN: [id number] DOB: 07-07-1960 Today's Date: 04/06/2023   History of Present Illness Gavin Dorsey is a 62 yo male who presented with 2 week hx of L ear pain, dysequilibrium, and feeling off balance. MRI Brain and CTA with scattered brainstem and left cerebellar infarcts.  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 S/p revascularization 12/18, stents placed 12/20. PMHx: DM II, HTN    PT Comments  The pt was agreeable to session, demos improvement in ambulation distance and was able to complete stair training with good stability with use of single rail. However, the pt continues to demo instability with addition of balance challenge, especially dynamic SLS, head turns, and navigating around obstacles. Pt reports he will have supervision from friends and transportation from the friends he plans to d/c home with, will benefit from continued skilled PT to address dynamic balance, coordination, and safety awareness after d/c.     If plan is discharge home, recommend the following: A little help with walking and/or transfers;A little help with bathing/dressing/bathroom;Assist for transportation;Help with stairs or ramp for entrance   Can travel by private vehicle        Equipment Recommendations  Rolling walker (2 wheels)    Recommendations for Other Services       Precautions / Restrictions Precautions Precautions: Fall Restrictions Weight Bearing Restrictions Per Provider Order: No     Mobility  Bed Mobility               General bed mobility comments: OOB in chair upon arrival    Transfers Overall transfer level: Modified independent Equipment used: None, Rolling walker (2 wheels) Transfers: Sit to/from Stand Sit to Stand: Contact guard assist           General transfer comment: better stability with RW, able to complete multiple times in session with both RW and no DME     Ambulation/Gait Ambulation/Gait assistance: Contact guard assist, Min assist Gait Distance (Feet): 300 Feet Assistive device: Rolling walker (2 wheels), None Gait Pattern/deviations: Decreased stride length, Step-through pattern Gait velocity: DECREASED Gait velocity interpretation: <1.31 ft/sec, indicative of household ambulator   General Gait Details: improved stability with RW, still runnning into a few objects on L with RW   Stairs Stairs: Yes Stairs assistance: Contact guard assist Stair Management: One rail Right, Step to pattern, Forwards Number of Stairs: 5 General stair comments: VSS, pt stable with at least single UE support   Wheelchair Mobility     Tilt Bed    Modified Rankin (Stroke Patients Only) Modified Rankin (Stroke Patients Only) Pre-Morbid Rankin Score: No symptoms Modified Rankin: Moderately severe disability     Balance Overall balance assessment: Needs assistance Sitting-balance support: Feet supported Sitting balance-Leahy Scale: Good     Standing balance support: No upper extremity supported, During functional activity Standing balance-Leahy Scale: Fair Standing balance comment: improved with BUE support Single Leg Stance - Right Leg: 0 Single Leg Stance - Left Leg: 0     Rhomberg - Eyes Opened: 15 Rhomberg - Eyes Closed: 3 (increased sway) High level balance activites: Turns, Sudden stops, Head turns High Level Balance Comments: increased assist with challenge, improved stability with BUE support            Cognition Arousal: Alert Behavior During Therapy: WFL for tasks assessed/performed Overall Cognitive Status: Within Functional Limits for tasks assessed  Exercises Other Exercises Other Exercises: standing marches and toe taps Other Exercises: walking around obstacles in hallway with good clearance    General Comments General comments (skin integrity, edema,  etc.): VSS on RA      Pertinent Vitals/Pain Pain Assessment Pain Assessment: Faces (Simultaneous filing. User may not have seen previous data.) Faces Pain Scale: No hurt Pain Intervention(s): Limited activity within patient's tolerance, Monitored during session    Home Living     Available Help at Discharge: Friend(s);Available PRN/intermittently Type of Home: Apartment                      PT Goals (current goals can now be found in the care plan section) Acute Rehab PT Goals Patient Stated Goal: to go home PT Goal Formulation: Patient unable to participate in goal setting Time For Goal Achievement: 04/17/23 Potential to Achieve Goals: Good Progress towards PT goals: Progressing toward goals    Frequency    Min 1X/week       AM-PAC PT "6 Clicks" Mobility   Outcome Measure  Help needed turning from your back to your side while in a flat bed without using bedrails?: A Little Help needed moving from lying on your back to sitting on the side of a flat bed without using bedrails?: None Help needed moving to and from a bed to a chair (including a wheelchair)?: None Help needed standing up from a chair using your arms (e.g., wheelchair or bedside chair)?: A Little Help needed to walk in hospital room?: A Little Help needed climbing 3-5 steps with a railing? : A Little 6 Click Score: 20    End of Session Equipment Utilized During Treatment: Gait belt       PT Visit Diagnosis: Unsteadiness on feet (R26.81)     Time: 4401-0272 PT Time Calculation (min) (ACUTE ONLY): 16 min  Charges:    $Neuromuscular Re-education: 8-22 mins PT General Charges $$ ACUTE PT VISIT: 1 Visit                     Vickki Muff, PT, DPT   Acute Rehabilitation Department Office [phone] Secure Chat Communication Preferred   Ronnie Derby 04/06/2023, 12:07 PM

## 2023-04-06 NOTE — Discharge Summary (Addendum)
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 Jerlyn Ly [phone]   aspirin 81 MG chewable tablet Chew 1 tablet (81 mg total) by mouth daily. Start taking on: April 07, 2023   diazepam 5 MG tablet Commonly known as: VALIUM Take 5 mg by mouth 2 (two) times daily as needed for anxiety.   rosuvastatin 40 MG tablet Commonly known as: CRESTOR Take 1 tablet (40 mg total) by mouth daily. Start taking on: April 07, 2023   tadalafil 5 MG tablet Commonly known as: CIALIS Take 1 tablet (5 mg total) by mouth daily as needed for erectile dysfunction.   ticagrelor 90 MG Tabs tablet Commonly known as: BRILINTA Take 1 tablet (90 mg total) by mouth 2 (two) times daily.        LABORATORY STUDIES CBC    Component Value Date/Time   WBC 9.4  04/05/2023 0408   RBC 5.46 04/05/2023 0408   HGB 15.5 04/05/2023 0408   HGB 15.9 07/27/2011 1426   HCT 46.4 04/05/2023 0408   HCT 54.3 (H) 03/06/2023 1449   PLT 218 04/05/2023 0408   PLT 240 07/27/2011 1426   MCV 85.0 04/05/2023 0408   MCV 88 07/27/2011 1426   MCH 28.4 04/05/2023 0408   MCHC 33.4 04/05/2023 0408   RDW 16.3 (H) 04/05/2023 0408   RDW 13.5 07/27/2011 1426   LYMPHSABS 0.7 04/05/2023 0408   MONOABS 0.6 04/05/2023 0408   EOSABS 0.0 04/05/2023 0408   BASOSABS 0.0 04/05/2023 0408   CMP    Component Value Date/Time   NA 135 04/05/2023 0408   NA 139 07/27/2011 1426   K 4.0 04/05/2023 0408   K 3.6 07/27/2011 1426   CL 102 04/05/2023 0408   CL 104 07/27/2011 1426   CO2 26 04/05/2023 0408   CO2 27 07/27/2011 1426   GLUCOSE 178 (H) 04/05/2023 0408   GLUCOSE 123 (H) 07/27/2011 1426   BUN 16 04/05/2023 0408   BUN 17 07/27/2011 1426   CREATININE 1.61 (H) 04/05/2023 0408   CREATININE 1.27 07/27/2011 1426   CALCIUM 8.3 (L) 04/05/2023 0408   CALCIUM 8.3 (L) 07/27/2011 1426   PROT 7.4 04/01/2023 0828   PROT 7.7 07/27/2011 1426   ALBUMIN 3.7 04/01/2023 0828   ALBUMIN 3.4 07/27/2011 1426   AST 17 04/01/2023 0828   AST 26 07/27/2011 1426  ALT 16 04/01/2023 0828   ALT 31 07/27/2011 1426   ALKPHOS 70 04/01/2023 0828   ALKPHOS 83 07/27/2011 1426   BILITOT 0.8 04/01/2023 0828   BILITOT 0.6 07/27/2011 1426   GFRNONAA 48 (L) 04/05/2023 0408   GFRNONAA >60 07/27/2011 1426   GFRAA >60 07/19/2016 0220   GFRAA >60 07/27/2011 1426   COAGS Lab Results  Component Value Date   INR 1.0 04/01/2023   Lipid Panel    Component Value Date/Time   CHOL 148 04/02/2023 0552   TRIG 96 04/06/2023 0437   HDL 27 (L) 04/02/2023 0552   CHOLHDL 5.5 04/02/2023 0552   VLDL 20 04/02/2023 0552   LDLCALC 101 (H) 04/02/2023 0552   HgbA1C  Lab Results  Component Value Date   HGBA1C 5.2 04/01/2023   Alcohol Level    Component Value Date/Time   ETH <10 04/01/2023 0828      SIGNIFICANT DIAGNOSTIC STUDIES IR US Guide Vasc Access Right Result Date: 04/04/2023 CLINICAL DATA:  Vertebrobasilar ischemic symptoms of gait instability, lack of balance, and ischemic strokes involving the left cerebellar hemisphere. EXAM: IR ANGIO VERTEBRAL SEL VERTEBRAL UNI RIGHT MOD SED COMPARISON:  CT angiogram of the head and neck of December 17th and MRI of the brain of December 17th, 2024. MEDICATIONS: Heparin 2000 units IV. None antibiotic was administered within 1 hour of the procedure. ANESTHESIA/SEDATION: Versed 1 mg IV; Fentanyl 25 mcg IV Moderate Sedation Time:  28 minutes The patient was continuously monitored during the procedure by the interventional radiology nurse under my direct supervision. CONTRAST:  Omnipaque 300 approximately 65 mL. FLUOROSCOPY TIME:  Fluoroscopy Time: 8 minutes 54 seconds (777 mGy). COMPLICATIONS: None immediate. TECHNIQUE: Informed written consent was obtained from the patient after a thorough discussion of the procedural risks, benefits and alternatives. All questions were addressed. Maximal Sterile Barrier Technique was utilized including caps, mask, sterile gowns, sterile gloves, sterile drape, hand hygiene and skin antiseptic. A timeout was performed prior to the initiation of the procedure. The right forearm to the wrist was prepped and draped in the usual sterile manner. The right radial artery was identified, and its morphology documented in the radiology PACS system. A dorsal palmar anastomosis was verified to be present. Using ultrasound guidance, a 4/5 French sheath was then placed over an 018 inch micro guidewire. The obturator, and the micro guidewire were removed. Good aspiration was obtained from the side port of the radial sheath. A cocktail of 2000 units of heparin, 200 mcg of nitroglycerin, and 2.5 mg verapamil was infused in diluted form without event. A right radial arteriogram was performed. Over an 035 inch Roadrunner guidewire, a 5 French  Simmons 2 diagnostic catheter was advanced to the aortic arch region, and selectively positioned in the right vertebral artery, the right common carotid artery and the left common carotid artery. A wrist band was applied for hemostasis at the right radial puncture site. Distal right pulse was verified to be present. FINDINGS: The dominant right vertebral artery origin is widely patent. The vessel is seen to opacify to the cranial skull base. Patency is seen of the right posterior-inferior cerebellar artery and the right vertebrobasilar junction. More distally, there is a severe pre occlusive stenosis of the proximal basilar artery proximal to the origins of the anterior-inferior cerebellar arteries. The left subclavian arteriogram demonstrates occluded left vertebral artery at its origin without distal reconstitution from the thyrocervical trunk branches. The left common carotid arteriogram demonstrates the left external carotid artery and its major branches  to be widely patent. The left internal carotid artery at the bulb to the cranial skull base demonstrates wide patency with mild tortuosity involving the distal 1/3 of the cervical left ICA. The petrous, the cavernous and the supraclinoid left ICA are widely patent. The left middle cerebral artery and left anterior cerebral artery opacify into the capillary and venous phases. Prompt cross-filling via the anterior communicating artery of the right anterior cerebral artery A2 segment is evident. The delayed arterial run demonstrates a diminutive left posterior communicating artery which retrogradely opacifies a small caliber distal basilar artery. The right common carotid arteriogram reveals the right external carotid artery and its major branches to be widely patent. The right internal carotid artery at the bulb to the cranial skull base is widely patent with moderate tortuosity involving its mid 1/3. The petrous, the cavernous and the supraclinoid right ICA are  widely patent with mild stenosis involving the supraclinoid ICA. The right middle cerebral artery and the right anterior cerebral artery opacify into the capillary and venous phases. 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 Distal 1/3 of the basilar artery filling retrogradely via a diminutive left posterior communicating artery. 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 PLAN: Findings reviewed with patient and the patient's neurologist. Electronically Signed   By: Julieanne Cotton M.D.   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 EXAM: IR ANGIO VERTEBRAL SEL VERTEBRAL UNI RIGHT MOD SED COMPARISON:  CT angiogram of the head and neck of December 17th and MRI of the brain of December 17th, 2024. MEDICATIONS: Heparin 2000 units IV. None antibiotic was administered within 1 hour of the procedure. ANESTHESIA/SEDATION: Versed 1 mg IV; Fentanyl 25 mcg IV Moderate Sedation Time:  28 minutes The patient was continuously monitored during the procedure by the interventional radiology nurse under my direct supervision. CONTRAST:  Omnipaque 300 approximately 65 mL. FLUOROSCOPY TIME:  Fluoroscopy Time: 8 minutes 54 seconds (777 mGy). COMPLICATIONS: None immediate. TECHNIQUE: Informed written consent was obtained from the patient after a thorough discussion of the procedural risks, benefits and alternatives. All questions were addressed. Maximal Sterile Barrier Technique was utilized including caps, mask, sterile gowns, sterile gloves, sterile drape, hand hygiene and skin antiseptic. A timeout was performed prior to the initiation of the procedure. The right forearm to the wrist was prepped and draped in the usual sterile manner. The right  radial artery was identified, and its morphology documented in the radiology PACS system. A dorsal palmar anastomosis was verified to be present. Using ultrasound guidance, a 4/5 French sheath was then placed over an 018 inch micro guidewire. The obturator, and the micro guidewire were removed. Good aspiration was obtained from the side port of the radial sheath. A cocktail of 2000 units of heparin, 200 mcg of nitroglycerin, and 2.5 mg verapamil was infused in diluted form without event. A right radial arteriogram was performed. Over an 035 inch Roadrunner guidewire, a 5 French Simmons 2 diagnostic catheter was advanced to the aortic arch region, and selectively positioned in the right vertebral artery, the right common carotid artery and the left common carotid artery. A wrist band was applied for hemostasis at the right radial puncture site. Distal right pulse was verified to be present. FINDINGS: The dominant right vertebral artery origin is widely patent. The vessel  is seen to opacify to the cranial skull base. Patency is seen of the right posterior-inferior cerebellar artery and the right vertebrobasilar junction. More distally, there is a severe pre occlusive stenosis of the proximal basilar artery proximal to the origins of the anterior-inferior cerebellar arteries. The left subclavian arteriogram demonstrates occluded left vertebral artery at its origin without distal reconstitution from the thyrocervical trunk branches. The left common carotid arteriogram demonstrates the left external carotid artery and its major branches to be widely patent. The left internal carotid artery at the bulb to the cranial skull base demonstrates wide patency with mild tortuosity involving the distal 1/3 of the cervical left ICA. The petrous, the cavernous and the supraclinoid left ICA are widely patent. The left middle cerebral artery and left anterior cerebral artery opacify into the capillary and venous phases. Prompt  cross-filling via the anterior communicating artery of the right anterior cerebral artery A2 segment is evident. The delayed arterial run demonstrates a diminutive left posterior communicating artery which retrogradely opacifies a small caliber distal basilar artery. The right common carotid arteriogram reveals the right external carotid artery and its major branches to be widely patent. The right internal carotid artery at the bulb to the cranial skull base is widely patent with moderate tortuosity involving its mid 1/3. The petrous, the cavernous and the supraclinoid right ICA are widely patent with mild stenosis involving the supraclinoid ICA. The right middle cerebral artery and the right anterior cerebral artery opacify into the capillary and venous phases. 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 Distal 1/3 of the basilar artery filling retrogradely via a diminutive left posterior communicating artery. 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 PLAN: Findings reviewed with patient and the patient's neurologist. Electronically Signed   By: Julieanne Cotton M.D.   On: 04/04/2023 07:53   IR ANGIO VERTEBRAL SEL VERTEBRAL UNI R MOD SED Result Date: 04/04/2023 CLINICAL DATA:  Vertebrobasilar ischemic symptoms of gait instability, lack of balance, and ischemic strokes involving the left cerebellar hemisphere. EXAM: IR ANGIO VERTEBRAL SEL VERTEBRAL UNI RIGHT MOD SED COMPARISON:  CT angiogram of the head and neck of December 17th and MRI of the brain of December 17th, 2024. MEDICATIONS: Heparin 2000 units IV. None antibiotic was administered within 1 hour of the procedure. ANESTHESIA/SEDATION: Versed 1 mg IV; Fentanyl 25 mcg IV Moderate Sedation Time:  28 minutes The patient was continuously monitored during the procedure by the interventional radiology nurse under my direct supervision. CONTRAST:  Omnipaque 300 approximately 65 mL. FLUOROSCOPY TIME:   Fluoroscopy Time: 8 minutes 54 seconds (777 mGy). COMPLICATIONS: None immediate. TECHNIQUE: Informed written consent was obtained from the patient after a thorough discussion of the procedural risks, benefits and alternatives. All questions were addressed. Maximal Sterile Barrier Technique was utilized including caps, mask, sterile gowns, sterile gloves, sterile drape, hand hygiene and skin antiseptic. A timeout was performed prior to the initiation of the procedure. The right forearm to the wrist was prepped and draped in the usual sterile manner. The right radial artery was identified, and its morphology documented in the radiology PACS system. A dorsal palmar anastomosis was verified to be present. Using ultrasound guidance, a 4/5 French sheath was then placed over an 018 inch micro guidewire. The obturator, and the micro guidewire were removed. Good aspiration was obtained from the side port of the radial sheath. A cocktail of 2000 units of heparin, 200 mcg of nitroglycerin, and 2.5 mg verapamil was infused in diluted form without  event. A right radial arteriogram was performed. Over an 035 inch Roadrunner guidewire, a 5 French Simmons 2 diagnostic catheter was advanced to the aortic arch region, and selectively positioned in the right vertebral artery, the right common carotid artery and the left common carotid artery. A wrist band was applied for hemostasis at the right radial puncture site. Distal right pulse was verified to be present. FINDINGS: The dominant right vertebral artery origin is widely patent. The vessel is seen to opacify to the cranial skull base. Patency is seen of the right posterior-inferior cerebellar artery and the right vertebrobasilar junction. More distally, there is a severe pre occlusive stenosis of the proximal basilar artery proximal to the origins of the anterior-inferior cerebellar arteries. The left subclavian arteriogram demonstrates occluded left vertebral artery at its origin  without distal reconstitution from the thyrocervical trunk branches. The left common carotid arteriogram demonstrates the left external carotid artery and its major branches to be widely patent. The left internal carotid artery at the bulb to the cranial skull base demonstrates wide patency with mild tortuosity involving the distal 1/3 of the cervical left ICA. The petrous, the cavernous and the supraclinoid left ICA are widely patent. The left middle cerebral artery and left anterior cerebral artery opacify into the capillary and venous phases. Prompt cross-filling via the anterior communicating artery of the right anterior cerebral artery A2 segment is evident. The delayed arterial run demonstrates a diminutive left posterior communicating artery which retrogradely opacifies a small caliber distal basilar artery. The right common carotid arteriogram reveals the right external carotid artery and its major branches to be widely patent. The right internal carotid artery at the bulb to the cranial skull base is widely patent with moderate tortuosity involving its mid 1/3. The petrous, the cavernous and the supraclinoid right ICA are widely patent with mild stenosis involving the supraclinoid ICA. The right middle cerebral artery and the right anterior cerebral artery opacify into the capillary and venous phases. 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 Distal 1/3 of the basilar artery filling retrogradely via a diminutive left posterior communicating artery. 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 PLAN: Findings reviewed with patient and the patient's neurologist. Electronically Signed   By: Julieanne Cotton M.D.   On: 04/04/2023 07:53   IR ANGIO VERTEBRAL SEL SUBCLAVIAN INNOMINATE UNI L MOD SED Result Date: 04/04/2023 CLINICAL DATA:  Vertebrobasilar ischemic symptoms of gait instability, lack of balance, and ischemic strokes involving the left  cerebellar hemisphere. EXAM: IR ANGIO VERTEBRAL SEL VERTEBRAL UNI RIGHT MOD SED COMPARISON:  CT angiogram of the head and neck of December 17th and MRI of the brain of December 17th, 2024. MEDICATIONS: Heparin 2000 units IV. None antibiotic was administered within 1 hour of the procedure. ANESTHESIA/SEDATION: Versed 1 mg IV; Fentanyl 25 mcg IV Moderate Sedation Time:  28 minutes The patient was continuously monitored during the procedure by the interventional radiology nurse under my direct supervision. CONTRAST:  Omnipaque 300 approximately 65 mL. FLUOROSCOPY TIME:  Fluoroscopy Time: 8 minutes 54 seconds (777 mGy). COMPLICATIONS: None immediate. TECHNIQUE: Informed written consent was obtained from the patient after a thorough discussion of the procedural risks, benefits and alternatives. All questions were addressed. Maximal Sterile Barrier Technique was utilized including caps, mask, sterile gowns, sterile gloves, sterile drape, hand hygiene and skin antiseptic. A timeout was performed prior to the initiation of the procedure. The right forearm to the wrist was prepped and draped in the usual sterile manner. The  right radial artery was identified, and its morphology documented in the radiology PACS system. A dorsal palmar anastomosis was verified to be present. Using ultrasound guidance, a 4/5 French sheath was then placed over an 018 inch micro guidewire. The obturator, and the micro guidewire were removed. Good aspiration was obtained from the side port of the radial sheath. A cocktail of 2000 units of heparin, 200 mcg of nitroglycerin, and 2.5 mg verapamil was infused in diluted form without event. A right radial arteriogram was performed. Over an 035 inch Roadrunner guidewire, a 5 French Simmons 2 diagnostic catheter was advanced to the aortic arch region, and selectively positioned in the right vertebral artery, the right common carotid artery and the left common carotid artery. A wrist band was applied for  hemostasis at the right radial puncture site. Distal right pulse was verified to be present. FINDINGS: The dominant right vertebral artery origin is widely patent. The vessel is seen to opacify to the cranial skull base. Patency is seen of the right posterior-inferior cerebellar artery and the right vertebrobasilar junction. More distally, there is a severe pre occlusive stenosis of the proximal basilar artery proximal to the origins of the anterior-inferior cerebellar arteries. The left subclavian arteriogram demonstrates occluded left vertebral artery at its origin without distal reconstitution from the thyrocervical trunk branches. The left common carotid arteriogram demonstrates the left external carotid artery and its major branches to be widely patent. The left internal carotid artery at the bulb to the cranial skull base demonstrates wide patency with mild tortuosity involving the distal 1/3 of the cervical left ICA. The petrous, the cavernous and the supraclinoid left ICA are widely patent. The left middle cerebral artery and left anterior cerebral artery opacify into the capillary and venous phases. Prompt cross-filling via the anterior communicating artery of the right anterior cerebral artery A2 segment is evident. The delayed arterial run demonstrates a diminutive left posterior communicating artery which retrogradely opacifies a small caliber distal basilar artery. The right common carotid arteriogram reveals the right external carotid artery and its major branches to be widely patent. The right internal carotid artery at the bulb to the cranial skull base is widely patent with moderate tortuosity involving its mid 1/3. The petrous, the cavernous and the supraclinoid right ICA are widely patent with mild stenosis involving the supraclinoid ICA. The right middle cerebral artery and the right anterior cerebral artery opacify into the capillary and venous phases. IMPRESSION: Severe pre occlusive stenosis  of the proximal basilar artery. Distal 1/3 of the basilar artery filling retrogradely via a diminutive left posterior communicating artery. 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 PLAN: Findings reviewed with patient and the patient's neurologist. Electronically Signed   By: Julieanne Cotton M.D.   On: 04/04/2023 07:53   ECHOCARDIOGRAM COMPLETE Result Date: 04/02/2023    ECHOCARDIOGRAM REPORT   Patient Name:   EDGAR ALLEGRO Date of Exam: 04/02/2023 Medical Rec #:  [id number]      Height:       68.0 in Accession #:    [id number]     Weight:       222.4 lb Date of Birth:  1961-03-11      BSA:          2.138 m Patient Age:    62 years       BP:           150/94 mmHg Patient Gender: M  HR:           69 bpm. Exam Location:  Inpatient Procedure: 2D Echo, Cardiac Doppler and Color Doppler Indications:    stroke  History:        Patient has no prior history of Echocardiogram examinations.                 Risk Factors:Diabetes and Hypertension.  Sonographer:    Delcie Roch RDCS Referring Phys: [id number] [facility] IMPRESSIONS  1. Left ventricular ejection fraction, by estimation, is 55 to 60%. The left ventricle has normal function. The left ventricle has no regional wall motion abnormalities. There is moderate concentric left ventricular hypertrophy. Left ventricular diastolic parameters are consistent with Grade I diastolic dysfunction (impaired relaxation).  2. Right ventricular systolic function is normal. The right ventricular size is normal. Tricuspid regurgitation signal is inadequate for assessing PA pressure.  3. The mitral valve is normal in structure. No evidence of mitral valve regurgitation. No evidence of mitral stenosis.  4. The aortic valve is tricuspid. There is mild calcification of the aortic valve. Aortic valve regurgitation is not visualized. No aortic stenosis is present.  5. Aortic dilatation noted. There is mild dilatation of the ascending  aorta, measuring 43 mm.  6. The inferior vena cava is normal in size with greater than 50% respiratory variability, suggesting right atrial pressure of 3 mmHg. FINDINGS  Left Ventricle: Left ventricular ejection fraction, by estimation, is 55 to 60%. The left ventricle has normal function. The left ventricle has no regional wall motion abnormalities. The left ventricular internal cavity size was normal in size. There is  moderate concentric left ventricular hypertrophy. Left ventricular diastolic parameters are consistent with Grade I diastolic dysfunction (impaired relaxation). Right Ventricle: The right ventricular size is normal. No increase in right ventricular wall thickness. Right ventricular systolic function is normal. Tricuspid regurgitation signal is inadequate for assessing PA pressure. Left Atrium: Left atrial size was normal in size. Right Atrium: Right atrial size was normal in size. Pericardium: There is no evidence of pericardial effusion. Mitral Valve: The mitral valve is normal in structure. Mild mitral annular calcification. No evidence of mitral valve regurgitation. No evidence of mitral valve stenosis. Tricuspid Valve: The tricuspid valve is normal in structure. Tricuspid valve regurgitation is not demonstrated. Aortic Valve: The aortic valve is tricuspid. There is mild calcification of the aortic valve. Aortic valve regurgitation is not visualized. No aortic stenosis is present. Pulmonic Valve: The pulmonic valve was normal in structure. Pulmonic valve regurgitation is not visualized. Aorta: Aortic dilatation noted. There is mild dilatation of the ascending aorta, measuring 43 mm. Venous: The inferior vena cava is normal in size with greater than 50% respiratory variability, suggesting right atrial pressure of 3 mmHg. IAS/Shunts: No atrial level shunt detected by color flow Doppler.  LEFT VENTRICLE PLAX 2D LVIDd:         4.20 cm   Diastology LVIDs:         2.90 cm   LV e' medial:    4.90 cm/s  LV PW:         1.20 cm   LV E/e' medial:  11.9 LV IVS:        1.40 cm   LV e' lateral:   10.40 cm/s LVOT diam:     2.10 cm   LV E/e' lateral: 5.6 LV SV:         59 LV SV Index:   28 LVOT Area:     3.46 cm  RIGHT VENTRICLE             IVC RV Basal diam:  2.80 cm     IVC diam: 1.70 cm RV S prime:     13.80 cm/s TAPSE (M-mode): 1.6 cm LEFT ATRIUM             Index        RIGHT ATRIUM           Index LA diam:        3.80 cm 1.78 cm/m   RA Area:     14.40 cm LA Vol (A2C):   44.3 ml 20.72 ml/m  RA Volume:   33.80 ml  15.81 ml/m LA Vol (A4C):   51.4 ml 24.04 ml/m LA Biplane Vol: 50.8 ml 23.76 ml/m  AORTIC VALVE LVOT Vmax:   98.50 cm/s LVOT Vmean:  65.500 cm/s LVOT VTI:    0.170 m  AORTA Ao Root diam: 3.50 cm Ao Asc diam:  4.30 cm MITRAL VALVE MV Area (PHT): 3.42 cm    SHUNTS MV Decel Time: 222 msec    Systemic VTI:  0.17 m MV E velocity: 58.20 cm/s  Systemic Diam: 2.10 cm MV A velocity: 61.60 cm/s MV E/A ratio:  0.94 Dalton McleanMD Electronically signed by Wilfred Lacy Signature Date/Time: 04/02/2023/5:07:42 PM    Final    CT ANGIO HEAD NECK W WO CM Result Date: 04/01/2023 CLINICAL DATA:  Left-sided ear pain, speech changes, off balance; acute and subacute infarcts on MRI EXAM: CT ANGIOGRAPHY HEAD AND NECK WITH AND WITHOUT CONTRAST TECHNIQUE: Multidetector CT imaging of the head and neck was performed using the standard protocol during bolus administration of intravenous contrast. Multiplanar CT image reconstructions and MIPs were obtained to evaluate the vascular anatomy. Carotid stenosis measurements (when applicable) are obtained utilizing NASCET criteria, using the distal internal carotid diameter as the denominator. RADIATION DOSE REDUCTION: This exam was performed according to the departmental dose-optimization program which includes automated exposure control, adjustment of the mA and/or kV according to patient size and/or use of iterative reconstruction technique. CONTRAST:  75mL OMNIPAQUE IOHEXOL  350 MG/ML SOLN COMPARISON:  No prior CTA available, correlation is made with 04/01/2023 CT head and 04/01/2023 MRI head FINDINGS: CT HEAD FINDINGS For noncontrast findings, please see same day CT head. CTA NECK FINDINGS Aortic arch: Standard branching. Imaged portion shows no evidence of aneurysm or dissection. No significant stenosis of the major arch vessel origins. Right carotid system: No evidence of dissection, occlusion, or hemodynamically significant stenosis (greater than 50%). Left carotid system: No evidence of dissection, occlusion, or hemodynamically significant stenosis (greater than 50%). Vertebral arteries: Nonopacification of the left vertebral artery from its origin through the skull base, which may be chronic. The right vertebral artery is patent from its origin to the skull base without significant stenosis. Skeleton: No acute osseous abnormality. Degenerative changes in the cervical spine. Other neck: No acute finding. Upper chest: No focal pulmonary opacity or pleural effusion. Review of the MIP images confirms the above findings CTA HEAD FINDINGS Anterior circulation: Both internal carotid arteries are patent to the termini, with moderate stenosis in the proximal right supraclinoid ICA (series 6, images 10 9-110) and mild stenosis in the proximal left supraclinoid ICA (series 6, image 109). A1 segments patent. Normal anterior communicating artery. Anterior cerebral arteries are patent to their distal aspects without significant stenosis. No M1 stenosis or occlusion. MCA branches perfused to their distal aspects without significant stenosis. Posterior circulation: The left vertebral artery is not  opacified in the V4 segment. The right vertebral artery is fairly well opacified in the proximal right V4 segment, with diminishing opacification in the more distal right V4 and at the vertebrobasilar junction. Poor opacification of basilar artery, which is not well seen. Better opacification in the  left greater than right P1 segment, with poor opacification of the P2 segments and more distal PCAs. The posterior communicating arteries are not definitively seen but are likely patent given minimal opacification of the posterior circulation. Venous sinuses: Not well opacified due to phase of timing. Anatomic variants: None significant. No evidence of aneurysm or vascular malformation. Review of the MIP images confirms the above findings IMPRESSION: 1. Nonopacification of the left vertebral artery from its origin through the vertebrobasilar junction, which may be chronic. 2. Poor opacification of the remainder of the posterior circulation, with fairly robust opacification the proximal right V4 and P1 segment, but poor opacification of the distal right V4, basilar, and more distal PCAs. 3. Moderate stenosis in the proximal right supraclinoid ICA and mild stenosis in the proximal left supraclinoid ICA. 4. No hemodynamically significant stenosis in the neck. These results will be called to the ordering clinician or representative by the Radiologist Assistant, and communication documented in the PACS or [employer]. Electronically Signed   By: Wiliam Ke M.D.   On: 04/01/2023 18:51   MR BRAIN WO CONTRAST Addendum Date: 04/01/2023 ADDENDUM REPORT: 04/01/2023 17:00 ADDENDUM: Critical Value/emergent results were called by telephone at the time of interpretation on 04/01/2023 at 4:58 pm to provider Dr. Larinda Buttery, who verbally acknowledged these results. Results were also discussed with the stroke neurologist on-call, Dr. Iver Nestle, at 4:39 p.m. Electronically Signed   By: Orvan Falconer M.D.   On: 04/01/2023 17:00   Result Date: 04/01/2023 CLINICAL DATA:  Neuro deficit, acute, stroke suspected. Balance difficulty. EXAM: MRI HEAD WITHOUT CONTRAST TECHNIQUE: Multiplanar, multiecho pulse sequences of the brain and surrounding structures were obtained without intravenous contrast. COMPARISON:  Head CT 04/01/2023.  FINDINGS: Brain: Scattered foci of acute and subacute infarction throughout the brainstem and left cerebellar hemisphere. Acute hemorrhage or significant mass effect. Background of mild chronic small-vessel disease. The previously questioned left occipital lobe infarct is chronic. No hydrocephalus or extra-axial collection. No mass or abnormal susceptibility Vascular: Loss of the distal vertebral and basilar artery flow voids, suspicious for occlusion. Skull and upper cervical spine: Normal marrow signal. Sinuses/Orbits: No acute findings. Other: None. IMPRESSION: 1. Scattered foci of acute and subacute infarction throughout the brainstem and left cerebellar hemisphere. 2. Loss of the distal vertebral and basilar artery flow voids, suspicious for occlusion. CTA of the head and neck is recommended for confirmation. Radiology assistant personnel have been notified to put me in telephone contact with the referring physician or the referring physician's clinical representative in order to discuss these findings. Once this communication is established I will issue an addendum to this report for documentation purposes. Electronically Signed: By: Orvan Falconer M.D. On: 04/01/2023 16:23   CT HEAD WO CONTRAST Result Date: 04/01/2023 CLINICAL DATA:  Left-sided ear pain EXAM: CT HEAD WITHOUT CONTRAST TECHNIQUE: Contiguous axial images were obtained from the base of the skull through the vertex without intravenous contrast. RADIATION DOSE REDUCTION: This exam was performed according to the departmental dose-optimization program which includes automated exposure control, adjustment of the mA and/or kV according to patient size and/or use of iterative reconstruction technique. COMPARISON:  Head CT 10/31/22 FINDINGS: Brain: Compared to prior exam there is a new age indeterminate infarct  in the left occipital lobe (series 3, image 17). No hemorrhage. No hydrocephalus. No extra-axial fluid collection. No mass effect. No mass  lesion. Vascular: No hyperdense vessel or unexpected calcification. Skull: Mild soft tissue swelling along the midline posterior scalp. Calvarial fracture. No focal bone lesion. Sinuses/Orbits: No middle ear or mastoid effusion. Paranasal sinuses are notable for mucosal thickening in the right sphenoid and bilateral maxillary sinuses. Orbits are unremarkable. Other: None. IMPRESSION: New age indeterminate infarct in the left occipital lobe. Recommend brain MRI for further evaluation. Electronically Signed   By: Lorenza Cambridge M.D.   On: 04/01/2023 10:02       HISTORY OF PRESENT ILLNESS 62 y.o. patient with history of diabetes and hypertension originally presented with left ear pain, tinnitus and disequilibrium.  HOSPITAL COURSE Patient was found to have left cerebellar and brainstem infarcts.  He was found to have stenosis of the basilar artery and right vertebral artery and underwent successful stent system angioplasty of his basilar artery on 12/20.  Blood pressure was tightly controlled in the ICU for the next 24 hours, and patient is now stable and ready to be discharged with outpatient PT.  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 Poor opacification of the remainder of the posterior circulation, with fairly robust opacification the proximal right V4 and P1 segment, but poor opacification of the distal right V4, basilar, and more distal PCAs. Moderate stenosis in the proximal right supraclinoid ICA and mild stenosis in the proximal left supraclinoid ICA. MRI  Scattered foci of acute and subacute infarction throughout the brainstem and left cerebellar hemisphere. Loss of the distal vertebral and basilar artery flow  voids, suspicious for occlusion.  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 Corliss Skains  Near occlusion of the  right vertebral basilar junction just distal to the right posterior inferior cerebellar  artery with a string sign opacification of the proximal one third to half  of the basilar artery.  Distal one third of the basilar artery opacifies  retrogradely via a severely diseased left posterior communicating artery.   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 loaded with brilinta 12/19 Stent assisted angioplasty of basilar artery performed 12/20   Hypertension Home meds:  None Unstable, currently requiring Cleviprex and labetalol SBP 120-140 for first 24 hours then less than 180   Hyperlipidemia Home meds:  None, resumed in hospital LDL 101, goal < 70 Add Crestor 40mg   Continue statin at discharge   Diabetes type II Controlled Home meds:  None HgbA1c 5.2, goal < 7.0 CBGs SSI Recommend close follow-up with PCP for better DM control   Dysphagia Patient has post-stroke dysphagia, SLP consulted      Diet    Diet Heart Room service appropriate? Yes; Fluid consistency: Thin        Advance diet as tolerated   Other Stroke Risk Factors ETOH use, alcohol level <10, advised to  drink no more than 2 drink(s) a day Obesity, Body mass index is 33.82 kg/m., BMI >/= 30 associated with increased stroke risk, recommend weight loss, diet and exercise as appropriate   RN Pressure Injury Documentation:     DISCHARGE EXAM  PHYSICAL EXAM General:  Alert, well-nourished, well-developed patient in no acute  distress Psych:  Mood and affect appropriate for situation CV: Regular rate and rhythm on monitor Respiratory:  Regular, unlabored respirations on room air  NEURO:  Mental Status: AA&Ox3, able to give clear and coherent history of present illness Speech/Language: speech is without dysarthria or aphasia.    Cranial Nerves:  II: PERRL. Visual fields full.  III, IV, VI: EOMI. Eyelids elevate symmetrically.  V: Sensation is intact to light touch and symmetrical to face.  VII: Smile is symmetrical.  VIII: hearing intact to voice. IX, X:  Phonation is normal.  XII: tongue is midline without fasciculations. Motor: 5/5 strength to all muscle groups tested.  Tone: is normal and bulk is normal Sensation- Intact to light touch bilaterally.  Coordination: FTN intact bilaterally Gait- deferred  NIHSS 0   Discharge Diet       Diet   Diet Heart Room service appropriate? Yes; Fluid consistency: Thin   liquids  DISCHARGE PLAN Disposition: Home aspirin 81 mg daily and Brilinta (ticagrelor) 90 mg bid for secondary stroke prevention for interventional radiology Ongoing stroke risk factor control by Primary Care Physician at time of discharge Follow-up PCP Simonne Martinet, MD in 2 weeks. Follow-up with interventional radiology in 2 weeks Follow-up in [facility] in 8 weeks, office to schedule an appointment.   35 minutes were spent preparing discharge.  Cortney E Ernestina Columbia , MSN, AGACNP-BC [facility] See Amion for schedule and pager information 04/06/2023 11:41 AM   I have personally obtained history,examined this patient, reviewed notes, independently viewed imaging studies, participated in medical decision making and plan of care.ROS completed by me personally and pertinent positives fully documented  I have made any additions or clarifications directly to the above note. Agree with note above.    Delia Heady, MD Medical  Director [facility] Pager: [phone] 04/06/2023 12:40 PM

## 2023-04-06 NOTE — Progress Notes (Signed)
Patient has been cleared for discharge by Dr. Pearlean Brownie, Dr. Corliss Skains, and Molly Maduro, with Stroke and the IR team. Patient has belongings at the bedside and has been accounted for by him, with the exception of a T-shirt, possibly left at the prior facility. PT/OT, and medical recommendations and appointments after discharged included in discharge paperwork.  Mammie Russian, RN

## 2023-04-06 NOTE — Plan of Care (Signed)
  Problem: Clinical Measurements: Goal: Ability to maintain clinical measurements within normal limits will improve Outcome: Progressing   Problem: Education: Goal: Knowledge of disease or condition will improve Outcome: Progressing Goal: Knowledge of patient specific risk factors will improve Gavin Dorsey N/A or DELETE if not current risk factor) Outcome: Progressing   Problem: Ischemic Stroke/TIA Tissue Perfusion: Goal: Complications of ischemic stroke/TIA will be minimized Outcome: Progressing

## 2023-04-06 NOTE — Progress Notes (Addendum)
Occupational Therapy Treatment Patient Details Name: Gavin Dorsey MRN: [id number] DOB: 08-09-1960 Today's Date: 04/06/2023   History of present illness Gavin Dorsey is a 62 yo male who presented with 2 week hx of L ear pain, dysequilibrium, and feeling off balance. MRI Brain and CTA with scattered brainstem and left cerebellar infarcts.  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 S/p revascularization 12/18, stents placed 12/20. PMHx: DM II, HTN   OT comments  Pt progressing towards established OT goals. Pt performing dressing and functional mobility at Mod I level (with and without RW). Pt continues to present with slight balance deficit and recommend using RW. Answering all questions in preparation for dc home. Continue to recommend dc to home once medically stable. All acute OT needs met.       If plan is discharge home, recommend the following:  Assistance with cooking/housework;Assist for transportation   Equipment Recommendations  Other (comment) (pending acute progress; RW)    Recommendations for Other Services      Precautions / Restrictions Precautions Precautions: Fall Restrictions Weight Bearing Restrictions Per Provider Order: No       Mobility Bed Mobility               General bed mobility comments: OOB with PT    Transfers Overall transfer level: Modified independent                 General transfer comment: increased time     Balance Overall balance assessment: Needs assistance Sitting-balance support: Feet supported Sitting balance-Leahy Scale: Good     Standing balance support: No upper extremity supported, During functional activity Standing balance-Leahy Scale: Fair                             ADL either performed or assessed with clinical judgement   ADL Overall ADL's : Modified independent                                       General ADL Comments: Pt performing functional mobility in  hallway, hand hygiene at sink, and collecting his clothes to then don at Mod I - increased time throughout. Pt with slight balance deficits but reports he feels much better. Reviewing not to drive untill cleared by MD; pt agreeable.    Extremity/Trunk Assessment Upper Extremity Assessment Upper Extremity Assessment: Overall WFL for tasks assessed   Lower Extremity Assessment Lower Extremity Assessment: Defer to PT evaluation        Vision   Vision Assessment?: No apparent visual deficits   Perception Perception Perception: Within Functional Limits   Praxis Praxis Praxis: WFL    Cognition Arousal: Alert Behavior During Therapy: WFL for tasks assessed/performed Overall Cognitive Status: Within Functional Limits for tasks assessed                                          Exercises      Shoulder Instructions       General Comments VSS    Pertinent Vitals/ Pain       Pain Assessment Pain Assessment: Faces Faces Pain Scale: No hurt Pain Intervention(s): Monitored during session  Home Living  Prior Functioning/Environment              Frequency  Min 1X/week        Progress Toward Goals  OT Goals(current goals can now be found in the care plan section)  Progress towards OT goals: Progressing toward goals  Acute Rehab OT Goals OT Goal Formulation: With patient Time For Goal Achievement: 04/16/23 Potential to Achieve Goals: Good ADL Goals Additional ADL Goal #1: Pt will complete all ADLs independently Additional ADL Goal #2: Pt well indep reall at least 3 fall prevention strategies to increased safety in the home setting  Plan      Co-evaluation                 AM-PAC OT "6 Clicks" Daily Activity     Outcome Measure   Help from another person eating meals?: None Help from another person taking care of personal grooming?: A Little Help from another person toileting,  which includes using toliet, bedpan, or urinal?: A Little Help from another person bathing (including washing, rinsing, drying)?: A Little Help from another person to put on and taking off regular upper body clothing?: A Little Help from another person to put on and taking off regular lower body clothing?: A Little 6 Click Score: 19    End of Session Equipment Utilized During Treatment: Gait belt;Rolling walker (2 wheels)  OT Visit Diagnosis: Unsteadiness on feet (R26.81);Other abnormalities of gait and mobility (R26.89);Muscle weakness (generalized) (M62.81);History of falling (Z91.81)   Activity Tolerance Patient tolerated treatment well   Patient Left with call bell/phone within reach;in chair   Nurse Communication Mobility status        Time: 8657-8469 OT Time Calculation (min): 15 min  Charges: OT General Charges $OT Visit: 1 Visit OT Treatments $Self Care/Home Management : 8-22 mins  Leelynn Whetsel MSOT, OTR/L Acute Rehab Office: [phone]  Theodoro Grist Jada Fass 04/06/2023, 11:46 AM

## 2023-04-06 NOTE — Discharge Instructions (Addendum)
Gavin Dorsey, you were admitted with a stroke in the brainstem and left cerebellum and were found to have narrowing of your basilar artery.  You underwent stenting of this artery, which was successful.  You will be discharged with outpatient physical therapy and will need to follow up in the stroke clinic in 8 weeks.  He will also need to follow-up with interventional radiology in 2 weeks, and they will call you to schedule this appointment.  You will need to take aspirin daily and Brilinta twice daily to prevent closing of your basilar artery.

## 2023-04-06 NOTE — Evaluation (Signed)
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) 04/04/2023   Basilar artery stenosis 04/02/2023   CVA (cerebral vascular accident) (HCC) 04/01/2023   Hypogonadism in male 04/01/2023   Hypertension 04/01/2023   Polycythemia 04/01/2023   Past Medical History:  Past Medical History:  Diagnosis Date   Diabetes mellitus without complication (HCC)    Hypertension    Past Surgical History:  Past Surgical History:  Procedure Laterality Date   IR ANGIO INTRA EXTRACRAN SEL COM CAROTID INNOMINATE BILAT MOD SED  04/02/2023   IR ANGIO VERTEBRAL SEL SUBCLAVIAN INNOMINATE UNI L MOD SED  04/02/2023   IR ANGIO VERTEBRAL SEL VERTEBRAL UNI R MOD SED  04/02/2023   IR US GUIDE VASC ACCESS RIGHT  04/02/2023   RADIOLOGY WITH ANESTHESIA N/A 04/04/2023   Procedure: basilar artery angioplasty;  Surgeon: Julieanne Cotton, MD;  Location: [facility] OR;  Service: Radiology;  Laterality: N/A;   HPI:  Gavin Dorsey is a 62 y.o. male with a history of DM and HTN. He presented to the ED with complaints of left ear pain, pain behind the left ear and tinnitus. This had been ongoing for two weeks and he then developed trouble with walking and balance. In the ED he was found to have scattered brainstem and cerebellar infarcts as well as left vertebral occlusion   Assessment / Plan / Recommendation Clinical Impression  Pt seen for speech/language cognitive assessment with administration of the [employer] [employer] Mental Status Examination (SLUMS) with a score obtained of 28/30 which falls within normal range for this assessment.  Pt required one category cue with object retrieval with time constraint and digit reversal past 3 digits, pt transposed  digits (pt stated this was evident prior to CVA).  Speech intelligible within conversation, pt Ox4 and appeared to exhibit no difficulty with naming or other areas of language acquisition.  Auditory comprehension adequate and cognition appeared intact for this screening.  Further cognitive assessment may be needed to determine if pt may have higher level cognitive functioning deficits.  No further acute needs.  All needs can be met at next venue of care if needed.  Thank you for this consult.    SLP Assessment  SLP Recommendation/Assessment: All further Speech Language Pathology  needs can be addressed in the next venue of care (prn) SLP Visit Diagnosis: Cognitive communication deficit (R41.841)    Recommendations for follow up therapy are one component of a multi-disciplinary discharge planning process, led by the attending physician.  Recommendations may be updated based on patient status, additional functional criteria and insurance authorization.    Follow Up Recommendations  Follow physician's recommendations for discharge plan and follow up therapies    Assistance Recommended at Discharge  PRN  Functional Status Assessment Patient has had a recent decline in their functional status and demonstrates the ability to make significant improvements in function in a reasonable and predictable amount of time.  Frequency and Duration Other (Comment) (evaluation only)         SLP Evaluation Cognition  Overall Cognitive Status: Within Functional Limits for tasks assessed Arousal/Alertness: Awake/alert Orientation Level: Oriented X4 Year: 2024 Month: December Day of Week: Correct Attention: Sustained Sustained Attention: Appears intact Memory: Appears intact Awareness: Appears intact Problem Solving: Appears intact Safety/Judgment: Appears  intact Comments: knows driving is restricted; staying with a friend initially when d/c       Comprehension  Auditory Comprehension Overall Auditory  Comprehension: Appears within functional limits for tasks assessed Conversation: Simple Visual Recognition/Discrimination Discrimination: Within Function Limits Reading Comprehension Reading Status: Within funtional limits    Expression Expression Primary Mode of Expression: Verbal Verbal Expression Overall Verbal Expression: Appears within functional limits for tasks assessed Level of Generative/Spontaneous Verbalization: Conversation Naming: No impairment Non-Verbal Means of Communication: Not applicable Written Expression Dominant Hand: Right Written Expression: Within Functional Limits   Oral / Motor  Oral Motor/Sensory Function Overall Oral Motor/Sensory Function: Within functional limits Motor Speech Overall Motor Speech: Appears within functional limits for tasks assessed Respiration: Within functional limits Phonation: Normal Resonance: Within functional limits Articulation: Within functional limitis Intelligibility: Intelligible Motor Planning: Witnin functional limits Motor Speech Errors: Not applicable            Pat Kemuel Buchmann,M.S.,CCC-SLP 04/06/2023, 12:05 PM

## 2023-04-06 NOTE — TOC Progression Note (Addendum)
Transition of Care [facility]) - Progression Note    Patient Details  Name: Gavin Dorsey MRN: [id number] Date of Birth: 06-17-1960  [facility] CM/SW Contact  Ronny Bacon, RN Phone Number: 04/06/2023, 11:21 AM  Clinical Narrative: Secure message received from therapy regarding patient needing RW and outpatient neuro rehab for PT. Spoke with patient by phone, reports that he does not have medical insurance coverage at this time, he is waiting for medicaid approval. Patient reports will obtain RW using outside resources. Referral will be placed for Outpatient Neuro rehab.    1128: Outpatient neuro rehab referral # [id number], placed to [facility] outpatient rehab. Contact information placed on AVS.         Expected Discharge Plan and Services                                               Social Determinants of Health (SDOH) Interventions SDOH Screenings   Food Insecurity: No Food Insecurity (04/02/2023)  Housing: Low Risk  (04/02/2023)  Transportation Needs: No Transportation Needs (04/02/2023)  Utilities: Not At Risk (04/02/2023)  Tobacco Use: Low Risk  (04/01/2023)    Readmission Risk Interventions     No data to display

## 2023-04-07 ENCOUNTER — Other Ambulatory Visit: Payer: Self-pay | Admitting: Nurse Practitioner

## 2023-04-07 ENCOUNTER — Other Ambulatory Visit (HOSPITAL_COMMUNITY): Payer: Self-pay

## 2023-04-07 ENCOUNTER — Other Ambulatory Visit: Payer: Self-pay

## 2023-04-07 ENCOUNTER — Telehealth: Payer: Self-pay | Admitting: Neurology

## 2023-04-07 LAB — GLUCOSE, CAPILLARY: Glucose-Capillary: 102 mg/dL — ABNORMAL HIGH (ref 70–99)

## 2023-04-07 MED ORDER — TICAGRELOR 90 MG PO TABS
90.0000 mg | ORAL_TABLET | Freq: Two times a day (BID) | ORAL | 2 refills | Status: DC
  Filled 2023-04-07: qty 60, 30d supply, fill #0

## 2023-04-07 MED ORDER — TICAGRELOR 60 MG PO TABS: 90.0000 mg | ORAL_TABLET | Freq: Two times a day (BID) | ORAL | Status: DC

## 2023-04-07 NOTE — Anesthesia Postprocedure Evaluation (Signed)
Anesthesia Post Note  Patient: Gavin Dorsey  Procedure(s) Performed: basilar artery angioplasty     Patient location during evaluation: PACU Anesthesia Type: General Level of consciousness: awake Pain management: pain level controlled Vital Signs Assessment: post-procedure vital signs reviewed and stable Respiratory status: spontaneous breathing, nonlabored ventilation and respiratory function stable Cardiovascular status: blood pressure returned to baseline and stable Postop Assessment: no apparent nausea or vomiting Anesthetic complications: no   No notable events documented.  Last Vitals:  Vitals:   04/06/23 0830 04/06/23 1000  BP:  124/72  Pulse: 77 79  Resp: 12 (!) 28  Temp:    SpO2: 95% 94%    Last Pain:  Vitals:   04/06/23 0800  TempSrc: Oral  PainSc: 0-No pain                 Rashawnda Gaba P Briyan Kleven

## 2023-04-07 NOTE — Progress Notes (Unsigned)
Patient sent a message stating that he did not have his insurance coverage and could not afford his Brilinta.  Prescription for Brilinta called into to [facility] care pharmacy with 30-day coupon.  Contacted patient and instructed him to pick up Brilinta and begin taking it immediately.

## 2023-04-07 NOTE — Telephone Encounter (Signed)
Patient called and left message. Stated he was given a prescription of Brilinta at hospital and he has no insurance until 04/16/23, states it costs almost $500 dollar to fill without insurance, is there something else he can be given in place of this? Or he asked if 9 days without this medication would mess him up? Please advise

## 2023-04-10 ENCOUNTER — Inpatient Hospital Stay (HOSPITAL_COMMUNITY)
Admission: EM | Admit: 2023-04-10 | Discharge: 2023-04-16 | DRG: 871 | Disposition: A | Discharge status: Home / Self Care | Payer: Self-pay | Attending: Internal Medicine | Admitting: Internal Medicine

## 2023-04-10 ENCOUNTER — Other Ambulatory Visit: Payer: Self-pay

## 2023-04-10 ENCOUNTER — Encounter (HOSPITAL_COMMUNITY): Payer: Self-pay

## 2023-04-10 DIAGNOSIS — R197 Diarrhea, unspecified: Secondary | ICD-10-CM

## 2023-04-10 DIAGNOSIS — K921 Melena: Secondary | ICD-10-CM | POA: Diagnosis present

## 2023-04-10 DIAGNOSIS — K5730 Diverticulosis of large intestine without perforation or abscess without bleeding: Secondary | ICD-10-CM | POA: Diagnosis present

## 2023-04-10 DIAGNOSIS — R4701 Aphasia: Secondary | ICD-10-CM | POA: Diagnosis present

## 2023-04-10 DIAGNOSIS — Z95820 Peripheral vascular angioplasty status with implants and grafts: Secondary | ICD-10-CM

## 2023-04-10 DIAGNOSIS — E785 Hyperlipidemia, unspecified: Secondary | ICD-10-CM | POA: Diagnosis present

## 2023-04-10 DIAGNOSIS — Z7984 Long term (current) use of oral hypoglycemic drugs: Secondary | ICD-10-CM

## 2023-04-10 DIAGNOSIS — E876 Hypokalemia: Secondary | ICD-10-CM | POA: Diagnosis not present

## 2023-04-10 DIAGNOSIS — Z6834 Body mass index (BMI) 34.0-34.9, adult: Secondary | ICD-10-CM

## 2023-04-10 DIAGNOSIS — E872 Acidosis, unspecified: Secondary | ICD-10-CM | POA: Diagnosis present

## 2023-04-10 DIAGNOSIS — J209 Acute bronchitis, unspecified: Secondary | ICD-10-CM | POA: Diagnosis present

## 2023-04-10 DIAGNOSIS — I69354 Hemiplegia and hemiparesis following cerebral infarction affecting left non-dominant side: Secondary | ICD-10-CM

## 2023-04-10 DIAGNOSIS — K920 Hematemesis: Secondary | ICD-10-CM | POA: Diagnosis present

## 2023-04-10 DIAGNOSIS — A419 Sepsis, unspecified organism: Principal | ICD-10-CM | POA: Diagnosis present

## 2023-04-10 DIAGNOSIS — N1831 Chronic kidney disease, stage 3a: Secondary | ICD-10-CM | POA: Diagnosis present

## 2023-04-10 DIAGNOSIS — I491 Atrial premature depolarization: Secondary | ICD-10-CM | POA: Diagnosis present

## 2023-04-10 DIAGNOSIS — I6523 Occlusion and stenosis of bilateral carotid arteries: Secondary | ICD-10-CM | POA: Diagnosis present

## 2023-04-10 DIAGNOSIS — T45525A Adverse effect of antithrombotic drugs, initial encounter: Secondary | ICD-10-CM | POA: Diagnosis present

## 2023-04-10 DIAGNOSIS — N179 Acute kidney failure, unspecified: Secondary | ICD-10-CM | POA: Diagnosis present

## 2023-04-10 DIAGNOSIS — I629 Nontraumatic intracranial hemorrhage, unspecified: Secondary | ICD-10-CM

## 2023-04-10 DIAGNOSIS — I5032 Chronic diastolic (congestive) heart failure: Secondary | ICD-10-CM | POA: Diagnosis present

## 2023-04-10 DIAGNOSIS — E669 Obesity, unspecified: Secondary | ICD-10-CM | POA: Diagnosis present

## 2023-04-10 DIAGNOSIS — I700 Atherosclerosis of aorta: Secondary | ICD-10-CM | POA: Diagnosis present

## 2023-04-10 DIAGNOSIS — I6502 Occlusion and stenosis of left vertebral artery: Secondary | ICD-10-CM | POA: Diagnosis present

## 2023-04-10 DIAGNOSIS — I130 Hypertensive heart and chronic kidney disease with heart failure and stage 1 through stage 4 chronic kidney disease, or unspecified chronic kidney disease: Secondary | ICD-10-CM | POA: Diagnosis present

## 2023-04-10 DIAGNOSIS — D62 Acute posthemorrhagic anemia: Secondary | ICD-10-CM | POA: Diagnosis present

## 2023-04-10 DIAGNOSIS — E119 Type 2 diabetes mellitus without complications: Secondary | ICD-10-CM

## 2023-04-10 DIAGNOSIS — Z79899 Other long term (current) drug therapy: Secondary | ICD-10-CM

## 2023-04-10 DIAGNOSIS — K922 Gastrointestinal hemorrhage, unspecified: Secondary | ICD-10-CM

## 2023-04-10 DIAGNOSIS — I651 Occlusion and stenosis of basilar artery: Secondary | ICD-10-CM | POA: Diagnosis present

## 2023-04-10 DIAGNOSIS — I6529 Occlusion and stenosis of unspecified carotid artery: Secondary | ICD-10-CM

## 2023-04-10 DIAGNOSIS — N182 Chronic kidney disease, stage 2 (mild): Secondary | ICD-10-CM | POA: Insufficient documentation

## 2023-04-10 DIAGNOSIS — I613 Nontraumatic intracerebral hemorrhage in brain stem: Secondary | ICD-10-CM | POA: Diagnosis present

## 2023-04-10 DIAGNOSIS — N400 Enlarged prostate without lower urinary tract symptoms: Secondary | ICD-10-CM | POA: Diagnosis present

## 2023-04-10 DIAGNOSIS — G4730 Sleep apnea, unspecified: Secondary | ICD-10-CM | POA: Diagnosis present

## 2023-04-10 DIAGNOSIS — Z8673 Personal history of transient ischemic attack (TIA), and cerebral infarction without residual deficits: Secondary | ICD-10-CM

## 2023-04-10 DIAGNOSIS — Z7982 Long term (current) use of aspirin: Secondary | ICD-10-CM

## 2023-04-10 DIAGNOSIS — I10 Essential (primary) hypertension: Secondary | ICD-10-CM | POA: Diagnosis present

## 2023-04-10 DIAGNOSIS — R6521 Severe sepsis with septic shock: Secondary | ICD-10-CM | POA: Diagnosis present

## 2023-04-10 DIAGNOSIS — I69391 Dysphagia following cerebral infarction: Secondary | ICD-10-CM

## 2023-04-10 DIAGNOSIS — I77810 Thoracic aortic ectasia: Secondary | ICD-10-CM | POA: Diagnosis present

## 2023-04-10 DIAGNOSIS — R471 Dysarthria and anarthria: Secondary | ICD-10-CM | POA: Diagnosis present

## 2023-04-10 DIAGNOSIS — N3000 Acute cystitis without hematuria: Secondary | ICD-10-CM | POA: Diagnosis present

## 2023-04-10 DIAGNOSIS — M48061 Spinal stenosis, lumbar region without neurogenic claudication: Secondary | ICD-10-CM | POA: Diagnosis present

## 2023-04-10 DIAGNOSIS — K30 Functional dyspepsia: Secondary | ICD-10-CM | POA: Diagnosis present

## 2023-04-10 DIAGNOSIS — Z7902 Long term (current) use of antithrombotics/antiplatelets: Secondary | ICD-10-CM

## 2023-04-10 DIAGNOSIS — G4736 Sleep related hypoventilation in conditions classified elsewhere: Secondary | ICD-10-CM | POA: Diagnosis present

## 2023-04-10 DIAGNOSIS — E1122 Type 2 diabetes mellitus with diabetic chronic kidney disease: Secondary | ICD-10-CM | POA: Diagnosis present

## 2023-04-10 LAB — CBC WITH DIFFERENTIAL/PLATELET
Abs Immature Granulocytes: 0.08 10*3/uL — ABNORMAL HIGH (ref 0.00–0.07)
Basophils Absolute: 0.1 10*3/uL (ref 0.0–0.1)
Basophils Relative: 0 %
Eosinophils Absolute: 0.2 10*3/uL (ref 0.0–0.5)
Eosinophils Relative: 1 %
HCT: 36.9 % — ABNORMAL LOW (ref 39.0–52.0)
Hemoglobin: 12.1 g/dL — ABNORMAL LOW (ref 13.0–17.0)
Immature Granulocytes: 1 %
Lymphocytes Relative: 12 %
Lymphs Abs: 2 10*3/uL (ref 0.7–4.0)
MCH: 28.1 pg (ref 26.0–34.0)
MCHC: 32.8 g/dL (ref 30.0–36.0)
MCV: 85.6 fL (ref 80.0–100.0)
Monocytes Absolute: 1.6 10*3/uL — ABNORMAL HIGH (ref 0.1–1.0)
Monocytes Relative: 10 %
Neutro Abs: 13 10*3/uL — ABNORMAL HIGH (ref 1.7–7.7)
Neutrophils Relative %: 76 %
Platelets: 337 10*3/uL (ref 150–400)
RBC: 4.31 MIL/uL (ref 4.22–5.81)
RDW: 16.4 % — ABNORMAL HIGH (ref 11.5–15.5)
WBC: 17 10*3/uL — ABNORMAL HIGH (ref 4.0–10.5)
nRBC: 0 % (ref 0.0–0.2)

## 2023-04-10 LAB — URINALYSIS, ROUTINE W REFLEX MICROSCOPIC
Bilirubin Urine: NEGATIVE
Glucose, UA: NEGATIVE mg/dL
Ketones, ur: 5 mg/dL — AB
Nitrite: NEGATIVE
Protein, ur: NEGATIVE mg/dL
Specific Gravity, Urine: 1.025 (ref 1.005–1.030)
pH: 5 (ref 5.0–8.0)

## 2023-04-10 LAB — COMPREHENSIVE METABOLIC PANEL
ALT: 21 U/L (ref 0–44)
AST: 21 U/L (ref 15–41)
Albumin: 3 g/dL — ABNORMAL LOW (ref 3.5–5.0)
Alkaline Phosphatase: 55 U/L (ref 38–126)
Anion gap: 10 (ref 5–15)
BUN: 71 mg/dL — ABNORMAL HIGH (ref 8–23)
CO2: 21 mmol/L — ABNORMAL LOW (ref 22–32)
Calcium: 8.8 mg/dL — ABNORMAL LOW (ref 8.9–10.3)
Chloride: 106 mmol/L (ref 98–111)
Creatinine, Ser: 1.56 mg/dL — ABNORMAL HIGH (ref 0.61–1.24)
GFR, Estimated: 50 mL/min — ABNORMAL LOW (ref >60)
Glucose, Bld: 158 mg/dL — ABNORMAL HIGH (ref 70–99)
Potassium: 4.4 mmol/L (ref 3.5–5.1)
Sodium: 137 mmol/L (ref 135–145)
Total Bilirubin: 0.6 mg/dL (ref <1.2)
Total Protein: 6.4 g/dL — ABNORMAL LOW (ref 6.5–8.1)

## 2023-04-10 LAB — LIPASE, BLOOD: Lipase: 29 U/L (ref 11–51)

## 2023-04-10 MED ORDER — VANCOMYCIN HCL IN DEXTROSE 1-5 GM/200ML-% IV SOLN
1000.0000 mg | Freq: Once | INTRAVENOUS | Status: AC
  Administered 2023-04-11: 1000 mg via INTRAVENOUS
  Filled 2023-04-10: qty 200

## 2023-04-10 MED ORDER — VANCOMYCIN HCL IN DEXTROSE 1-5 GM/200ML-% IV SOLN
1000.0000 mg | Freq: Once | INTRAVENOUS | Status: AC
  Administered 2023-04-11: 1000 mg via INTRAVENOUS

## 2023-04-10 MED ORDER — METRONIDAZOLE 500 MG/100ML IV SOLN
500.0000 mg | Freq: Once | INTRAVENOUS | Status: AC
  Administered 2023-04-11: 500 mg via INTRAVENOUS
  Filled 2023-04-10: qty 100

## 2023-04-10 MED ORDER — LACTATED RINGERS IV SOLN: INTRAVENOUS | Status: DC

## 2023-04-10 MED ORDER — PANTOPRAZOLE SODIUM 40 MG IV SOLR
40.0000 mg | Freq: Once | INTRAVENOUS | Status: AC
  Administered 2023-04-11: 40 mg via INTRAVENOUS
  Filled 2023-04-10: qty 10

## 2023-04-10 MED ORDER — VANCOMYCIN HCL IN DEXTROSE 1-5 GM/200ML-% IV SOLN
1000.0000 mg | Freq: Once | INTRAVENOUS | Status: DC
  Filled 2023-04-10: qty 200

## 2023-04-10 MED ORDER — LACTATED RINGERS IV BOLUS (SEPSIS)
1000.0000 mL | Freq: Once | INTRAVENOUS | Status: AC
  Administered 2023-04-11: 1000 mL via INTRAVENOUS

## 2023-04-10 MED ORDER — SODIUM CHLORIDE 0.9 % IV SOLN
2.0000 g | Freq: Once | INTRAVENOUS | Status: AC
  Administered 2023-04-10: 2 g via INTRAVENOUS
  Filled 2023-04-10: qty 12.5

## 2023-04-10 MED ORDER — LACTATED RINGERS IV BOLUS (SEPSIS)
1000.0000 mL | Freq: Once | INTRAVENOUS | Status: AC
  Administered 2023-04-10: 1000 mL via INTRAVENOUS

## 2023-04-10 MED ORDER — LACTATED RINGERS IV BOLUS (SEPSIS)
500.0000 mL | Freq: Once | INTRAVENOUS | Status: AC
  Administered 2023-04-11: 500 mL via INTRAVENOUS

## 2023-04-10 NOTE — ED Triage Notes (Signed)
Pt was recently hospitalized for a stroke. Placed on brilenta for clots. Pt reports dark stools, blood in emesis, and feeling weak in the left arm all day today. Started before 12 noon.

## 2023-04-10 NOTE — Progress Notes (Addendum)
Elink monitoring for the code sepsis protocol.  0210 Notified bedside nurse of need to draw lactic acid.

## 2023-04-10 NOTE — ED Provider Triage Note (Signed)
Emergency Medicine Provider Triage Evaluation Note  Gavin Dorsey , a 62 y.o. male  was evaluated in triage.  Pt complains of generalized weakness, dark stool and emesis that started today. He reports BM 3x today.  He continues to feel weak on left side without full resolution since being d/c 12/22  Recently admitted for CVA causing left sided weakness Sent home on 91 ASA and Brilinta 90mg  BID  Review of Systems  Positive: generalized weakness, dark stool and emesis Negative: fever  Physical Exam  BP 101/63 (BP Location: Right Arm)   Pulse (!) 125   Temp 98.7 F (37.1 C) (Oral)   Resp 19   Ht 5\' 8"  (1.727 m)   Wt 100.2 kg   SpO2 100%   BMI 33.60 kg/m  Gen:   Awake, no distress   Resp:  Normal effort  MSK:   Moves extremities without difficulty  Other:  Jaundiced  Medical Decision Making  Medically screening exam initiated at 9:03 PM.  Appropriate orders placed.  GERMAN CHAKRABORTY was informed that the remainder of the evaluation will be completed by another provider, this initial triage assessment does not replace that evaluation, and the importance of remaining in the ED until their evaluation is complete.  Labs ordered   Judithann Sheen, [place] 04/10/23 2106

## 2023-04-11 ENCOUNTER — Encounter (HOSPITAL_COMMUNITY): Payer: Self-pay | Admitting: Internal Medicine

## 2023-04-11 ENCOUNTER — Inpatient Hospital Stay (HOSPITAL_COMMUNITY): Payer: Self-pay

## 2023-04-11 ENCOUNTER — Emergency Department (HOSPITAL_COMMUNITY): Payer: Self-pay

## 2023-04-11 DIAGNOSIS — E1122 Type 2 diabetes mellitus with diabetic chronic kidney disease: Secondary | ICD-10-CM | POA: Diagnosis present

## 2023-04-11 DIAGNOSIS — Z8673 Personal history of transient ischemic attack (TIA), and cerebral infarction without residual deficits: Secondary | ICD-10-CM

## 2023-04-11 DIAGNOSIS — I6529 Occlusion and stenosis of unspecified carotid artery: Secondary | ICD-10-CM | POA: Diagnosis not present

## 2023-04-11 DIAGNOSIS — I619 Nontraumatic intracerebral hemorrhage, unspecified: Secondary | ICD-10-CM | POA: Diagnosis not present

## 2023-04-11 DIAGNOSIS — E119 Type 2 diabetes mellitus without complications: Secondary | ICD-10-CM

## 2023-04-11 DIAGNOSIS — A419 Sepsis, unspecified organism: Secondary | ICD-10-CM

## 2023-04-11 DIAGNOSIS — G4736 Sleep related hypoventilation in conditions classified elsewhere: Secondary | ICD-10-CM | POA: Diagnosis present

## 2023-04-11 DIAGNOSIS — I651 Occlusion and stenosis of basilar artery: Secondary | ICD-10-CM

## 2023-04-11 DIAGNOSIS — J40 Bronchitis, not specified as acute or chronic: Secondary | ICD-10-CM | POA: Diagnosis not present

## 2023-04-11 DIAGNOSIS — K921 Melena: Secondary | ICD-10-CM

## 2023-04-11 DIAGNOSIS — N3000 Acute cystitis without hematuria: Secondary | ICD-10-CM | POA: Diagnosis present

## 2023-04-11 DIAGNOSIS — E7849 Other hyperlipidemia: Secondary | ICD-10-CM

## 2023-04-11 DIAGNOSIS — K922 Gastrointestinal hemorrhage, unspecified: Secondary | ICD-10-CM | POA: Diagnosis present

## 2023-04-11 DIAGNOSIS — K920 Hematemesis: Secondary | ICD-10-CM | POA: Diagnosis present

## 2023-04-11 DIAGNOSIS — T45525A Adverse effect of antithrombotic drugs, initial encounter: Secondary | ICD-10-CM

## 2023-04-11 DIAGNOSIS — R6521 Severe sepsis with septic shock: Secondary | ICD-10-CM | POA: Diagnosis present

## 2023-04-11 DIAGNOSIS — N390 Urinary tract infection, site not specified: Secondary | ICD-10-CM

## 2023-04-11 DIAGNOSIS — I10 Essential (primary) hypertension: Secondary | ICD-10-CM

## 2023-04-11 DIAGNOSIS — I6502 Occlusion and stenosis of left vertebral artery: Secondary | ICD-10-CM | POA: Diagnosis present

## 2023-04-11 DIAGNOSIS — R4701 Aphasia: Secondary | ICD-10-CM | POA: Diagnosis present

## 2023-04-11 DIAGNOSIS — I700 Atherosclerosis of aorta: Secondary | ICD-10-CM | POA: Diagnosis present

## 2023-04-11 DIAGNOSIS — E872 Acidosis, unspecified: Secondary | ICD-10-CM | POA: Diagnosis present

## 2023-04-11 DIAGNOSIS — N179 Acute kidney failure, unspecified: Secondary | ICD-10-CM | POA: Diagnosis present

## 2023-04-11 DIAGNOSIS — E785 Hyperlipidemia, unspecified: Secondary | ICD-10-CM | POA: Diagnosis present

## 2023-04-11 DIAGNOSIS — I610 Nontraumatic intracerebral hemorrhage in hemisphere, subcortical: Secondary | ICD-10-CM | POA: Diagnosis not present

## 2023-04-11 DIAGNOSIS — I613 Nontraumatic intracerebral hemorrhage in brain stem: Secondary | ICD-10-CM | POA: Diagnosis present

## 2023-04-11 DIAGNOSIS — R197 Diarrhea, unspecified: Secondary | ICD-10-CM

## 2023-04-11 DIAGNOSIS — I5032 Chronic diastolic (congestive) heart failure: Secondary | ICD-10-CM | POA: Diagnosis present

## 2023-04-11 DIAGNOSIS — I130 Hypertensive heart and chronic kidney disease with heart failure and stage 1 through stage 4 chronic kidney disease, or unspecified chronic kidney disease: Secondary | ICD-10-CM | POA: Diagnosis present

## 2023-04-11 DIAGNOSIS — K30 Functional dyspepsia: Secondary | ICD-10-CM | POA: Diagnosis present

## 2023-04-11 DIAGNOSIS — I629 Nontraumatic intracranial hemorrhage, unspecified: Secondary | ICD-10-CM

## 2023-04-11 DIAGNOSIS — N1831 Chronic kidney disease, stage 3a: Secondary | ICD-10-CM | POA: Diagnosis present

## 2023-04-11 DIAGNOSIS — E876 Hypokalemia: Secondary | ICD-10-CM | POA: Diagnosis not present

## 2023-04-11 DIAGNOSIS — D62 Acute posthemorrhagic anemia: Secondary | ICD-10-CM | POA: Diagnosis present

## 2023-04-11 DIAGNOSIS — I69391 Dysphagia following cerebral infarction: Secondary | ICD-10-CM | POA: Diagnosis not present

## 2023-04-11 DIAGNOSIS — N182 Chronic kidney disease, stage 2 (mild): Secondary | ICD-10-CM | POA: Insufficient documentation

## 2023-04-11 DIAGNOSIS — N400 Enlarged prostate without lower urinary tract symptoms: Secondary | ICD-10-CM | POA: Diagnosis present

## 2023-04-11 DIAGNOSIS — I69354 Hemiplegia and hemiparesis following cerebral infarction affecting left non-dominant side: Secondary | ICD-10-CM | POA: Diagnosis not present

## 2023-04-11 DIAGNOSIS — E669 Obesity, unspecified: Secondary | ICD-10-CM | POA: Diagnosis present

## 2023-04-11 LAB — COMPREHENSIVE METABOLIC PANEL
ALT: 19 U/L (ref 0–44)
AST: 21 U/L (ref 15–41)
Albumin: 2.9 g/dL — ABNORMAL LOW (ref 3.5–5.0)
Alkaline Phosphatase: 57 U/L (ref 38–126)
Anion gap: 9 (ref 5–15)
BUN: 60 mg/dL — ABNORMAL HIGH (ref 8–23)
CO2: 23 mmol/L (ref 22–32)
Calcium: 8.5 mg/dL — ABNORMAL LOW (ref 8.9–10.3)
Chloride: 104 mmol/L (ref 98–111)
Creatinine, Ser: 1.37 mg/dL — ABNORMAL HIGH (ref 0.61–1.24)
GFR, Estimated: 58 mL/min — ABNORMAL LOW (ref >60)
Glucose, Bld: 112 mg/dL — ABNORMAL HIGH (ref 70–99)
Potassium: 4.2 mmol/L (ref 3.5–5.1)
Sodium: 136 mmol/L (ref 135–145)
Total Bilirubin: 0.7 mg/dL (ref <1.2)
Total Protein: 6.2 g/dL — ABNORMAL LOW (ref 6.5–8.1)

## 2023-04-11 LAB — I-STAT CG4 LACTIC ACID, ED
Lactic Acid, Venous: 1.8 mmol/L (ref 0.5–1.9)
Lactic Acid, Venous: 1.7 mmol/L (ref 0.5–1.9)

## 2023-04-11 LAB — BPAM RBC
Blood Product Expiration Date: 202501062359
ISSUE DATE / TIME: 202412270626
Unit Type and Rh: 9500

## 2023-04-11 LAB — CBC
HCT: 25.4 % — ABNORMAL LOW (ref 39.0–52.0)
Hemoglobin: 8.3 g/dL — ABNORMAL LOW (ref 13.0–17.0)
MCH: 28.5 pg (ref 26.0–34.0)
MCHC: 32.7 g/dL (ref 30.0–36.0)
MCV: 87.3 fL (ref 80.0–100.0)
Platelets: 222 10*3/uL (ref 150–400)
RBC: 2.91 MIL/uL — ABNORMAL LOW (ref 4.22–5.81)
RDW: 16.6 % — ABNORMAL HIGH (ref 11.5–15.5)
WBC: 10.6 10*3/uL — ABNORMAL HIGH (ref 4.0–10.5)
nRBC: 0 % (ref 0.0–0.2)

## 2023-04-11 LAB — RESP PANEL BY RT-PCR (RSV, FLU A&B, COVID)  RVPGX2
Influenza A by PCR: NEGATIVE
Influenza B by PCR: NEGATIVE
Resp Syncytial Virus by PCR: NEGATIVE
SARS Coronavirus 2 by RT PCR: NEGATIVE

## 2023-04-11 LAB — TYPE AND SCREEN
ABO/RH(D): O NEG
Antibody Screen: NEGATIVE
Unit division: 0

## 2023-04-11 LAB — POC OCCULT BLOOD, ED: Fecal Occult Bld: POSITIVE — AB

## 2023-04-11 LAB — CBG MONITORING, ED
Glucose-Capillary: 85 mg/dL (ref 70–99)
Glucose-Capillary: 78 mg/dL (ref 70–99)

## 2023-04-11 LAB — PREPARE RBC (CROSSMATCH)

## 2023-04-11 MED ORDER — METRONIDAZOLE 500 MG/100ML IV SOLN
500.0000 mg | Freq: Two times a day (BID) | INTRAVENOUS | Status: DC
  Administered 2023-04-11 – 2023-04-12 (×3): 500 mg via INTRAVENOUS
  Filled 2023-04-11 (×3): qty 100

## 2023-04-11 MED ORDER — ACETAMINOPHEN 325 MG PO TABS: 650.0000 mg | ORAL_TABLET | Freq: Four times a day (QID) | ORAL | Status: DC | PRN

## 2023-04-11 MED ORDER — PANTOPRAZOLE SODIUM 40 MG IV SOLR
40.0000 mg | Freq: Two times a day (BID) | INTRAVENOUS | Status: DC
  Administered 2023-04-11 – 2023-04-15 (×10): 40 mg via INTRAVENOUS
  Filled 2023-04-11 (×10): qty 10

## 2023-04-11 MED ORDER — SODIUM CHLORIDE 0.9% FLUSH
3.0000 mL | Freq: Two times a day (BID) | INTRAVENOUS | Status: DC
  Administered 2023-04-11: 3 mL via INTRAVENOUS

## 2023-04-11 MED ORDER — ASPIRIN 81 MG PO TBEC
81.0000 mg | DELAYED_RELEASE_TABLET | Freq: Every day | ORAL | Status: DC
  Administered 2023-04-11 – 2023-04-16 (×6): 81 mg via ORAL
  Filled 2023-04-11 (×6): qty 1

## 2023-04-11 MED ORDER — ONDANSETRON HCL 4 MG/2ML IJ SOLN: 4.0000 mg | Freq: Four times a day (QID) | INTRAMUSCULAR | Status: DC | PRN

## 2023-04-11 MED ORDER — ROSUVASTATIN CALCIUM 20 MG PO TABS
40.0000 mg | ORAL_TABLET | Freq: Every day | ORAL | Status: DC
  Administered 2023-04-11 – 2023-04-16 (×6): 40 mg via ORAL
  Filled 2023-04-11 (×6): qty 2

## 2023-04-11 MED ORDER — ASPIRIN 81 MG PO CHEW: 81.0000 mg | CHEWABLE_TABLET | Freq: Every day | ORAL | Status: DC

## 2023-04-11 MED ORDER — ONDANSETRON HCL 4 MG PO TABS
4.0000 mg | ORAL_TABLET | Freq: Four times a day (QID) | ORAL | Status: DC | PRN
Start: 2023-04-11 — End: 2023-04-16

## 2023-04-11 MED ORDER — SODIUM CHLORIDE 0.9% IV SOLUTION: Freq: Once | INTRAVENOUS | Status: AC

## 2023-04-11 MED ORDER — VANCOMYCIN HCL IN DEXTROSE 1-5 GM/200ML-% IV SOLN
1000.0000 mg | INTRAVENOUS | Status: DC
  Administered 2023-04-12: 1000 mg via INTRAVENOUS
  Filled 2023-04-11: qty 200

## 2023-04-11 MED ORDER — LACTATED RINGERS IV SOLN: INTRAVENOUS | Status: AC

## 2023-04-11 MED ORDER — IOHEXOL 350 MG/ML SOLN
75.0000 mL | Freq: Once | INTRAVENOUS | Status: AC | PRN
  Administered 2023-04-11: 75 mL via INTRAVENOUS

## 2023-04-11 MED ORDER — ACETAMINOPHEN 650 MG RE SUPP: 650.0000 mg | Freq: Four times a day (QID) | RECTAL | Status: DC | PRN

## 2023-04-11 MED ORDER — SODIUM CHLORIDE 0.9% FLUSH
3.0000 mL | Freq: Two times a day (BID) | INTRAVENOUS | Status: DC
  Administered 2023-04-11 – 2023-04-15 (×9): 3 mL via INTRAVENOUS

## 2023-04-11 MED ORDER — SODIUM CHLORIDE 0.9% FLUSH: 3.0000 mL | INTRAVENOUS | Status: DC | PRN

## 2023-04-11 MED ORDER — SODIUM CHLORIDE 0.9 % IV SOLN
2.0000 g | Freq: Two times a day (BID) | INTRAVENOUS | Status: DC
  Administered 2023-04-11 – 2023-04-12 (×3): 2 g via INTRAVENOUS
  Filled 2023-04-11 (×3): qty 12.5

## 2023-04-11 MED ORDER — SODIUM CHLORIDE 0.9 % IV SOLN: 250.0000 mL | INTRAVENOUS | Status: DC | PRN

## 2023-04-11 NOTE — ED Notes (Signed)
Pt

## 2023-04-11 NOTE — Progress Notes (Signed)
Pharmacy Antibiotic Note  Gavin Dorsey is a 62 y.o. male admitted on 04/10/2023 with sepsis.  Pharmacy has been consulted for Vancomycin/Cefepime dosing. WBC elevated. Noted renal dysfunction.   Plan: Vancomycin 1000 mg IV q24h >>>Estimated AUC: 456 Cefepime 2g IV q12h Trend WBC, temp, renal function  F/U infectious work-up Drug levels as indicated   Height: 5\' 8"  (172.7 cm) Weight: 100.2 kg (221 lb) IBW/kg (Calculated) : 68.4  Temp (24hrs), Avg:99.1 F (37.3 C), Min:98.7 F (37.1 C), Max:99.4 F (37.4 C)  Recent Labs  Lab 04/05/23 0408 04/10/23 2114 04/11/23 0238  WBC 9.4 17.0*  --   CREATININE 1.61* 1.56*  --   LATICACIDVEN  --   --  1.8    Estimated Creatinine Clearance: 56.3 mL/min (A) (by C-G formula based on SCr of 1.56 mg/dL (H)).    No Known Allergies  Abran Duke, PharmD, BCPS Clinical Pharmacist Phone: [phone]

## 2023-04-11 NOTE — ED Notes (Signed)
ED TO INPATIENT HANDOFF REPORT  ED Nurse Name and Phone #: Darryll Capers [phone]  S Name/Age/Gender Gavin Dorsey 62 y.o. male Room/Bed: 006C/006C  Code Status   Code Status: Full Code  Home/SNF/Other Home Patient oriented to: self, place, time, and situation Is this baseline? Yes   Triage Complete: Triage complete  Chief Complaint GI bleed [K92.2]  Triage Note Pt was recently hospitalized for a stroke. Placed on brilenta for clots. Pt reports dark stools, blood in emesis, and feeling weak in the left arm all day today. Started before 12 noon.   Allergies No Known Allergies  Level of Care/Admitting Diagnosis ED Disposition     ED Disposition  Admit   Condition  --   Comment  Hospital Area: [facility] [[id number]]  Level of Care: Progressive [102]  Admit to Progressive based on following criteria: Other see comments  Comments: GI bleed  May admit patient to [facility] or [facility] if equivalent level of care is available:: No  Covid Evaluation: Asymptomatic - no recent exposure (last 10 days) testing not required  Diagnosis: GI bleed [191478]  Admitting Physician: Tereasa Coop [[id number]]  Attending Physician: Tereasa Coop [[id number]]  Certification:: I certify this patient will need inpatient services for at least 2 midnights  Expected Medical Readiness: 04/17/2023          B Medical/Surgery History Past Medical History:  Diagnosis Date   Diabetes mellitus without complication (HCC)    Hypertension    Past Surgical History:  Procedure Laterality Date   IR ANGIO INTRA EXTRACRAN SEL COM CAROTID INNOMINATE BILAT MOD SED  04/02/2023   IR ANGIO VERTEBRAL SEL SUBCLAVIAN INNOMINATE UNI L MOD SED  04/02/2023   IR ANGIO VERTEBRAL SEL VERTEBRAL UNI R MOD SED  04/02/2023   IR ANGIO VERTEBRAL SEL VERTEBRAL UNI R MOD SED  04/04/2023   IR CT HEAD LTD  04/04/2023   IR INTRA CRAN STENT  04/04/2023   IR US GUIDE VASC ACCESS RIGHT  04/02/2023   IR US  GUIDE VASC ACCESS RIGHT  04/04/2023   RADIOLOGY WITH ANESTHESIA N/A 04/04/2023   Procedure: basilar artery angioplasty;  Surgeon: Julieanne Cotton, MD;  Location: [facility] OR;  Service: Radiology;  Laterality: N/A;     A IV Location/Drains/Wounds Patient Lines/Drains/Airways Status     Active Line/Drains/Airways     Name Placement date Placement time Site Days   Peripheral IV 04/10/23 18 G 1" Left Antecubital 04/10/23  --  Antecubital  1   Peripheral IV 04/11/23 20 G 1" Right Antecubital 04/11/23  0013  Antecubital  less than 1            Intake/Output Last 24 hours  Intake/Output Summary (Last 24 hours) at 04/11/2023 2037 Last data filed at 04/11/2023 1328 Gross per 24 hour  Intake 5193.04 ml  Output 600 ml  Net 4593.04 ml    Labs/Imaging Results for orders placed or performed during the hospital encounter of 04/10/23 (from the past 48 hours)  CBC with Differential     Status: Abnormal   Collection Time: 04/10/23  9:14 PM  Result Value Ref Range   WBC 17.0 (H) 4.0 - 10.5 K/uL   RBC 4.31 4.22 - 5.81 MIL/uL   Hemoglobin 12.1 (L) 13.0 - 17.0 g/dL   HCT 69.6 (L) 29.5 - 28.4 %   MCV 85.6 80.0 - 100.0 fL   MCH 28.1 26.0 - 34.0 pg   MCHC 32.8 30.0 - 36.0 g/dL   RDW  16.4 (H) 11.5 - 15.5 %   Platelets 337 150 - 400 K/uL   nRBC 0.0 0.0 - 0.2 %   Neutrophils Relative % 76 %   Neutro Abs 13.0 (H) 1.7 - 7.7 K/uL   Lymphocytes Relative 12 %   Lymphs Abs 2.0 0.7 - 4.0 K/uL   Monocytes Relative 10 %   Monocytes Absolute 1.6 (H) 0.1 - 1.0 K/uL   Eosinophils Relative 1 %   Eosinophils Absolute 0.2 0.0 - 0.5 K/uL   Basophils Relative 0 %   Basophils Absolute 0.1 0.0 - 0.1 K/uL   Immature Granulocytes 1 %   Abs Immature Granulocytes 0.08 (H) 0.00 - 0.07 K/uL    Comment: Performed at [facility], [address] [place], [place] [address]  Comprehensive metabolic panel     Status: Abnormal   Collection Time: 04/10/23  9:14 PM  Result Value Ref Range   Sodium 137 135 - 145  mmol/L   Potassium 4.4 3.5 - 5.1 mmol/L   Chloride 106 98 - 111 mmol/L   CO2 21 (L) 22 - 32 mmol/L   Glucose, Bld 158 (H) 70 - 99 mg/dL    Comment: Glucose reference range applies only to samples taken after fasting for at least 8 hours.   BUN 71 (H) 8 - 23 mg/dL   Creatinine, Ser 6.21 (H) 0.61 - 1.24 mg/dL   Calcium 8.8 (L) 8.9 - 10.3 mg/dL   Total Protein 6.4 (L) 6.5 - 8.1 g/dL   Albumin 3.0 (L) 3.5 - 5.0 g/dL   AST 21 15 - 41 U/L   ALT 21 0 - 44 U/L   Alkaline Phosphatase 55 38 - 126 U/L   Total Bilirubin 0.6 <1.2 mg/dL   GFR, Estimated 50 (L) >60 mL/min    Comment: (NOTE) Calculated using the CKD-EPI Creatinine Equation (2021)    Anion gap 10 5 - 15    Comment: Performed at [facility], [address] [place], [place] [address]  Lipase, blood     Status: None   Collection Time: 04/10/23  9:14 PM  Result Value Ref Range   Lipase 29 11 - 51 U/L    Comment: Performed at [facility], [address], [place], [place] [address]  Urinalysis, Routine w reflex microscopic -Urine, Clean Catch     Status: Abnormal   Collection Time: 04/10/23  9:48 PM  Result Value Ref Range   Color, Urine YELLOW YELLOW   APPearance HAZY (A) CLEAR   Specific Gravity, Urine 1.025 1.005 - 1.030   pH 5.0 5.0 - 8.0   Glucose, UA NEGATIVE NEGATIVE mg/dL   Hgb urine dipstick SMALL (A) NEGATIVE   Bilirubin Urine NEGATIVE NEGATIVE   Ketones, ur 5 (A) NEGATIVE mg/dL   Protein, ur NEGATIVE NEGATIVE mg/dL   Nitrite NEGATIVE NEGATIVE   Leukocytes,Ua LARGE (A) NEGATIVE   RBC / HPF 0-5 0 - 5 RBC/hpf   WBC, UA 0-5 0 - 5 WBC/hpf   Bacteria, UA RARE (A) NONE SEEN   Squamous Epithelial / HPF 0-5 0 - 5 /HPF   Mucus PRESENT     Comment: Performed at [facility], [address], [place], [place] [address]  Culture, blood (single)     Status: None (Preliminary result)   Collection Time: 04/10/23 11:08 PM   Specimen: BLOOD  Result Value Ref Range   Specimen Description BLOOD RIGHT ANTECUBITAL     Special Requests  BOTTLES DRAWN AEROBIC AND ANAEROBIC Blood Culture results may not be optimal due to an inadequate volume of blood received in culture bottles   Culture      NO GROWTH < 12 HOURS Performed at [facility], [address], [place], [place] [address]    Report Status PENDING   Resp panel by RT-PCR (RSV, Flu A&B, Covid) Anterior Nasal Swab     Status: None   Collection Time: 04/10/23 11:17 PM   Specimen: Anterior Nasal Swab  Result Value Ref Range   SARS Coronavirus 2 by RT PCR NEGATIVE NEGATIVE   Influenza A by PCR NEGATIVE NEGATIVE   Influenza B by PCR NEGATIVE NEGATIVE    Comment: (NOTE) The Xpert Xpress SARS-CoV-2/FLU/RSV plus assay is intended as an aid in the diagnosis of influenza from Nasopharyngeal swab specimens and should not be used as a sole basis for treatment. Nasal washings and aspirates are unacceptable for Xpert Xpress SARS-CoV-2/FLU/RSV testing.  Fact Sheet for Patients: BloggerCourse.com  Fact Sheet for Healthcare Providers: SeriousBroker.it  This test is not yet approved or cleared by the [place] FDA and has been authorized for detection and/or diagnosis of SARS-CoV-2 by FDA under an Emergency Use Authorization (EUA). This EUA will remain in effect (meaning this test can be used) for the duration of the COVID-19 declaration under Section 564(b)(1) of the Act, 21 U.S.C. section 360bbb-3(b)(1), unless the authorization is terminated or revoked.     Resp Syncytial Virus by PCR NEGATIVE NEGATIVE    Comment: (NOTE) Fact Sheet for Patients: BloggerCourse.com  Fact Sheet for Healthcare Providers: SeriousBroker.it  This test is not yet approved or cleared by the [place] FDA and has been authorized for detection and/or diagnosis of SARS-CoV-2 by FDA under an Emergency Use Authorization (EUA). This EUA will remain in effect  (meaning this test can be used) for the duration of the COVID-19 declaration under Section 564(b)(1) of the Act, 21 U.S.C. section 360bbb-3(b)(1), unless the authorization is terminated or revoked.  Performed at [facility], [address], [place], [place] [address]   POC occult blood, ED     Status: Abnormal   Collection Time: 04/10/23 11:28 PM  Result Value Ref Range   Fecal Occult Bld POSITIVE (A) NEGATIVE  I-Stat CG4 Lactic Acid     Status: None   Collection Time: 04/11/23  2:38 AM  Result Value Ref Range   Lactic Acid, Venous 1.8 0.5 - 1.9 mmol/L  Type and screen [facility]     Status: None   Collection Time: 04/11/23  4:34 AM  Result Value Ref Range   ABO/RH(D) O NEG    Antibody Screen NEG    Sample Expiration 04/14/2023,2359    Unit Number [id number]    Blood Component Type RED CELLS,LR    Unit division 00    Status of Unit DISCARDED    Transfusion Status OK TO TRANSFUSE    Crossmatch Result      Compatible Performed at [facility], [address], [place], [place] [address]   I-Stat CG4 Lactic Acid     Status: None   Collection Time: 04/11/23  4:37 AM  Result Value Ref Range   Lactic Acid, Venous 1.7 0.5 - 1.9 mmol/L  Prepare RBC (crossmatch)     Status: None   Collection Time: 04/11/23  4:55 AM  Result Value Ref Range   Order Confirmation      ORDER PROCESSED BY BLOOD BANK Performed at  [facility], [address], [place], [place] [address]   CBG monitoring, ED     Status: None   Collection Time: 04/11/23  1:52 PM  Result Value Ref Range   Glucose-Capillary 85 70 - 99 mg/dL    Comment: Glucose reference range applies only to samples taken after fasting for at least 8 hours.  Comprehensive metabolic panel     Status: Abnormal   Collection Time: 04/11/23  3:17 PM  Result Value Ref Range   Sodium 136 135 - 145 mmol/L   Potassium 4.2 3.5 - 5.1 mmol/L   Chloride 104 98 - 111 mmol/L   CO2 23 22 - 32 mmol/L   Glucose,  Bld 112 (H) 70 - 99 mg/dL    Comment: Glucose reference range applies only to samples taken after fasting for at least 8 hours.   BUN 60 (H) 8 - 23 mg/dL   Creatinine, Ser 5.46 (H) 0.61 - 1.24 mg/dL   Calcium 8.5 (L) 8.9 - 10.3 mg/dL   Total Protein 6.2 (L) 6.5 - 8.1 g/dL   Albumin 2.9 (L) 3.5 - 5.0 g/dL   AST 21 15 - 41 U/L   ALT 19 0 - 44 U/L   Alkaline Phosphatase 57 38 - 126 U/L   Total Bilirubin 0.7 <1.2 mg/dL   GFR, Estimated 58 (L) >60 mL/min    Comment: (NOTE) Calculated using the CKD-EPI Creatinine Equation (2021)    Anion gap 9 5 - 15    Comment: Performed at [facility], [address] [place], [place] [address]  CBC     Status: Abnormal   Collection Time: 04/11/23  5:08 PM  Result Value Ref Range   WBC 10.6 (H) 4.0 - 10.5 K/uL   RBC 2.91 (L) 4.22 - 5.81 MIL/uL   Hemoglobin 8.3 (L) 13.0 - 17.0 g/dL    Comment: REPEATED TO VERIFY   HCT 25.4 (L) 39.0 - 52.0 %   MCV 87.3 80.0 - 100.0 fL   MCH 28.5 26.0 - 34.0 pg   MCHC 32.7 30.0 - 36.0 g/dL   RDW 00.9 (H) 38.1 - 82.9 %   Platelets 222 150 - 400 K/uL   nRBC 0.0 0.0 - 0.2 %    Comment: Performed at [facility], [address], [place], [place] [address]  CBG monitoring, ED     Status: None   Collection Time: 04/11/23  7:00 PM  Result Value Ref Range   Glucose-Capillary 78 70 - 99 mg/dL    Comment: Glucose reference range applies only to samples taken after fasting for at least 8 hours.   CT HEAD WO CONTRAST ( ) Result Date: 04/11/2023 CLINICAL DATA:  Provided history: Weakness. EXAM: CT HEAD WITHOUT CONTRAST TECHNIQUE: Contiguous axial images were obtained from the base of the skull through the vertex without intravenous contrast. RADIATION DOSE REDUCTION: This exam was performed according to the departmental dose-optimization program which includes automated exposure control, adjustment of the mA and/or kV according to patient size and/or use of iterative reconstruction technique. COMPARISON:  Brain MRI  04/11/2023. Non-contrast head CT and CT angiogram head/neck 04/01/2023. FINDINGS: Brain: Mild generalized cerebral atrophy. Known 8 mm acute parenchymal hemorrhage at the right thalamocapsular junction/right cerebral peduncle. Mild surrounding edema, unchanged. Known acute/subacute infarcts within the brainstem, left middle cerebellar peduncle and left cerebellar hemisphere, occult by CT and better appreciated on the brain MRI performed earlier today. Known small chronic cortical infarct within the left occipital lobe. Patchy  and ill-defined hypoattenuation within the cerebral white matter, nonspecific but compatible with mild chronic small vessel ischemic disease. No extra-axial fluid collection. No evidence of an intracranial mass. No midline shift. Vascular: No hyperdense vessel. Vascular stent within the basilar artery. Atherosclerotic calcifications. Skull: No calvarial fracture or aggressive osseous lesion. Sinuses/Orbits: No mass or acute finding within the imaged orbits. Minimal mucosal thickening scattered within the paranasal sinuses. IMPRESSION: 1. Known 8 mm acute parenchymal hemorrhage at the right thalamocapsular junction/right cerebral peduncle. Mild surrounding edema, unchanged. 2. Known acute/subacute infarcts within the brainstem, left middle cerebellar peduncle and left cerebellar hemisphere, occult by CT and better appreciated on the brain MRI performed earlier today. 3. 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 Electronically Signed   By: Jackey Loge D.O.   On: 04/11/2023 09:01   MR BRAIN WO CONTRAST Addendum Date: 04/11/2023 ADDENDUM REPORT: 04/11/2023 06:47 ADDENDUM: MRI and MRA findings were discussed by telephone with Dr. Tereasa Coop on 04/11/2023 7829 hours. Electronically Signed   By: Odessa Fleming M.D.   On: 04/11/2023 06:47   Result Date: 04/11/2023 CLINICAL DATA:  62 year old male with scattered  brainstem and left cerebellar hemisphere infarcts earlier this month, left vertebral artery occlusion, severe basilar artery stenosis/pre-occlusion status post stent assisted Basilar angioplasty on 04/04/2023. Neurologic deficit. EXAM: MRI HEAD WITHOUT CONTRAST TECHNIQUE: Multiplanar, multiecho pulse sequences of the brain and surrounding structures were obtained without intravenous contrast. COMPARISON:  Brain MRI 04/01/2023. Intracranial MRA today reported separately. FINDINGS: Brain: Expected evolution of scattered, patchy brainstem and left cerebellar hemisphere infarcts since 04/01/2023. New DWI susceptibility at the right cerebral peduncle with surrounding patchy T2 and FLAIR hyperintensity (series 7, image 17, series 8, image 45) compatible with intra-axial hemorrhage estimated at about 8 mm diameter. No significant mass effect. No other new diffusion abnormality when compared to 04/01/2023. No other intracranial hemorrhage identified. No IVH or ventriculomegaly. Stable other supratentorial gray and white matter signal, widely scattered nonspecific white matter T2 and FLAIR hyperintensity. An small area of left superior occipital pole encephalomalacia. No midline shift or intracranial mass effect. Basilar cisterns remain normal. Negative pituitary and cervicomedullary junction. Vascular: Continued absence of the distal left vertebral artery flow void. But improved basilar artery flow void since 04/01/2023. Other Major intracranial vascular flow voids are preserved. Skull and upper cervical spine: Stable, negative. Sinuses/Orbits: Stable, negative. Other: Mastoids remain well aerated. Grossly normal visible internal auditory structures. Stable visible scalp and face. IMPRESSION: 1. MRI evidence of acute intra-axial hemorrhage at the right cerebral peduncle, proximally 8 mm. Mild surrounding edema. No significant mass effect. No intraventricular or extra-axial extension identified. 2. Otherwise expected  evolution of brainstem and left cerebellar infarcts since 04/01/2023. No other acute intracranial abnormality. 3. Improved basilar artery flow void since 04/01/2023, evidence of continued occlusion distal left vertebral artery. See MRA today reported separately. Electronically Signed: By: Odessa Fleming M.D. On: 04/11/2023 06:21   MR ANGIO HEAD WO CONTRAST Result Date: 04/11/2023 CLINICAL DATA:  62 year old male with scattered brainstem and left cerebellar hemisphere infarcts earlier this month, left vertebral artery occlusion, severe basilar artery stenosis/pre-occlusion status post stent assisted Basilar angioplasty on 04/04/2023. Neurologic deficit. EXAM: MRA HEAD WITHOUT CONTRAST TECHNIQUE: Angiographic images of the Circle of Willis were acquired using MRA technique without intravenous contrast. COMPARISON:  Brain MRI today. Pretreatment CTA head and neck 04/01/2023. FINDINGS: Anterior circulation: Tortuous distal cervical ICAs and antegrade flow signal throughout both ICA siphons. Ophthalmic artery origins appear  normal. Mild to moderate narrowing of the supraclinoid ICAs appears stable. Patent carotid termini, MCA and ACA origins. Visible ACA and MCA branches appears stable. Posterior circulation: No distal left vertebral artery antegrade flow signal. Antegrade flow in the distal right vertebral artery appears stable to the vertebrobasilar junction. Right PICA origin is patent. New proximal basilar artery susceptibility artifact compatible with new vascular stent (series 3, image 37) with maintained antegrade flow in the distal basilar artery indicating stent patency. SCA and PCA origins are patent. Posterior communicating arteries are diminutive or absent. Bilateral PCA branches appear improved since 04/01/2023. Anatomic variants: None significant. Other: Subtle new susceptibility artifact at the right midbrain, cerebral peduncle. See MRI today reported separately. IMPRESSION: 1. Unchanged distal left vertebral  artery occlusion. Distal right vertebral artery remains patent. Patent proximal Basilar artery stent and improved distal Basilar, bilateral SCA and PCA flow related enhancement since 03/31/2061. 2. Stable anterior circulation, mild to moderate bilateral supraclinoid ICA atherosclerotic stenosis. 3. Right cerebral peduncle hemorrhage on MRI today is reported separately. Electronically Signed   By: Odessa Fleming M.D.   On: 04/11/2023 06:26   CT CHEST ABDOMEN PELVIS W CONTRAST Result Date: 04/11/2023 CLINICAL DATA:  Dark stools, hematemesis and generalized weakness left upper extremity. Being evaluated for sepsis. Checking for source. 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 EXAM: CT CHEST, ABDOMEN, AND PELVIS WITH CONTRAST TECHNIQUE: Multidetector CT imaging of the chest, abdomen and pelvis was performed following the standard protocol during bolus administration of intravenous contrast. RADIATION DOSE REDUCTION: This exam was performed according to the departmental dose-optimization program which includes automated exposure control, adjustment of the mA and/or kV according to patient size and/or use of iterative reconstruction technique. CONTRAST:  75mL OMNIPAQUE IOHEXOL 350 MG/ML SOLN COMPARISON:  No recent chest x-ray. Comparison is made with the abdomen and pelvis CT with contrast 11/12/2021 and a chest CT with contrast 09/11/2006. FINDINGS: CT CHEST FINDINGS Cardiovascular: The cardiac size is normal. There is no pericardial effusion. The pulmonary arteries and veins are normal in caliber. The pulmonary arteries are centrally clear. Small amount of scattered aortic and great vessel atherosclerosis. There is no stenosis or dissection. The aortic root measures 4.0 cm at the sinuses of Valsalva on 6:107. The mid ascending segment 4.1 cm on 6:111. Annual follow-up imaging recommended. Mediastinum/Nodes: No enlarged mediastinal, hilar, or axillary lymph  nodes. The lower poles of the thyroid gland, trachea, and esophagus demonstrate no significant findings. Lungs/Pleura: There is diffuse bronchial thickening. There is no consolidation, effusion, pneumothorax or visible pulmonary nodule. 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 No acute or other significant osseous findings. Unremarkable chest wall. CT ABDOMEN PELVIS FINDINGS Hepatobiliary: No focal liver abnormality is seen. No calcified gallstones, gallbladder wall thickening, or biliary dilatation. Pancreas: Questionable haziness at the pancreatic tail versus artifact due to beam hardening from the patient's arms in the field. Correlate with serum lipase for possible significance. There is no pancreatic mass enhancement or ductal dilatation, no other focal abnormality. Spleen: No abnormality. Adrenals/Urinary Tract: Adrenal glands are unremarkable. Kidneys are normal, without renal calculi, focal lesion, or hydronephrosis. Bladder is unremarkable. Stomach/Bowel: There is fluid filling of the nondilated stomach. Normal wall thickness. The unopacified small bowel is normal caliber without mesenteric inflammatory change. The appendix is normal. There is scattered colonic diverticulosis without evidence of diverticulitis. Vascular/Lymphatic: Aortic atherosclerosis. No enlarged abdominal or pelvic lymph nodes. Reproductive: There is mild prostatomegaly, stable.  Other: No abdominal wall hernia or abnormality. No abdominopelvic ascites. There is no free hemorrhage or free air. Musculoskeletal: Facet hypertrophy and spondylosis lumbar spine. No acute or other significant osseous findings. Mild hip DJD. Moderate acquired spinal stenosis L4-5. IMPRESSION: 1. Questionable haziness at the pancreatic tail versus artifact due to beam hardening from the patient's arms in the field. Correlate with serum lipase for possible significance. 2. Bronchitis without evidence of  pneumonia. 3. Aortic atherosclerosis. 4. 4.0 cm aortic root and 4.1 cm mid ascending segment. Recommend annual imaging followup by CTA or MRA. This recommendation follows 2010 ACCF/AHA/AATS/ACR/ASA/ SCA/SCAI/SIR/STS/SVM Guidelines for the Diagnosis and Management of Patients with Thoracic Aortic Disease. Circulation. 2010; 121: Z610-R604. Aortic aneurysm NOS (ICD10-I71.9). 5. Diverticulosis without evidence of diverticulitis. 6. Mild prostatomegaly. 7. Moderate acquired spinal stenosis L4-5. Aortic Atherosclerosis (ICD10-I70.0). Electronically Signed   By: Almira Bar M.D.   On: 04/11/2023 01:47    Pending Labs Unresulted Labs (From admission, onward)     Start     Ordered   04/12/23 0800  Hemoglobin and hematocrit, blood  3 times daily,   R (with TIMED occurrences)      04/11/23 1836   04/12/23 0500  CBC  Tomorrow morning,   R       Question:  Specimen collection method  Answer:  Lab=Lab collect   04/11/23 1835   04/12/23 0500  Comprehensive metabolic panel  Tomorrow morning,   R       Question:  Specimen collection method  Answer:  Lab=Lab collect   04/11/23 1835   04/11/23 0727  MRSA Next Gen by PCR, Nasal  (MRSA Screening)  Once,   R        04/11/23 0726   04/11/23 0356  Gastrointestinal Panel by PCR , Stool  (Gastrointestinal Panel by PCR, Stool                                                                                                                                                     **Does Not include CLOSTRIDIUM DIFFICILE testing. **If CDIFF testing is needed, place order from the "C Difficile Testing" order set.**)  Once,   R        04/11/23 0355   04/11/23 0356  C Difficile Quick Screen w PCR reflex  (C Difficile quick screen w PCR reflex panel )  Once, for 24 hours,   TIMED       References:    CDiff Information Tool   04/11/23 0355            Vitals/Pain Today's Vitals   04/11/23 1903 04/11/23 1930 04/11/23 1954 04/11/23 1954  BP:  117/74    Pulse:      Resp:   19    Temp: 97.9 F (36.6 C)  98.2 F (36.8 C)   TempSrc: Oral  Oral   SpO2:      Weight:      Height:      PainSc:    0-No pain    Isolation Precautions Enteric precautions (UV disinfection)  Medications Medications  lactated ringers infusion ( Intravenous Infusion Verify 04/11/23 1328)  rosuvastatin (CRESTOR) tablet 40 mg (40 mg Oral Given 04/11/23 0920)  sodium chloride flush (NS) 0.9 % injection 3 mL (3 mLs Intravenous Not Given 04/11/23 0903)  metroNIDAZOLE (FLAGYL) IVPB 500 mg (0 mg Intravenous Stopped 04/11/23 1328)  acetaminophen (TYLENOL) tablet 650 mg (has no administration in time range)    Or  acetaminophen (TYLENOL) suppository 650 mg (has no administration in time range)  sodium chloride flush (NS) 0.9 % injection 3 mL (3 mLs Intravenous Not Given 04/11/23 0903)  sodium chloride flush (NS) 0.9 % injection 3 mL (has no administration in time range)  0.9 %  sodium chloride infusion (has no administration in time range)  ondansetron (ZOFRAN) tablet 4 mg (has no administration in time range)    Or  ondansetron (ZOFRAN) injection 4 mg (has no administration in time range)  0.9 %  sodium chloride infusion (Manually program via Guardrails IV Fluids) ( Intravenous Not Given 04/11/23 0708)  pantoprazole (PROTONIX) injection 40 mg (40 mg Intravenous Given 04/11/23 0920)  ceFEPIme (MAXIPIME) 2 g in sodium chloride 0.9 % 100 mL IVPB (0 g Intravenous Stopped 04/11/23 1022)  vancomycin (VANCOCIN) IVPB 1000 mg/200 mL premix (has no administration in time range)  aspirin EC tablet 81 mg (81 mg Oral Given 04/11/23 0920)  lactated ringers bolus 1,000 mL (0 mLs Intravenous Stopped 04/11/23 0140)    And  lactated ringers bolus 1,000 mL (0 mLs Intravenous Stopped 04/11/23 0140)    And  lactated ringers bolus 1,000 mL (0 mLs Intravenous Stopped 04/11/23 0325)    And  lactated ringers bolus 500 mL (0 mLs Intravenous Stopped 04/11/23 0324)  ceFEPIme (MAXIPIME) 2 g in sodium chloride  0.9 % 100 mL IVPB (0 g Intravenous Stopped 04/11/23 0140)  metroNIDAZOLE (FLAGYL) IVPB 500 mg (0 mg Intravenous Stopped 04/11/23 0141)  pantoprazole (PROTONIX) injection 40 mg (40 mg Intravenous Given 04/11/23 0136)  vancomycin (VANCOCIN) IVPB 1000 mg/200 mL premix (0 mg Intravenous Stopped 04/11/23 0149)    Followed by  vancomycin (VANCOCIN) IVPB 1000 mg/200 mL premix (0 mg Intravenous Stopped 04/11/23 0325)  iohexol (OMNIPAQUE) 350 MG/ML injection 75 mL (75 mLs Intravenous Contrast Given 04/11/23 0108)    Mobility walks     Focused Assessments Neuro Assessment Handoff:  Swallow screen pass? Yes    NIH Stroke Scale  Dizziness Present: No Headache Present: No Interval: Initial Level of Consciousness (1a.)   : Alert, keenly responsive LOC Questions (1b. )   : Answers both questions correctly LOC Commands (1c. )   : Performs both tasks correctly Best Gaze (2. )  : Normal Visual (3. )  : No visual loss Facial Palsy (4. )    : Normal symmetrical movements Motor Arm, Left (5a. )   : No drift Motor Arm, Right (5b. ) : No drift Motor Leg, Left (6a. )  : No drift Motor Leg, Right (6b. ) : No drift Limb Ataxia (7. ): Absent Sensory (8. )  : Normal, no sensory loss Best Language (9. )  : No aphasia Dysarthria (10. ): Normal Extinction/Inattention (11.)   : No Abnormality Complete NIHSS TOTAL: 0 Last date known well: 04/10/23 Last time known well: 0700 Neuro Assessment: Within Defined Limits Neuro  Checks:   Initial (04/11/23 1340)  Has TPA been given? Yes Temp: 98.2 F (36.8 C) (12/27 1954) Temp Source: Oral (12/27 1954) BP: 117/74 (12/27 1930) Pulse Rate: 77 (12/27 1845) If patient is a Neuro Trauma and patient is going to OR before floor call report to 4N Charge nurse: [phone] or [phone]   R Recommendations: See Admitting Provider Note  Report given to:   Additional Notes: had friends at bedside

## 2023-04-11 NOTE — H&P (Addendum)
History and Physical    Gavin Dorsey ZOX:[id number] DOB: Jun 17, 1960 DOA: 04/10/2023  PCP: Simonne Martinet, MD   Patient coming from: Home   Chief Complaint:  Chief Complaint  Patient presents with   Weakness   ED TRIAGE note: Pt was recently hospitalized for a stroke. Placed on brilenta for clots. Pt reports dark stools, blood in emesis, and feeling weak in the left arm all day today. Started before 12 noon.            HPI:  Gavin Dorsey is a 62 y.o. 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 Patient stated that he began taking Brilinta at time of the discharge 2 days ago. During my evaluation at the bedside patient reported that he has 3-4 episodes of loose stool in last 24 hours and also noticed black-colored stool for 3 to episodes as well.  Denies any bright red blood per rectum.  Patient denies any abdominal cramping and pain.  Denies any nausea, vomiting, fever and chill. Patient reported that since his last stroke he is still having some weakness of the left upper extremity but denies any generalized weakness, dysarthria and dysphagia. Patient denies any chest pain and shortness of breath.  ED Course:  At presentation to ED patient found tachycardic heart rate 125 which improved to 88, initially hypotensive blood pressure 89/66 which improved to 148/89, tachypnea 26 and O2 sat 100% room air.  CMP showed low bicarb 21, renal function at baseline creatinine 1.5 and GFR 48, low albumin 3. UA hazy appearance, hemoglobin dipstick  positive, leukocyte esterase large and rare bacteria.  Pending urine cultures.  Pending blood cultures. Normal lipase 29. Respiratory panel negative. Pending FOBT.  CT chest abdomen pelvis with contrast: IMPRESSION: 1. Questionable haziness at the pancreatic tail versus artifact due to beam hardening from the patient's arms in the field. Correlate with serum lipase for possible significance. 2. Bronchitis without evidence of pneumonia. 3. Aortic atherosclerosis. 4. 4.0 cm aortic root and 4.1 cm mid ascending segment. Recommend annual imaging followup by CTA or MRA. This recommendation follows 2010 ACCF/AHA/AATS/ACR/ASA/ SCA/SCAI/SIR/STS/SVM Guidelines for the Diagnosis and Management of Patients with Thoracic Aortic Disease. Circulation. 2010; 121: W119-J478. Aortic aneurysm NOS (ICD10-I71.9). 5. Diverticulosis without evidence of diverticulitis. 6. Mild prostatomegaly. 7. Moderate acquired spinal stenosis L4-L5   With the concern for early development of sepsis in the setting of diarrhea in the ED patient has been started with broad-spectrum antibiotic IV vancomycin, metronidazole and cefepime.  Patient also resuscitated with 3.5 L of LR and can be on LR 150 cc/h.  Physician has been consulted GI Dr. Leonides Schanz in the setting of GI bleed and GI will see patient in the AM.  Requested ED physician to reach out to on-call neurology regarding dual antiplatelet therapy management (patient is on aspirin and Brilinta with recent basilar angioplasty)  Update, ED physician spoke with neurology Dr. Wilford Corner stated that it is not safe to hold Brilinta as patient just had a stent placement 1 week ago however it is okay to hold until GI evaluation will be done and recommended to obtain an MRI of brain to make sure there  is no recurrent stroke.  Hospitalist has been contacted further management of GI bleed, sepsis and ongoing diarrhea.   Significant labs in the ED: Lab Orders         Culture, blood  (single)         Resp panel by RT-PCR (RSV, Flu A&B, Covid) Anterior Nasal Swab         Gastrointestinal Panel by PCR , Stool         C Difficile Quick Screen w PCR reflex         CBC with Differential         Comprehensive metabolic panel         Lipase, blood         Urinalysis, Routine w reflex microscopic -Urine, Clean Catch         CBC         Hemoglobin and hematocrit, blood         Comprehensive metabolic panel         POC occult blood, ED         I-Stat CG4 Lactic Acid       Review of Systems:  Review of Systems  Constitutional:  Negative for chills, fever and weight loss.  Respiratory:  Negative for cough and shortness of breath.   Cardiovascular:  Negative for chest pain and leg swelling.  Gastrointestinal:  Positive for diarrhea and melena. Negative for abdominal pain, blood in stool, constipation, heartburn, nausea and vomiting.  Genitourinary:  Negative for dysuria, frequency and urgency.  Musculoskeletal:  Negative for back pain, falls, joint pain, myalgias and neck pain.  Neurological:  Negative for dizziness and headaches.  Psychiatric/Behavioral:  The patient is not nervous/anxious.     Past Medical History:  Diagnosis Date   Diabetes mellitus without complication (HCC)    Hypertension     Past Surgical History:  Procedure Laterality Date   IR ANGIO INTRA EXTRACRAN SEL COM CAROTID INNOMINATE BILAT MOD SED  04/02/2023   IR ANGIO VERTEBRAL SEL SUBCLAVIAN INNOMINATE UNI L MOD SED  04/02/2023   IR ANGIO VERTEBRAL SEL VERTEBRAL UNI R MOD SED  04/02/2023   IR ANGIO VERTEBRAL SEL VERTEBRAL UNI R MOD SED  04/04/2023   IR CT HEAD LTD  04/04/2023   IR INTRA CRAN STENT  04/04/2023   IR US GUIDE VASC ACCESS RIGHT  04/02/2023   IR US GUIDE VASC ACCESS RIGHT  04/04/2023   RADIOLOGY WITH ANESTHESIA N/A 04/04/2023   Procedure: basilar artery angioplasty;  Surgeon: Julieanne Cotton, MD;  Location: [facility] OR;  Service: Radiology;  Laterality: N/A;     reports that he has  never smoked. He has never used smokeless tobacco. He reports current alcohol use. He reports that he does not use drugs.  No Known Allergies  History reviewed. No pertinent family history.  Prior to Admission medications   Medication Sig Start Date End Date Taking? Authorizing Provider  AMBULATORY NON FORMULARY MEDICATION Testosterone Cream 20%  Apply 0.5cc daily  Quantity: 30ml Refill 5  [facility] [address]. Jerlyn Ly [phone] 08/19/22   Riki Altes, MD  aspirin 81 MG chewable tablet Chew 1 tablet (81 mg total) by mouth daily. 04/07/23   de Saintclair Halsted, Cortney E, NP  diazepam (VALIUM) 5 MG tablet Take 5 mg by mouth 2 (two) times daily as needed for anxiety.    [provider]  rosuvastatin (CRESTOR) 40 MG tablet Take 1 tablet (40 mg total)  by mouth daily. 04/07/23   de Saintclair Halsted, Cortney E, NP  tadalafil (CIALIS) 5 MG tablet Take 1 tablet (5 mg total) by mouth daily as needed for erectile dysfunction. 01/02/23   Stoioff, Verna Czech, MD  ticagrelor (BRILINTA) 90 MG TABS tablet Take 1 tablet (90 mg total) by mouth 2 (two) times daily. 04/06/23   de Saintclair Halsted, Cortney E, NP  ticagrelor (BRILINTA) 90 MG TABS tablet Take 1 tablet (90 mg total) by mouth 2 (two) times daily. 04/07/23   de Verdis Prime, NP     Physical Exam: Vitals:   04/11/23 0145 04/11/23 0300 04/11/23 0430 04/11/23 0500  BP: 133/74 (!) 148/89 133/80 129/81  Pulse: 95 88 89 89  Resp: (!) 29 (!) 26 (!) 26 (!) 22  Temp:      TempSrc:      SpO2: 100% 100% 100% 98%  Weight:      Height:        Physical Exam Constitutional:      General: He is not in acute distress.    Appearance: He is obese. He is not ill-appearing.  HENT:     Mouth/Throat:     Mouth: Mucous membranes are moist.  Eyes:     Conjunctiva/sclera: Conjunctivae normal.  Cardiovascular:     Rate and Rhythm: Normal rate and regular rhythm.     Pulses: Normal pulses.     Heart sounds:  Normal heart sounds.  Pulmonary:     Effort: Pulmonary effort is normal.     Breath sounds: Normal breath sounds.  Abdominal:     General: Bowel sounds are normal. There is no distension.     Tenderness: There is no abdominal tenderness. There is no guarding.  Musculoskeletal:     Cervical back: Neck supple.     Right lower leg: No edema.     Left lower leg: No edema.  Skin:    Capillary Refill: Capillary refill takes less than 2 seconds.     Coloration: Skin is not pale.  Neurological:     Mental Status: He is alert and oriented to person, place, and time.     Cranial Nerves: No cranial nerve deficit.     Sensory: No sensory deficit.     Motor: No weakness.  Psychiatric:        Mood and Affect: Mood normal.        Thought Content: Thought content normal.      Labs on Admission: I have personally reviewed following labs and imaging studies  CBC: Recent Labs  Lab 04/05/23 0408 04/10/23 2114  WBC 9.4 17.0*  NEUTROABS 8.0* 13.0*  HGB 15.5 12.1*  HCT 46.4 36.9*  MCV 85.0 85.6  PLT 218 337   Basic Metabolic Panel: Recent Labs  Lab 04/05/23 0408 04/10/23 2114  NA 135 137  K 4.0 4.4  CL 102 106  CO2 26 21*  GLUCOSE 178* 158*  BUN 16 71*  CREATININE 1.61* 1.56*  CALCIUM 8.3* 8.8*   GFR: Estimated Creatinine Clearance: 56.3 mL/min (A) (by C-G formula based on SCr of 1.56 mg/dL (H)). Liver Function Tests: Recent Labs  Lab 04/10/23 2114  AST 21  ALT 21  ALKPHOS 55  BILITOT 0.6  PROT 6.4*  ALBUMIN 3.0*   Recent Labs  Lab 04/10/23 2114  LIPASE 29   No results for input(s): "AMMONIA" in the last 168 hours. Coagulation Profile: No results for input(s): "INR", "PROTIME" in the last 168 hours. Cardiac Enzymes: No  results for input(s): "CKTOTAL", "CKMB", "CKMBINDEX", "TROPONINI", "TROPONINIHS" in the last 168 hours. BNP (last 3 results) No results for input(s): "BNP" in the last 8760 hours. HbA1C: No results for input(s): "HGBA1C" in the last 72  hours. CBG: Recent Labs  Lab 04/04/23 1807  GLUCAP 102*   Lipid Profile: No results for input(s): "CHOL", "HDL", "LDLCALC", "TRIG", "CHOLHDL", "LDLDIRECT" in the last 72 hours. Thyroid Function Tests: No results for input(s): "TSH", "T4TOTAL", "FREET4", "T3FREE", "THYROIDAB" in the last 72 hours. Anemia Panel: No results for input(s): "VITAMINB12", "FOLATE", "FERRITIN", "TIBC", "IRON", "RETICCTPCT" in the last 72 hours. Urine analysis:    Component Value Date/Time   COLORURINE YELLOW 04/10/2023 2148   APPEARANCEUR HAZY (A) 04/10/2023 2148   APPEARANCEUR Hazy 07/27/2011 1426   LABSPEC 1.025 04/10/2023 2148   LABSPEC 1.027 07/27/2011 1426   PHURINE 5.0 04/10/2023 2148   GLUCOSEU NEGATIVE 04/10/2023 2148   GLUCOSEU Negative 07/27/2011 1426   HGBUR SMALL (A) 04/10/2023 2148   BILIRUBINUR NEGATIVE 04/10/2023 2148   BILIRUBINUR Negative 07/27/2011 1426   KETONESUR 5 (A) 04/10/2023 2148   PROTEINUR NEGATIVE 04/10/2023 2148   NITRITE NEGATIVE 04/10/2023 2148   LEUKOCYTESUR LARGE (A) 04/10/2023 2148   LEUKOCYTESUR Negative 07/27/2011 1426    Radiological Exams on Admission: I have personally reviewed images CT CHEST ABDOMEN PELVIS W CONTRAST Result Date: 04/11/2023 CLINICAL DATA:  Dark stools, hematemesis and generalized weakness left upper extremity. Being evaluated for sepsis. Checking for source. 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 EXAM: CT CHEST, ABDOMEN, AND PELVIS WITH CONTRAST TECHNIQUE: Multidetector CT imaging of the chest, abdomen and pelvis was performed following the standard protocol during bolus administration of intravenous contrast. RADIATION DOSE REDUCTION: This exam was performed according to the departmental dose-optimization program which includes automated exposure control, adjustment of the mA and/or kV according to patient size and/or use of iterative reconstruction technique. CONTRAST:  75mL  OMNIPAQUE IOHEXOL 350 MG/ML SOLN COMPARISON:  No recent chest x-ray. Comparison is made with the abdomen and pelvis CT with contrast 11/12/2021 and a chest CT with contrast 09/11/2006. FINDINGS: CT CHEST FINDINGS Cardiovascular: The cardiac size is normal. There is no pericardial effusion. The pulmonary arteries and veins are normal in caliber. The pulmonary arteries are centrally clear. Small amount of scattered aortic and great vessel atherosclerosis. There is no stenosis or dissection. The aortic root measures 4.0 cm at the sinuses of Valsalva on 6:107. The mid ascending segment 4.1 cm on 6:111. Annual follow-up imaging recommended. Mediastinum/Nodes: No enlarged mediastinal, hilar, or axillary lymph nodes. The lower poles of the thyroid gland, trachea, and esophagus demonstrate no significant findings. Lungs/Pleura: There is diffuse bronchial thickening. There is no consolidation, effusion, pneumothorax or visible pulmonary nodule. 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 No acute or other significant osseous findings. Unremarkable chest wall. CT ABDOMEN PELVIS FINDINGS Hepatobiliary: No focal liver abnormality is seen. No calcified gallstones, gallbladder wall thickening, or biliary dilatation. Pancreas: Questionable haziness at the pancreatic tail versus artifact due to beam hardening from the patient's arms in the field. Correlate with serum lipase for possible significance. There is no pancreatic mass enhancement or ductal dilatation, no other focal abnormality. Spleen: No abnormality. Adrenals/Urinary Tract: Adrenal glands are unremarkable. Kidneys are normal, without renal calculi, focal lesion, or hydronephrosis. Bladder is unremarkable. Stomach/Bowel: There is fluid filling of the nondilated stomach. Normal wall thickness. The unopacified small bowel is normal caliber without mesenteric inflammatory change.  The appendix is normal. There is scattered  colonic diverticulosis without evidence of diverticulitis. Vascular/Lymphatic: Aortic atherosclerosis. No enlarged abdominal or pelvic lymph nodes. Reproductive: There is mild prostatomegaly, stable. Other: No abdominal wall hernia or abnormality. No abdominopelvic ascites. There is no free hemorrhage or free air. Musculoskeletal: Facet hypertrophy and spondylosis lumbar spine. No acute or other significant osseous findings. Mild hip DJD. Moderate acquired spinal stenosis L4-5. IMPRESSION: 1. Questionable haziness at the pancreatic tail versus artifact due to beam hardening from the patient's arms in the field. Correlate with serum lipase for possible significance. 2. Bronchitis without evidence of pneumonia. 3. Aortic atherosclerosis. 4. 4.0 cm aortic root and 4.1 cm mid ascending segment. Recommend annual imaging followup by CTA or MRA. This recommendation follows 2010 ACCF/AHA/AATS/ACR/ASA/ SCA/SCAI/SIR/STS/SVM Guidelines for the Diagnosis and Management of Patients with Thoracic Aortic Disease. Circulation. 2010; 121: G956-O130. Aortic aneurysm NOS (ICD10-I71.9). 5. Diverticulosis without evidence of diverticulitis. 6. Mild prostatomegaly. 7. Moderate acquired spinal stenosis L4-5. Aortic Atherosclerosis (ICD10-I70.0). Electronically Signed   By: Almira Bar M.D.   On: 04/11/2023 01:47     EKG: My personal interpretation of EKG shows:  EKG shows sinus tachycardia with premature atrial complex heart rate 126.   Assessment/Plan: Principal Problem:   GI bleed Active Problems:   History of ischemic stroke 04/04/2023   Sepsis (HCC)   Acute cystitis   Chronic diastolic CHF (congestive heart failure) (HCC)   Diarrhea   Essential hypertension   Basilar artery stenosis s/p stent placement   Chronic kidney disease (CKD), stage II (mild)   Non-insulin dependent type 2 diabetes mellitus (HCC)   Hyperlipidemia    Assessment and Plan: GI bleed > Patient presenting today complaining of  black-colored tarry stool and diarrhea 3-4 episodes in last 24 hours.  Denies any bright red blood per rectum.  Patient denies any abdominal pain, nausea and vomiting.  Denies any history of previous GI bleed in the past - Presentation to ED patient found hypotensive, tachycardic and tachypneic.  Heart rate and blood pressure has been improved after 3.5 L of IV fluid resuscitation in the ED - Hemoglobin 12. 1.  Baseline hemoglobin around 16.8 9 days ago. -Due to recent episodes of ischemic CVA and basilar artery stent placement patient being has been started on aspirin and Brilinta 1 week ago.  Concern for GI bleed secondary to dual antiplatelet therapy -Obtaining type and screen and preparing 1 units of blood in case patient needs blood transfusion. -Starting Protonix 40 mg IV twice daily. -Monitor monitor H&H 3 times daily. - Transfuse as needed to keep hemoglobin above 8 -Physician consulted to speak with on-call [place] [facility] Dr. Leonides Schanz and will see patient in the a.m. -ED physician spoke with on-call neurology and Dr. Wilford Corner stated that it is not ideal to hold antiplatelet therapy as patient just have stent placement 1 week ago but it is okay to hold the Brilinta but continue the aspirin until GI evaluation will be completed.  Neurology recommended MRI of the brain and MRI head and neck to rule out any recurrent stroke and stent rethrombosis. Neurology recommended to restart Brilinta when medically possible. -Admitting patient to progressive unit - Continue cardiac monitoring  Sepsis Cystitis Diarrhea -Patient was hypotensive 89/66, tachycardic 125, Leukocytosis 17 and lactic acidosis 1.8..  Patient denies any fever, chill, abdominal pain, cough, chest pain and shortness of breath.  Respiratory panel unremarkable.  Patient is having diarrhea.  In the ED patient has been associated with 3.5 L of LR bolus.  Concern for sepsis in the setting of diarrhea versus any bloodstream infection. -Blood  cultures has been obtained in the ED but single set afterward patient has been started on IV vancomycin, cefepime and metronidazole. -Unable to obtain blood cultures as labile to me stating that patient already being started on IV antibiotic and blood cultures has been drawn already. -CT abdomen pelvis questionable pancreatic haziness, bronchitis without any evidence of pneumonia, aortic atherosclerosis, diverticulosis without any evidence of diverticulitis. - Plan to continue broad-spectrum antibiotic coverage with IV vancomycin, cefepime and metronidazole. - Checking GI and C. difficile panel. -UA showed evidence of UTI.  Pending urine culture. -Continue vancomycin, cefepime metronidazole; consider de-escalate antibiotics based on patient's hemodynamic status, afebrile status and leukocytosis.  Also will follow-up with blood cultures. -Continue maintenance fluid LR 75 cc/h.  Recent ischemic stroke Left vertebral and basilar artery stenosis status post basilar artery angioplasty 04/04/2023 on dual antiplatelet therapy aspirin and Brilinta -Holding aspirin and Brilinta as of now in the setting of GI bleed. - ED physician spoke with neurology and Dr. Wilford Corner stated that it is not ideal to hold antiplatelet therapy as patient just have stent placement 1 week ago but it is okay to hold Brilinta until GI evaluation will be completed continue aspirin.Marland Kitchen  Neurology recommended MRI of the brain and MRI head and neck to rule out any recurrent stroke and stent rethrombosis. -Neurology recommended to continue aspirin and hold the Brilinta but restart Brilinta when medically possible - Continue neurochecks every 4 hours. -Need to follow-up with MRI of the brain as well as MRI of the head and neck. - Continue Lipitor 40 mg daily. -Need to reach out to neurology for further question or concern.  Grade 1 diastolic heart failure preserved EF 55 to 60% - Echo from 04/02/2023 showed grade 1 diastolic heart failure  with preserved EF 55 to 60%. -Currently patient is hypotensive and not on any blood pressure regimen.  C -Once patient is out of acuity of the illness need spironolactone and Lasix for the management of diastolic heart failure.  Non-insulin-dependent DM type II -Currently patient is n.p.o. for GI evaluation in the daytime for possible EGD versus colonoscopy the setting of GI bleed.  Continue to check POC blood glucose every 6 hour to avoid hypo or hyperglycemia.  Hyperlipidemia -Continue Lipitor   DVT prophylaxis:  SCDs Code Status:  Full Code Diet: Currently n.p.o. Family Communication: No family member at bedside now. Disposition Plan: Pending further GI workup, MRI of the brain, MR angio head and neck. Consults: Gastroenterology and neurology Admission status:   Inpatient, Step Down Unit  Severity of Illness: The appropriate patient status for this patient is INPATIENT. Inpatient status is judged to be reasonable and necessary in order to provide the required intensity of service to ensure the patient's safety. The patient's presenting symptoms, physical exam findings, and initial radiographic and laboratory data in the context of their chronic comorbidities is felt to place them at high risk for further clinical deterioration. Furthermore, it is not anticipated that the patient will be medically stable for discharge from the hospital within 2 midnights of admission.   * I certify that at the point of admission it is my clinical judgment that the patient will require inpatient hospital care spanning beyond 2 midnights from the point of admission due to high intensity of service, high risk for further deterioration and high frequency of surveillance required.Marland Kitchen    Tereasa Coop, MD [facility]  How to contact the  [facility] Attending or Consulting provider 7A - 7P or covering provider during after hours 7P -7A, for this patient.  Check the care team in [facility] and look for a)  attending/consulting [facility] provider listed and b) the [facility] team listed Log into [email or web address] and use [place]'s universal password to access. If you do not have the password, please contact the hospital operator. Locate the [facility] provider you are looking for under [facility] and page to a number that you can be directly reached. If you still have difficulty reaching the provider, please page the [facility] (Director on Call) for the Hospitalists listed on amion for assistance.  04/11/2023, 6:11 AM

## 2023-04-11 NOTE — Progress Notes (Addendum)
   MRI brain showed: 1. MRI evidence of acute intra-axial hemorrhage at the right cerebral peduncle, proximally 8 mm. Mild surrounding edema. No significant mass effect. No intraventricular or extra-axial extension identified. 2. Otherwise expected evolution of brainstem and left cerebellar infarcts since 04/01/2023. No other acute intracranial abnormality. 3. Improved basilar artery flow void since 04/01/2023, evidence of continued occlusion distal left vertebral artery. See MRA today reported separately.   MRI angiogram of head and neck: IMPRESSION: 1. Unchanged distal left vertebral artery occlusion. Distal right vertebral artery remains patent. Patent proximal Basilar artery stent and improved distal Basilar, bilateral SCA and PCA flow related enhancement since 03/31/2061. 2. Stable anterior circulation, mild to moderate bilateral supraclinoid ICA atherosclerotic stenosis. 3. Right cerebral peduncle hemorrhage on MRI today is reported Separately   -Based on the MRI brain findings spoke with on-call neurology Dr. Wilford Corner who will see the patient again and will give [place] further recommendation. Continue aspirin 81 mg daily and holding Brilinta.  Dr. Wilford Corner stated that no need to inform neurosurgery in this case as it is not an traumatic brain injury related brain bleed.   Will follow-up with neurology further recommendation.  Consult note is pending.  -I have informed Dr. Santina Evans regarding this patient's new findings and informed him to follow-up with neurology consult note for further guidance.    Tereasa Coop, MD [facility] 04/11/2023, 7:14 AM

## 2023-04-11 NOTE — Consult Note (Signed)
NEUROLOGY CONSULT NOTE   Date of service: April 11, 2023 Patient Name: Gavin Dorsey MRN:  [id number] DOB:  [birth year] Chief Complaint: "Weakness" Requesting Provider: Tereasa Coop, MD  History of Present Illness  Gavin Dorsey is a 62 y.o. male  has a past medical history of Diabetes mellitus without complication (HCC) and Hypertension.   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  He was admitted to the hospitalist service after his hemoglobin showed a 4 point drop with concern for GI bleed, to be managed by blood transfusion. I was called by the hospitalist regarding recommendations on dual antiplatelets and I recommended that it is okay to hold one of the antiplatelets but continue the second antiplatelet and obtain brain imaging to ensure that there are no new strokes as well as MRA of the head to ensure that the basilar is patent.  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  In addition it also showed acute intra-axial hemorrhage at the right cerebral peduncle probably 8 mm with mild surrounding edema and no significant mass effect.  No intraventricular or extra-axial extension.  Otherwise expected evolution of brainstem and left cerebellar infarct since 2017 2024.  Improved basilar artery flow void on the MRI as well. Neurology was consulted   LKW: 12 PM total 26 2024 Modified rankin score: 1-No significant post stroke disability and can perform usual duties with stroke symptoms IV Thrombolysis: Active GI bleed, ICH EVT: No ELVO ICH Score:0  NIHSS components Score: Comment  1a Level of Conscious 0[x]  1[]  2[]  3[]       1b LOC Questions 0[x]  1[]  2[]       1c LOC Commands 0[x]  1[]  2[]       2 Best Gaze 0[x]  1[]  2[]       3 Visual 0[x]  1[]  2[]  3[]      4 Facial Palsy 0[x]  1[]  2[]  3[]      5a Motor Arm - left 0[x]  1[]  2[]  3[]  4[]  UN[]    5b Motor Arm - Right 0[x]  1[]  2[]  3[]  4[]  UN[]    6a Motor Leg - Left 0[x]  1[]  2[]  3[]  4[]  UN[]    6b Motor Leg - Right 0[x]  1[]  2[]  3[]  4[]  UN[]    7 Limb Ataxia 0[x]  1[]  2[]  3[]  UN[]     8 Sensory 0[x]  1[]  2[]  UN[]      9 Best Language 0[x]  1[]  2[]  3[]      10 Dysarthria 0[]  1[x]  2[]  UN[]      11 Extinct. and Inattention 0[x]  1[]  2[]       TOTAL: 1      ROS  Comprehensive ROS performed and pertinent positives documented in HPI   Past History   Past Medical History:  Diagnosis Date   Diabetes mellitus without complication (HCC)    Hypertension     Past Surgical History:  Procedure Laterality Date   IR ANGIO INTRA EXTRACRAN SEL COM CAROTID INNOMINATE BILAT MOD SED  04/02/2023   IR ANGIO VERTEBRAL SEL SUBCLAVIAN INNOMINATE UNI L MOD SED  04/02/2023   IR ANGIO VERTEBRAL SEL VERTEBRAL UNI R MOD SED  04/02/2023   IR ANGIO VERTEBRAL SEL VERTEBRAL UNI R MOD SED  04/04/2023   IR CT HEAD LTD  04/04/2023   IR  INTRA CRAN STENT  04/04/2023   IR US GUIDE VASC ACCESS RIGHT  04/02/2023   IR US GUIDE VASC ACCESS RIGHT  04/04/2023   RADIOLOGY WITH ANESTHESIA N/A 04/04/2023   Procedure: basilar artery angioplasty;  Surgeon: Julieanne Cotton, MD;  Location: [facility] OR;  Service: Radiology;  Laterality: N/A;    Family History: History reviewed. No pertinent family history.  Social History  reports that he has never smoked. He has never used smokeless tobacco. He reports current alcohol use. He reports that he does not use drugs.  No Known Allergies  Medications   Current Facility-Administered Medications:    0.9 %  sodium chloride infusion (Manually program via Guardrails IV Fluids), , Intravenous, Once, Sundil, Subrina, MD   0.9 %  sodium chloride infusion, 250 mL, Intravenous,  PRN, Janalyn Shy, Subrina, MD   acetaminophen (TYLENOL) tablet 650 mg, 650 mg, Oral, Q6H PRN **OR** acetaminophen (TYLENOL) suppository 650 mg, 650 mg, Rectal, Q6H PRN, Sundil, Subrina, MD   ceFEPIme (MAXIPIME) 2 g in sodium chloride 0.9 % 100 mL IVPB, 2 g, Intravenous, Q12H, Stevphen Rochester, RPH   lactated ringers infusion, , Intravenous, Continuous, Sundil, Subrina, MD, Last Rate: 75 mL/hr at 04/11/23 0507, New Bag at 04/11/23 0507   metroNIDAZOLE (FLAGYL) IVPB 500 mg, 500 mg, Intravenous, Q12H, Sundil, Subrina, MD   ondansetron (ZOFRAN) tablet 4 mg, 4 mg, Oral, Q6H PRN **OR** ondansetron (ZOFRAN) injection 4 mg, 4 mg, Intravenous, Q6H PRN, Sundil, Subrina, MD   pantoprazole (PROTONIX) injection 40 mg, 40 mg, Intravenous, Q12H, Sundil, Subrina, MD   rosuvastatin (CRESTOR) tablet 40 mg, 40 mg, Oral, Daily, Sundil, Subrina, MD   sodium chloride flush (NS) 0.9 % injection 3 mL, 3 mL, Intravenous, Q12H, Sundil, Subrina, MD   sodium chloride flush (NS) 0.9 % injection 3 mL, 3 mL, Intravenous, Q12H, Sundil, Subrina, MD   sodium chloride flush (NS) 0.9 % injection 3 mL, 3 mL, Intravenous, PRN, Janalyn Shy, Subrina, MD   ticagrelor (BRILINTA) tablet 90 mg, 90 mg, Oral, BID,    ticagrelor (BRILINTA) tablet 90 mg, 90 mg, Oral, BID,    vancomycin (VANCOCIN) IVPB 1000 mg/200 mL premix, 1,000 mg, Intravenous, Q24H, [place], Llana Aliment, RPH  Current Outpatient Medications:    AMBULATORY NON FORMULARY MEDICATION, Testosterone Cream 20%  Apply 0.5cc daily  Quantity: 30ml Refill 5  [facility] Jerlyn Ly [phone], Disp: 30 mL, Rfl: 5   aspirin 81 MG chewable tablet, Chew 1 tablet (81 mg total) by mouth daily., Disp: 30 tablet, Rfl: 2   diazepam (VALIUM) 5 MG tablet, Take 5 mg by mouth 2 (two) times daily as needed for anxiety., Disp: , Rfl:    rosuvastatin (CRESTOR) 40 MG tablet, Take 1 tablet (40 mg total) by mouth daily., Disp: 30 tablet, Rfl: 2   tadalafil  (CIALIS) 5 MG tablet, Take 1 tablet (5 mg total) by mouth daily as needed for erectile dysfunction., Disp: 90 tablet, Rfl: 2   ticagrelor (BRILINTA) 90 MG TABS tablet, Take 1 tablet (90 mg total) by mouth 2 (two) times daily., Disp: 60 tablet, Rfl: 2   ticagrelor (BRILINTA) 90 MG TABS tablet, Take 1 tablet (90 mg total) by mouth 2 (two) times daily., Disp: 60 tablet, Rfl: 2  Vitals   Vitals:   04/11/23 0145 04/11/23 0300 04/11/23 0430 04/11/23 0500  BP: 133/74 (!) 148/89 133/80 129/81  Pulse: 95 88 89 89  Resp: (!) 29 (!) 26 (!) 26 (!) 22  Temp:  TempSrc:      SpO2: 100% 100% 100% 98%  Weight:      Height:        Body mass index is 33.6 kg/m.  Physical Exam   Constitutional: Appears well-developed and well-nourished.  Psych: Affect appropriate to situation.  Eyes: No scleral injection.  HENT: No OP obstruction.  Head: Normocephalic.  Cardiovascular: Normal rate and regular rhythm.  Respiratory: Effort normal, non-labored breathing.  GI: Soft.  No distension. There is no tenderness.  Skin: WDI.   Neurologic Examination  Awake alert oriented x 3 Mild dysarthria Aphasia Cranial 2-12 intact Motor examination with no drift in any of the 4 extremities although left arm seems mildly weaker than the right. Sensation intact light touch Coordination examination with no dysmetria.   Labs/Imaging/Neurodiagnostic studies   CBC:  Recent Labs  Lab 04/30/23 0408 04/10/23 2114  WBC 9.4 17.0*  NEUTROABS 8.0* 13.0*  HGB 15.5 12.1*  HCT 46.4 36.9*  MCV 85.0 85.6  PLT 218 337   Basic Metabolic Panel:  Lab Results  Component Value Date   NA 137 04/10/2023   K 4.4 04/10/2023   CO2 21 (L) 04/10/2023   GLUCOSE 158 (H) 04/10/2023   BUN 71 (H) 04/10/2023   CREATININE 1.56 (H) 04/10/2023   CALCIUM 8.8 (L) 04/10/2023   GFRNONAA 50 (L) 04/10/2023   GFRAA >60 07/19/2016   Lipid Panel:  Lab Results  Component Value Date   LDLCALC 101 (H) 04/02/2023   HgbA1c:  Lab  Results  Component Value Date   HGBA1C 5.2 04/01/2023    Alcohol Level     Component Value Date/Time   ETH <10 04/01/2023 0828   INR  Lab Results  Component Value Date   INR 1.0 04/01/2023   APTT  Lab Results  Component Value Date   APTT 31 04/01/2023    MRI Brain(Personally reviewed): Acute intra-axial hemorrhage of the right cerebral peduncle, approximately 8 mm.  Mild surrounding edema.  No significant mass effect.  No intraventricular or extra-axial or extension.  Otherwise expected evolution of brainstem and left cerebellar infarct since 04/01/2023.  Improved basilar artery flow void since 04/01/2023.  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  MRA head without contrast: Personally reviewed: Unchanged distal left vertebral artery occlusion.  Distal right vertebral artery is patent.  Proximal basilar artery stent with improved flow.  Bilateral ICA and PCA patent.  Stable anterior circulation.  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  Right cerebral peduncle hemorrhage on MRI.  ASSESSMENT   CATON TEFFT is a 62 y.o. male  has a past medical history of Diabetes mellitus without complication (HCC) and Hypertension.  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 At this time, the etiology of the bleed is likely secondary to being on dual antiplatelets. It is imperative that he stay on an antiplatelet for his basilar stent patency. The bleed size is small. I would continue single antiplatelet and image further.  Impression Intracerebral hemorrhage-nontraumatic-likely secondary to dual antiplatelet use GI bleed-also likely secondary to dual  antiplatelet use  RECOMMENDATIONS  Continue aspirin for now Short-term follow-up with imaging-repeat head CT in 3 hours. Blood pressure goal strictly 130-150. No coagulants at this time He had  a stroke workup done less than 10 days ago-no need to repeat. Management of GI bleed per medicine. I will inform the neurointerventional radiologist and neuro IR and stroke team will follow. Prelim discussion with Dr. Corliss Skains - OK to use ASA.  Hold Brilinta and consider lovenox. Dr. Corliss Skains to follow as well Plan was discussed with Dr. Janalyn Shy ______________________________________________________________________    Signed, Milon Dikes, MD [facility]

## 2023-04-11 NOTE — ED Provider Notes (Signed)
[place] EMERGENCY DEPARTMENT AT [facility] Provider Note   CSN: [id number] Arrival date & time: 04/10/23  2034     History  Chief Complaint  Patient presents with   Weakness    Gavin Dorsey is a 62 y.o. male.  Patient with past medical history significant for CVA, discharged on December 22 2 weeks ago after admission on 12/17, HTN, T2DM presents to the emergency department complaining of worsening weakness, dark stools, diarrhea, nausea, vomiting over the past 24 hours.  Patient states he began taking Brilinta at time of discharge 2 days ago.  He denies chest pain, shortness of breath, urinary symptoms.  He does report history of intentional weight loss over the past year or so with loss of upwards of 150 pounds.  At the time of my assessment triage labs had resulted showing a leukocytosis of 17,000, hemoglobin of 12.1 down from 18.0 10 days ago, with tachycardia.  Patient met SIRS criteria and a code sepsis was activated in the setting of nausea, vomiting, diarrhea.  Broad-spectrum antibiotics were also initiated.   Weakness      Home Medications Prior to Admission medications   Medication Sig Start Date End Date Taking? Authorizing Provider  AMBULATORY NON FORMULARY MEDICATION Testosterone Cream 20%  Apply 0.5cc daily  Quantity: 30ml Refill 5  [facility] [address] Dr. Jerlyn Ly [phone] 08/19/22   Riki Altes, MD  aspirin 81 MG chewable tablet Chew 1 tablet (81 mg total) by mouth daily. 04/07/23   de Saintclair Halsted, Cortney E, NP  diazepam (VALIUM) 5 MG tablet Take 5 mg by mouth 2 (two) times daily as needed for anxiety.    [provider]  rosuvastatin (CRESTOR) 40 MG tablet Take 1 tablet (40 mg total) by mouth daily. 04/07/23   de Saintclair Halsted, Cortney E, NP  tadalafil (CIALIS) 5 MG tablet Take 1 tablet (5 mg total) by mouth daily as needed for erectile dysfunction. 01/02/23   Stoioff, Verna Czech, MD   ticagrelor (BRILINTA) 90 MG TABS tablet Take 1 tablet (90 mg total) by mouth 2 (two) times daily. 04/06/23   de Saintclair Halsted, Cortney E, NP  ticagrelor (BRILINTA) 90 MG TABS tablet Take 1 tablet (90 mg total) by mouth 2 (two) times daily. 04/07/23   de Verdis Prime, NP      Allergies    Patient has no known allergies.    Review of Systems   Review of Systems  Neurological:  Positive for weakness.    Physical Exam Updated Vital Signs BP (!) 148/89   Pulse 88   Temp 99.4 F (37.4 C) (Oral)   Resp (!) 26   Ht 5\' 8"  (1.727 m)   Wt 100.2 kg   SpO2 100%   BMI 33.60 kg/m  Physical Exam Vitals and nursing note reviewed.  Constitutional:      General: He is not in acute distress.    Appearance: He is well-developed.  HENT:     Head: Normocephalic and atraumatic.  Eyes:     Conjunctiva/sclera: Conjunctivae normal.  Cardiovascular:     Rate and Rhythm: Regular rhythm. Tachycardia present.  Pulmonary:     Effort: Pulmonary effort is normal. No respiratory distress.     Breath sounds: Normal breath sounds.  Abdominal:     Palpations: Abdomen is soft.     Tenderness: There is no abdominal tenderness.  Genitourinary:    Rectum: Guaiac result positive.  Musculoskeletal:  General: No swelling.     Cervical back: Neck supple.  Skin:    General: Skin is warm and dry.     Capillary Refill: Capillary refill takes less than 2 seconds.  Neurological:     Mental Status: He is alert.  Psychiatric:        Mood and Affect: Mood normal.     ED Results / Procedures / Treatments   Labs (all labs ordered are listed, but only abnormal results are displayed) Labs Reviewed  CBC WITH DIFFERENTIAL/PLATELET - Abnormal; Notable for the following components:      Result Value   WBC 17.0 (*)    Hemoglobin 12.1 (*)    HCT 36.9 (*)    RDW 16.4 (*)    Neutro Abs 13.0 (*)    Monocytes Absolute 1.6 (*)    Abs Immature Granulocytes 0.08 (*)    All other components within normal  limits  COMPREHENSIVE METABOLIC PANEL - Abnormal; Notable for the following components:   CO2 21 (*)    Glucose, Bld 158 (*)    BUN 71 (*)    Creatinine, Ser 1.56 (*)    Calcium 8.8 (*)    Total Protein 6.4 (*)    Albumin 3.0 (*)    GFR, Estimated 50 (*)    All other components within normal limits  URINALYSIS, ROUTINE W REFLEX MICROSCOPIC - Abnormal; Notable for the following components:   APPearance HAZY (*)    Hgb urine dipstick SMALL (*)    Ketones, ur 5 (*)    Leukocytes,Ua LARGE (*)    Bacteria, UA RARE (*)    All other components within normal limits  RESP PANEL BY RT-PCR (RSV, FLU A&B, COVID)  RVPGX2  CULTURE, BLOOD (SINGLE)  CULTURE, BLOOD (ROUTINE X 2)  CULTURE, BLOOD (ROUTINE X 2)  GASTROINTESTINAL PANEL BY PCR, STOOL (REPLACES STOOL CULTURE)  C DIFFICILE QUICK SCREEN W PCR REFLEX    LIPASE, BLOOD  LACTIC ACID, PLASMA  HEMOGLOBIN AND HEMATOCRIT, BLOOD  HEMOGLOBIN AND HEMATOCRIT, BLOOD  HEMOGLOBIN AND HEMATOCRIT, BLOOD  POC OCCULT BLOOD, ED  I-STAT CG4 LACTIC ACID, ED  I-STAT CG4 LACTIC ACID, ED  TYPE AND SCREEN  PREPARE RBC (CROSSMATCH)    EKG None  Radiology CT CHEST ABDOMEN PELVIS W CONTRAST Result Date: 04/11/2023 CLINICAL DATA:  Dark stools, hematemesis and generalized weakness left upper extremity. Being evaluated for sepsis. Checking for source. 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 EXAM: CT CHEST, ABDOMEN, AND PELVIS WITH CONTRAST TECHNIQUE: Multidetector CT imaging of the chest, abdomen and pelvis was performed following the standard protocol during bolus administration of intravenous contrast. RADIATION DOSE REDUCTION: This exam was performed according to the departmental dose-optimization program which includes automated exposure control, adjustment of the mA and/or kV according to patient size and/or use of iterative reconstruction technique. CONTRAST:  75mL OMNIPAQUE IOHEXOL 350 MG/ML  SOLN COMPARISON:  No recent chest x-ray. Comparison is made with the abdomen and pelvis CT with contrast 11/12/2021 and a chest CT with contrast 09/11/2006. FINDINGS: CT CHEST FINDINGS Cardiovascular: The cardiac size is normal. There is no pericardial effusion. The pulmonary arteries and veins are normal in caliber. The pulmonary arteries are centrally clear. Small amount of scattered aortic and great vessel atherosclerosis. There is no stenosis or dissection. The aortic root measures 4.0 cm at the sinuses of Valsalva on 6:107. The mid ascending segment 4.1 cm on 6:111. Annual follow-up imaging recommended. Mediastinum/Nodes: No  enlarged mediastinal, hilar, or axillary lymph nodes. The lower poles of the thyroid gland, trachea, and esophagus demonstrate no significant findings. Lungs/Pleura: There is diffuse bronchial thickening. There is no consolidation, effusion, pneumothorax or visible pulmonary nodule. 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 No acute or other significant osseous findings. Unremarkable chest wall. CT ABDOMEN PELVIS FINDINGS Hepatobiliary: No focal liver abnormality is seen. No calcified gallstones, gallbladder wall thickening, or biliary dilatation. Pancreas: Questionable haziness at the pancreatic tail versus artifact due to beam hardening from the patient's arms in the field. Correlate with serum lipase for possible significance. There is no pancreatic mass enhancement or ductal dilatation, no other focal abnormality. Spleen: No abnormality. Adrenals/Urinary Tract: Adrenal glands are unremarkable. Kidneys are normal, without renal calculi, focal lesion, or hydronephrosis. Bladder is unremarkable. Stomach/Bowel: There is fluid filling of the nondilated stomach. Normal wall thickness. The unopacified small bowel is normal caliber without mesenteric inflammatory change. The appendix is normal. There is scattered colonic diverticulosis  without evidence of diverticulitis. Vascular/Lymphatic: Aortic atherosclerosis. No enlarged abdominal or pelvic lymph nodes. Reproductive: There is mild prostatomegaly, stable. Other: No abdominal wall hernia or abnormality. No abdominopelvic ascites. There is no free hemorrhage or free air. Musculoskeletal: Facet hypertrophy and spondylosis lumbar spine. No acute or other significant osseous findings. Mild hip DJD. Moderate acquired spinal stenosis L4-5. IMPRESSION: 1. Questionable haziness at the pancreatic tail versus artifact due to beam hardening from the patient's arms in the field. Correlate with serum lipase for possible significance. 2. Bronchitis without evidence of pneumonia. 3. Aortic atherosclerosis. 4. 4.0 cm aortic root and 4.1 cm mid ascending segment. Recommend annual imaging followup by CTA or MRA. This recommendation follows 2010 ACCF/AHA/AATS/ACR/ASA/ SCA/SCAI/SIR/STS/SVM Guidelines for the Diagnosis and Management of Patients with Thoracic Aortic Disease. Circulation. 2010; 121: Z610-R604. Aortic aneurysm NOS (ICD10-I71.9). 5. Diverticulosis without evidence of diverticulitis. 6. Mild prostatomegaly. 7. Moderate acquired spinal stenosis L4-5. Aortic Atherosclerosis (ICD10-I70.0). Electronically Signed   By: Almira Bar M.D.   On: 04/11/2023 01:47    Procedures .Critical Care  Performed by: Darrick Grinder, PA-C Authorized by: Darrick Grinder, PA-C   Critical care provider statement:    Critical care time (minutes):  35   Critical care was necessary to treat or prevent imminent or life-threatening deterioration of the following conditions:  Sepsis   Critical care was time spent personally by me on the following activities:  Development of treatment plan with patient or surrogate, discussions with consultants, evaluation of patient's response to treatment, examination of patient, ordering and review of laboratory studies, ordering and review of radiographic studies, ordering and  performing treatments and interventions, pulse oximetry, re-evaluation of patient's condition and review of old charts   Care discussed with: admitting provider       Medications Ordered in ED Medications  lactated ringers infusion (has no administration in time range)  rosuvastatin (CRESTOR) tablet 40 mg (has no administration in time range)  sodium chloride flush (NS) 0.9 % injection 3 mL (has no administration in time range)  metroNIDAZOLE (FLAGYL) IVPB 500 mg (has no administration in time range)  acetaminophen (TYLENOL) tablet 650 mg (has no administration in time range)    Or  acetaminophen (TYLENOL) suppository 650 mg (has no administration in time range)  sodium chloride flush (NS) 0.9 % injection 3 mL (has no administration in time range)  sodium chloride flush (NS) 0.9 % injection 3 mL (has no administration in time range)  0.9 %  sodium chloride  infusion (has no administration in time range)  ondansetron (ZOFRAN) tablet 4 mg (has no administration in time range)    Or  ondansetron (ZOFRAN) injection 4 mg (has no administration in time range)  0.9 %  sodium chloride infusion (Manually program via Guardrails IV Fluids) (has no administration in time range)  pantoprazole (PROTONIX) injection 40 mg (has no administration in time range)  lactated ringers bolus 1,000 mL (0 mLs Intravenous Stopped 04/11/23 0140)    And  lactated ringers bolus 1,000 mL (0 mLs Intravenous Stopped 04/11/23 0140)    And  lactated ringers bolus 1,000 mL (0 mLs Intravenous Stopped 04/11/23 0325)    And  lactated ringers bolus 500 mL (0 mLs Intravenous Stopped 04/11/23 0324)  ceFEPIme (MAXIPIME) 2 g in sodium chloride 0.9 % 100 mL IVPB (0 g Intravenous Stopped 04/11/23 0140)  metroNIDAZOLE (FLAGYL) IVPB 500 mg (0 mg Intravenous Stopped 04/11/23 0141)  pantoprazole (PROTONIX) injection 40 mg (40 mg Intravenous Given 04/11/23 0136)  vancomycin (VANCOCIN) IVPB 1000 mg/200 mL premix (0 mg Intravenous Stopped  04/11/23 0149)    Followed by  vancomycin (VANCOCIN) IVPB 1000 mg/200 mL premix (0 mg Intravenous Stopped 04/11/23 0325)  iohexol (OMNIPAQUE) 350 MG/ML injection 75 mL (75 mLs Intravenous Contrast Given 04/11/23 0108)    ED Course/ Medical Decision Making/ A&P Clinical Course as of 04/11/23 0423  Thu Apr 10, 2023  2308 ECHO 12/18, 55-60%, G1DD  [SG]    Clinical Course User Index [SG] Sloan Leiter, DO                                 Medical Decision Making Amount and/or Complexity of Data Reviewed Labs: ordered. Radiology: ordered.  Risk Prescription drug management. Decision regarding hospitalization.   This patient presents to the ED for concern of nausea, vomiting, diarrhea, melena, weakness, this involves an extensive number of treatment options, and is a complaint that carries with it a high risk of complications and morbidity.  The differential diagnosis includes GI bleed, gastroenteritis, appendicitis, cholecystitis, others   Co morbidities that complicate the patient evaluation  Recent stroke on Brilinta   Additional history obtained:  Additional history obtained from family at bedside External records from outside source obtained and reviewed including discharge summary   Lab Tests:  I Ordered, and personally interpreted labs.  The pertinent results include: Lactic acid 1.8 (after think, cefepime, 2 L LR, Flagyl), leukocytosis with white count of 17,000, hemoglobin 12.1 (15.5 6 days ago, 18.0 10 days ago), UA with small hemoglobin, large leukocytes, rare bacteria   Imaging Studies ordered:  I ordered imaging studies including CT chest abdomen pelvis with contrast I independently visualized and interpreted imaging which showed  1. Questionable haziness at the pancreatic tail versus artifact due  to beam hardening from the patient's arms in the field. Correlate  with serum lipase for possible significance.  2. Bronchitis without evidence of pneumonia.  3.  Aortic atherosclerosis.  4. 4.0 cm aortic root and 4.1 cm mid ascending segment. Recommend  annual imaging followup by CTA or MRA. This recommendation follows  2010 ACCF/AHA/AATS/ACR/ASA/ SCA/SCAI/SIR/STS/SVM Guidelines for the  Diagnosis and Management of Patients with Thoracic Aortic Disease.  Circulation. 2010; 121: [id number]. Aortic aneurysm NOS  (ICD10-I71.9).  5. Diverticulosis without evidence of diverticulitis.  6. Mild prostatomegaly.  7. Moderate acquired spinal stenosis L4-5.    Aortic Atherosclerosis   I agree with the radiologist interpretation  Cardiac Monitoring: / EKG:  The patient was maintained on a cardiac monitor.  I personally viewed and interpreted the cardiac monitored which showed an underlying rhythm of: sinus tachycardia with PACs   Consultations Obtained:  I requested consultation with the hospitalist, Dr.Sundil, and discussed lab and imaging findings as well as pertinent plan - they recommend: admission  I messaged Dr. Leonides Schanz with gastroenterology to have patient added to rounding list. I discussed case with Dr.Arora with neurology who will add patient to stroke rounding team. He requested repeat MR brain.    Problem List / ED Course / Critical interventions / Medication management   I ordered medication including broad-spectrum antibiotics including vancomycin, Flagyl, cefepime; LR bolus for sepsis criteria, Protonix for GI bleed  Reevaluation of the patient after these medicines showed that the patient improved I have reviewed the patients home medicines and have made adjustments as needed    Test / Admission - Considered:  Patient meeting SIRS criteria upon arrival.  Code sepsis initiated and broad-spectrum antibiotics administered.  CT abdomen pelvis chest grossly unremarkable.  UA does not show obvious infection.  Lactic acid was 1.8 but this was drawn after 2 L of fluid and antibiotics, unclear if he may have had elevated lactic acid prior to  these treatments.  Patient also with melena and 6 point hemoglobin drop of the past 10 days, currently on Brilinta.  Feel patient will benefit from admission for further management of his nausea vomiting diarrhea with SIRS criteria along with evaluation by GI due to apparent new GI bleed.         Final Clinical Impression(s) / ED Diagnoses Final diagnoses:  Sepsis, due to unspecified organism, unspecified whether acute organ dysfunction present [facility])  Gastrointestinal hemorrhage, unspecified gastrointestinal hemorrhage type    Rx / DC Orders ED Discharge Orders     None         Pamala Duffel 04/11/23 0423    Sloan Leiter, DO 04/11/23 [phone]

## 2023-04-11 NOTE — Hospital Course (Addendum)
62 y.o. 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 Patient stated that he began taking Brilinta at time of the discharge 2 days ago. During my evaluation at the bedside patient reported that he has 3-4 episodes of loose stool in last 24 hours and also noticed black-colored stool for 3 to episodes as well.  Denies any bright red blood per rectum.  Patient denies any abdominal cramping and pain.  Denies any nausea, vomiting, fever and chill. Patient reported that since his last stroke he is still having some weakness of the left upper extremity but denies any generalized weakness, dysarthria and dysphagia. Patient denies any chest pain and shortness of breath.  While in ED, pt underwent MRI brain which was notable for acute intra-axial hemorrhage at the R cerebral peduncle, 8mm without significant mass effect. Neurology was consulted. GI was also consulted for concerns of GI bleed.

## 2023-04-11 NOTE — Progress Notes (Addendum)
STROKE TEAM PROGRESS NOTE   BRIEF HPI [name] is a 62 y.o. male with history of HTN, HLD, DM2, recent acute and subacute brainstem and left cerebellar hemisphere infarctions 04/01/23 s/p basilar angioplasty and stenting with discharge on 12/22 on aspirin and Brilinta, statin presenting with dark stools, recent black bilious emesis, and left arm weakness. Initial evaluation reveals a + FOBT and neuroimaging revealed an acute intra-axial hemorrhage at the right cerebral peduncle with mild surrounding edema and no significant mass effect and was admitted for further evaluation.   SIGNIFICANT HOSPITAL EVENTS 12/27 - Admission for +FOBT and ICH - MRI brain 05:00 acute intra-axial hemorrhage at the right cerebral peduncle approximately 8 mm with mild surrounding edema and no significant mass effect - CT head stability scan 08:40 with stable ICH - Patient reports no vomiting in 12 hours, Hgb 15.5 on previous discharge, 12.1 on admission   INTERIM HISTORY/SUBJECTIVE No further left upper extremity weakness appreciated on neurology exam   OBJECTIVE CBC    Component Value Date/Time   WBC 17.0 (H) 04/10/2023 2114   RBC 4.31 04/10/2023 2114   HGB 12.1 (L) 04/10/2023 2114   HGB 15.9 07/27/2011 1426   HCT 36.9 (L) 04/10/2023 2114   HCT 54.3 (H) 03/06/2023 1449   PLT 337 04/10/2023 2114   PLT 240 07/27/2011 1426   MCV 85.6 04/10/2023 2114   MCV 88 07/27/2011 1426   MCH 28.1 04/10/2023 2114   MCHC 32.8 04/10/2023 2114   RDW 16.4 (H) 04/10/2023 2114   RDW 13.5 07/27/2011 1426   LYMPHSABS 2.0 04/10/2023 2114   MONOABS 1.6 (H) 04/10/2023 2114   EOSABS 0.2 04/10/2023 2114   BASOSABS 0.1 04/10/2023 2114   BMET    Component Value Date/Time   NA 137 04/10/2023 2114   NA 139 07/27/2011 1426   K 4.4 04/10/2023 2114   K 3.6 07/27/2011 1426   CL 106 04/10/2023 2114   CL 104 07/27/2011 1426   CO2 21 (L) 04/10/2023 2114   CO2 27 07/27/2011 1426   GLUCOSE 158 (H) 04/10/2023 2114    GLUCOSE 123 (H) 07/27/2011 1426   BUN 71 (H) 04/10/2023 2114   BUN 17 07/27/2011 1426   CREATININE 1.56 (H) 04/10/2023 2114   CREATININE 1.27 07/27/2011 1426   CALCIUM 8.8 (L) 04/10/2023 2114   CALCIUM 8.3 (L) 07/27/2011 1426   GFRNONAA 50 (L) 04/10/2023 2114   GFRNONAA >60 07/27/2011 1426   Lab Results  Component Value Date   HGBA1C 5.2 04/01/2023   Lab Results  Component Value Date   CHOL 148 04/02/2023   HDL 27 (L) 04/02/2023   LDLCALC 101 (H) 04/02/2023   TRIG 96 04/06/2023   CHOLHDL 5.5 04/02/2023   IMAGING past 24 hours CT HEAD WO CONTRAST ( ) Result Date: 04/11/2023 CLINICAL DATA:  Provided history: Weakness. EXAM: CT HEAD WITHOUT CONTRAST TECHNIQUE: Contiguous axial images were obtained from the base of the skull through the vertex without intravenous contrast. RADIATION DOSE REDUCTION: This exam was performed according to the departmental dose-optimization program which includes automated exposure control, adjustment of the mA and/or kV according to patient size and/or use of iterative reconstruction technique. COMPARISON:  Brain MRI 04/11/2023. Non-contrast head CT and CT angiogram head/neck 04/01/2023. FINDINGS: Brain: Mild generalized cerebral atrophy. Known 8 mm acute parenchymal hemorrhage at the right thalamocapsular junction/right cerebral peduncle. Mild surrounding edema, unchanged. Known acute/subacute infarcts within the brainstem, left middle cerebellar peduncle and left cerebellar hemisphere, occult by CT and better appreciated on  the brain MRI performed earlier today. Known small chronic cortical infarct within the left occipital lobe. Patchy and ill-defined hypoattenuation within the cerebral white matter, nonspecific but compatible with mild chronic small vessel ischemic disease. No extra-axial fluid collection. No evidence of an intracranial mass. No midline shift. Vascular: No hyperdense vessel. Vascular stent within the basilar artery. Atherosclerotic  calcifications. Skull: No calvarial fracture or aggressive osseous lesion. Sinuses/Orbits: No mass or acute finding within the imaged orbits. Minimal mucosal thickening scattered within the paranasal sinuses. IMPRESSION: 1. Known 8 mm acute parenchymal hemorrhage at the right thalamocapsular junction/right cerebral peduncle. Mild surrounding edema, unchanged. 2. Known acute/subacute infarcts within the brainstem, left middle cerebellar peduncle and left cerebellar hemisphere, occult by CT and better appreciated on the brain MRI performed earlier today. 3. 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 Electronically Signed   By: Jackey Loge D.O.   On: 04/11/2023 09:01   MR BRAIN WO CONTRAST Addendum Date: 04/11/2023 ADDENDUM REPORT: 04/11/2023 06:47 ADDENDUM: MRI and MRA findings were discussed by telephone with Dr. Tereasa Coop on 04/11/2023 3500 hours. Electronically Signed   By: Odessa Fleming M.D.   On: 04/11/2023 06:47   Result Date: 04/11/2023 CLINICAL DATA:  62 year old male with scattered brainstem and left cerebellar hemisphere infarcts earlier this month, left vertebral artery occlusion, severe basilar artery stenosis/pre-occlusion status post stent assisted Basilar angioplasty on 04/04/2023. Neurologic deficit. EXAM: MRI HEAD WITHOUT CONTRAST TECHNIQUE: Multiplanar, multiecho pulse sequences of the brain and surrounding structures were obtained without intravenous contrast. COMPARISON:  Brain MRI 04/01/2023. Intracranial MRA today reported separately. FINDINGS: Brain: Expected evolution of scattered, patchy brainstem and left cerebellar hemisphere infarcts since 04/01/2023. New DWI susceptibility at the right cerebral peduncle with surrounding patchy T2 and FLAIR hyperintensity (series 7, image 17, series 8, image 45) compatible with intra-axial hemorrhage estimated at about 8 mm diameter. No significant mass effect. No  other new diffusion abnormality when compared to 04/01/2023. No other intracranial hemorrhage identified. No IVH or ventriculomegaly. Stable other supratentorial gray and white matter signal, widely scattered nonspecific white matter T2 and FLAIR hyperintensity. An small area of left superior occipital pole encephalomalacia. No midline shift or intracranial mass effect. Basilar cisterns remain normal. Negative pituitary and cervicomedullary junction. Vascular: Continued absence of the distal left vertebral artery flow void. But improved basilar artery flow void since 04/01/2023. Other Major intracranial vascular flow voids are preserved. Skull and upper cervical spine: Stable, negative. Sinuses/Orbits: Stable, negative. Other: Mastoids remain well aerated. Grossly normal visible internal auditory structures. Stable visible scalp and face. IMPRESSION: 1. MRI evidence of acute intra-axial hemorrhage at the right cerebral peduncle, proximally 8 mm. Mild surrounding edema. No significant mass effect. No intraventricular or extra-axial extension identified. 2. Otherwise expected evolution of brainstem and left cerebellar infarcts since 04/01/2023. No other acute intracranial abnormality. 3. Improved basilar artery flow void since 04/01/2023, evidence of continued occlusion distal left vertebral artery. See MRA today reported separately. Electronically Signed: By: Odessa Fleming M.D. On: 04/11/2023 06:21   MR ANGIO HEAD WO CONTRAST Result Date: 04/11/2023 CLINICAL DATA:  62 year old male with scattered brainstem and left cerebellar hemisphere infarcts earlier this month, left vertebral artery occlusion, severe basilar artery stenosis/pre-occlusion status post stent assisted Basilar angioplasty on 04/04/2023. Neurologic deficit. EXAM: MRA HEAD WITHOUT CONTRAST TECHNIQUE: Angiographic images of the Circle of Willis were acquired using MRA technique without intravenous contrast. COMPARISON:  Brain MRI today. Pretreatment CTA  head and neck 04/01/2023. FINDINGS:  Anterior circulation: Tortuous distal cervical ICAs and antegrade flow signal throughout both ICA siphons. Ophthalmic artery origins appear normal. Mild to moderate narrowing of the supraclinoid ICAs appears stable. Patent carotid termini, MCA and ACA origins. Visible ACA and MCA branches appears stable. Posterior circulation: No distal left vertebral artery antegrade flow signal. Antegrade flow in the distal right vertebral artery appears stable to the vertebrobasilar junction. Right PICA origin is patent. New proximal basilar artery susceptibility artifact compatible with new vascular stent (series 3, image 37) with maintained antegrade flow in the distal basilar artery indicating stent patency. SCA and PCA origins are patent. Posterior communicating arteries are diminutive or absent. Bilateral PCA branches appear improved since 04/01/2023. Anatomic variants: None significant. Other: Subtle new susceptibility artifact at the right midbrain, cerebral peduncle. See MRI today reported separately. IMPRESSION: 1. Unchanged distal left vertebral artery occlusion. Distal right vertebral artery remains patent. Patent proximal Basilar artery stent and improved distal Basilar, bilateral SCA and PCA flow related enhancement since 03/31/2061. 2. Stable anterior circulation, mild to moderate bilateral supraclinoid ICA atherosclerotic stenosis. 3. Right cerebral peduncle hemorrhage on MRI today is reported separately. Electronically Signed   By: Odessa Fleming M.D.   On: 04/11/2023 06:26   CT CHEST ABDOMEN PELVIS W CONTRAST Result Date: 04/11/2023 CLINICAL DATA:  Dark stools, hematemesis and generalized weakness left upper extremity. Being evaluated for sepsis. Checking for source. 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 EXAM: CT CHEST, ABDOMEN, AND PELVIS WITH CONTRAST TECHNIQUE: Multidetector CT imaging of the chest, abdomen and  pelvis was performed following the standard protocol during bolus administration of intravenous contrast. RADIATION DOSE REDUCTION: This exam was performed according to the departmental dose-optimization program which includes automated exposure control, adjustment of the mA and/or kV according to patient size and/or use of iterative reconstruction technique. CONTRAST:  75mL OMNIPAQUE IOHEXOL 350 MG/ML SOLN COMPARISON:  No recent chest x-ray. Comparison is made with the abdomen and pelvis CT with contrast 11/12/2021 and a chest CT with contrast 09/11/2006. FINDINGS: CT CHEST FINDINGS Cardiovascular: The cardiac size is normal. There is no pericardial effusion. The pulmonary arteries and veins are normal in caliber. The pulmonary arteries are centrally clear. Small amount of scattered aortic and great vessel atherosclerosis. There is no stenosis or dissection. The aortic root measures 4.0 cm at the sinuses of Valsalva on 6:107. The mid ascending segment 4.1 cm on 6:111. Annual follow-up imaging recommended. Mediastinum/Nodes: No enlarged mediastinal, hilar, or axillary lymph nodes. The lower poles of the thyroid gland, trachea, and esophagus demonstrate no significant findings. Lungs/Pleura: There is diffuse bronchial thickening. There is no consolidation, effusion, pneumothorax or visible pulmonary nodule. 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 No acute or other significant osseous findings. Unremarkable chest wall. CT ABDOMEN PELVIS FINDINGS Hepatobiliary: No focal liver abnormality is seen. No calcified gallstones, gallbladder wall thickening, or biliary dilatation. Pancreas: Questionable haziness at the pancreatic tail versus artifact due to beam hardening from the patient's arms in the field. Correlate with serum lipase for possible significance. There is no pancreatic mass enhancement or ductal dilatation, no other focal abnormality. Spleen: No  abnormality. Adrenals/Urinary Tract: Adrenal glands are unremarkable. Kidneys are normal, without renal calculi, focal lesion, or hydronephrosis. Bladder is unremarkable. Stomach/Bowel: There is fluid filling of the nondilated stomach. Normal wall thickness. The unopacified small bowel is normal caliber without mesenteric inflammatory change. The appendix is normal. There is scattered colonic diverticulosis without evidence  of diverticulitis. Vascular/Lymphatic: Aortic atherosclerosis. No enlarged abdominal or pelvic lymph nodes. Reproductive: There is mild prostatomegaly, stable. Other: No abdominal wall hernia or abnormality. No abdominopelvic ascites. There is no free hemorrhage or free air. Musculoskeletal: Facet hypertrophy and spondylosis lumbar spine. No acute or other significant osseous findings. Mild hip DJD. Moderate acquired spinal stenosis L4-5. IMPRESSION: 1. Questionable haziness at the pancreatic tail versus artifact due to beam hardening from the patient's arms in the field. Correlate with serum lipase for possible significance. 2. Bronchitis without evidence of pneumonia. 3. Aortic atherosclerosis. 4. 4.0 cm aortic root and 4.1 cm mid ascending segment. Recommend annual imaging followup by CTA or MRA. This recommendation follows 2010 ACCF/AHA/AATS/ACR/ASA/ SCA/SCAI/SIR/STS/SVM Guidelines for the Diagnosis and Management of Patients with Thoracic Aortic Disease. Circulation. 2010; 121: A416-S063. Aortic aneurysm NOS (ICD10-I71.9). 5. Diverticulosis without evidence of diverticulitis. 6. Mild prostatomegaly. 7. Moderate acquired spinal stenosis L4-5. Aortic Atherosclerosis (ICD10-I70.0). Electronically Signed   By: Almira Bar M.D.   On: 04/11/2023 01:47   Vitals:   04/11/23 0644 04/11/23 0900 04/11/23 1045 04/11/23 1100  BP:  119/77  (!) 140/72  Pulse:  88  85  Resp:  (!) 25  (!) 23  Temp: 99 F (37.2 C)  98.9 F (37.2 C)   TempSrc: Oral  Oral   SpO2:  100%  100%  Weight:       Height:       PHYSICAL EXAM General:  Alert, well-nourished, well-developed patient in no acute distress laying in ER stretcher Psych:  Mood and affect appropriate for situation, patient is calm and cooperative with exam CV: Regular rate and rhythm on bedside cardiac monitor Respiratory:  Regular, unlabored respirations on room air GI: Abdomen soft, rounded  NEURO:  Mental Status: AA&Ox3, patient is able to give clear and coherent history Speech/Language: speech is without dysarthria or aphasia.  Naming, repetition, fluency, and comprehension intact.  Cranial Nerves:  II: PERRL. Visual fields full.  III, IV, VI: EOMI. Eyelids elevate symmetrically.  V: Sensation is intact to light touch and symmetrical to face.  VII: Face is symmetrical resting and and with movement VIII: Hearing is intact to voice. IX, X: Palate elevates symmetrically. Phonation is normal.  XI: Shoulder shrug 5/5. XII: Tongue is midline without fasciculations. Motor: 5/5 strength to all muscle groups tested.  Tone: is normal and bulk is normal Sensation: Intact to light touch bilaterally. Extinction absent to light touch to DSS.   Coordination: FTN intact bilaterally (initially there seemed to be some past pointing in the left upper extremity but with repeat examination, this improved), HKS: no ataxia in BLE.No drift.  Gait: Deferred  ASSESSMENT/PLAN  Intracerebral Hemorrhage:  acute intra-axial hemorrhage at the right cerebral peduncle, approximately 8 mm with mild surrounding edema Etiology:  Likely 2/2 DAPT from recent basilar stenting   CT Head (stability scan): Known 8 mm acute parenchymal hemorrhage at the right thalamic capsular junction/right cerebral peduncle.  Mild surrounding edema, unchanged.  Small chronic left occipital lobe cortical infarct. MRI : Acute small ICH at the right cerebral peduncle, approximately 8 mm.  Mild surrounding edema.  No significant mass effect.  No intraventricular or  extra-axial extension identified.  Otherwise expected evolution of brainstem and left cerebellar infarcts since 04/01/2023.  No other acute intracranial abnormality.  Improved basilar artery flow void since 04/01/2023, evidence of continued occlusion distal left vertebral artery. MRA : Unchanged distal left vertebral artery occlusion.  Distal right vertebral artery remains patent.  Patent proximal basilar artery  stent and improved distal basilar, bilateral SCA and PCA flow related enhancement since 04/01/2023.  Stable anterior circulation, mild to moderate bilateral supraclinoid ICA atherosclerotic stenosis.  2D Echo 04/02/23: LVEF 55 to 60%. LDL 101 HgbA1c 5.2 VTE prophylaxis - SCDs due to ICH and GIB aspirin 81 mg daily and Brilinta (ticagrelor) 90 mg bid prior to admission, now on aspirin 81 mg daily. Once stabilized and cleared per GI standpoint, consider additional Lovenox for stent patency.  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 Corliss Skains  Near occlusion of the  right vertebral basilar junction just distal to the right posterior inferior cerebellar  artery with a string sign opacification of the proximal one third to half  of the basilar artery.  Distal one third of the basilar artery opacifies  retrogradely via a severely diseased left posterior communicating artery.   Occluded  left vertebral artery at its origin without distal reconstitution. loaded with brilinta 12/19, discharged home on Brilinta and ASA DAPT Stent assisted angioplasty of basilar artery performed 12/20  Hypertensive during recent admission (12/17-12/22 requiring cleviprex and labetalol) Hypotensive blood pressure during current admission, query possible sepsis Home meds:  None Stable Single blood pressure reading 89/66 last night, improved today BP goal normotensive  Hyperlipidemia Home meds:  Crestor 40 mg daily LDL 101, goal < 70 Crestor resumed Continue statin at discharge  Diabetes type II Controlled Non-insulin dependent  Home meds:  None HgbA1c 5.2, goal < 7.0 CBGs SSI as needed Close PCP follow up for DM control  Other Stroke Risk Factors ETOH use, alcohol level <10, advised to drink no more than 2 drink(s) a day Obesity, Body mass index is 33.6 kg/m., BMI >/= 30 associated with increased stroke risk, recommend weight loss, diet and exercise as appropriate   Other Active Problems GIB, recent hematemesis, melena FOBT +  HGB 15.5 at recent discharge, 12.1 on admission  Hold Brilinta ? Endoscopic testing for localization of GIB NPO, PPI GI on board ? Possible sepsis with leukocytosis WBC 17.0 Questionable UA with large leukocytes, small bacteria. Pending culture.  Tachycardic on admission to 125, lactic acidosis to 1.8 24 hour TMAX 99.4 CXR with findings concerning for bronchitis  C. Diff panel pending Blood cultures pending Respiratory panel unremarkable  CKD stage IIIa Cr/BUN 1.61 / 48 > 1.56 / 50  Hospital day # 0  Lanae Boast, AGACNP-BC [facility] Pager: [phone]  ATTENDING NOTE: I reviewed above note and agree with the assessment and plan. Pt was seen and examined.   No family at the bedside. Pt lying in bed, no focal neuro deficit. Denies any diplopia, vertigo, N/V. Exam showed no nystagmus, eye disconjugate or ataxia. Pt was on  DAPT for recent BA stent, however, unfortunately developed GIB and cerebral peduncle ICH. Currently brilinta on hold. Continue ASA for stent patency. Given fresh stent, discussed with Dr. Corliss Skains, would like to consider lovenox once GI clearance. Close  neuro monitoring. Continue statin. BP and leukocytosis improved. No fever and Hb stable. Will follow.   For detailed assessment and plan, please refer to above/below as I have made changes wherever appropriate.   Marvel Plan, MD PhD Stroke Neurology 04/11/2023 6:23 PM  I spent  additional inpatient face-to-face time with the patient, more than 50% of which was spent in counseling and coordination of care, reviewing test results, images and medication, and discussing the diagnosis, treatment plan and potential prognosis. This patient's care requiresreview of multiple databases, neurological assessment, discussion with family, other specialists and medical decision making of high complexity.    To contact Stroke Continuity provider, please refer to [email or web address]. After hours, contact General Neurology

## 2023-04-11 NOTE — ED Notes (Signed)
Patient transported to CT 

## 2023-04-11 NOTE — Progress Notes (Signed)
Progress Note   Patient: Gavin Dorsey ZOX:[id number] DOB: January 23, 1961 DOA: 04/10/2023     0 DOS: the patient was seen and examined on 04/11/2023   Brief hospital course: 62 y.o. 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 Patient stated that he began taking Brilinta at time of the discharge 2 days ago. During my evaluation at the bedside patient reported that he has 3-4 episodes of loose stool in last 24 hours and also noticed black-colored stool for 3 to episodes as well.  Denies any bright red blood per rectum.  Patient denies any abdominal cramping and pain.  Denies any nausea, vomiting, fever and chill. Patient reported that since his last stroke he is still having some weakness of the left upper extremity but denies any generalized weakness, dysarthria and dysphagia. Patient denies any chest pain and shortness of breath.  While in ED, pt underwent MRI brain which was notable for acute intra-axial hemorrhage at the R cerebral peduncle, 8mm without significant mass effect. Neurology was consulted. GI was also consulted for concerns of GI bleed.  Assessment and Plan: GI bleed with acute blood loss anemia > Patient presenting with black-colored tarry stool and diarrhea 3-4 episodes .   - Presentation to ED patient found hypotensive, tachycardic and tachypneic.  Heart rate and blood pressure has been improved after 3.5 L of IV fluid resuscitation in the ED - Hemoglobin 12. 1.  Baseline hemoglobin around 16 PTA -Due to recent episodes of ischemic CVA and basilar  artery stent placement patient being has been started on aspirin and Brilinta 1 week ago.  Concern for GI bleed secondary to dual antiplatelet therapy -Started Protonix 40 mg IV twice daily. -GI consulted. Pt's recent stroke places him at increased risk from a sedation standpoint. If pt continues to demonstrate significant on-going bleeding despite BID PPI and carafate, EGD would be considered per GI -recheck cbc in AM   Sepsis with septic shock present on admit Bronchitis Diarrhea -Patient was hypotensive 89/66, tachycardic 125, Leukocytosis 17 and lactic acidosis 1.8..  Patient denies any fever, chill, abdominal pain, cough, chest pain and shortness of breath.  Respiratory panel unremarkable.  Patient noted to have diarrhea PTA -UA unremarkable -Chest CT is notable for bronchial thickening c/w bronchitis -Blood cx pending -Currently on IV vancomycin, cefepime and metronidazole. Will likely narrow coverage soon   Recent ischemic stroke Left vertebral and basilar artery stenosis status post basilar artery angioplasty 04/04/2023 on dual antiplatelet therapy aspirin and Brilinta ICH -Neurology following -was on brilnta with ASA PTA -Now on asa 81mg  daily. Per neuro, once cleared by GI, can consider additional Lovenox vs ticagrelor for stent patency   Grade 1 diastolic heart failure preserved EF 55 to 60% - Echo from 04/02/2023 showed grade 1 diastolic heart failure with preserved EF 55 to 60%. - Presented with soft bp, thus bp meds were held - Will ultimately plan to resume spironolactone and Lasix for the management of diastolic heart failure once more stable   Non-insulin-dependent DM type II -Currently patient is n.p.o. for now -cont SSI as needed   Hyperlipidemia -Continue Lipitor   Subjective: Feeling very tired this AM. Did  not sleep well overnight  Physical Exam: Vitals:   04/11/23 1545 04/11/23 1600 04/11/23 1615 04/11/23 1630  BP: (!) 118/96 127/75 134/71 130/73  Pulse:  77 81 78 79  Resp: 11 (!) 25 (!) 25 (!) 24  Temp:      TempSrc:      SpO2: 100% 100% 98% 97%  Weight:      Height:       General exam: Awake, laying in bed, in nad Respiratory system: Normal respiratory effort, no wheezing Cardiovascular system: regular rate, s1, s2 Gastrointestinal system: Soft, nondistended, positive BS Central nervous system: CN2-12 grossly intact, strength intact Extremities: Perfused, no clubbing Skin: Normal skin turgor, no notable skin lesions seen Psychiatry: Mood normal // no visual hallucinations   Data Reviewed:  Labs reviewed: Na 136, K 4.2, Cr 1.37   Family Communication: Pt in room, family not at bedside  Disposition: Status is: Inpatient Remains inpatient appropriate because: severity of illness  Planned Discharge Destination: Home     Author: Rickey Barbara, MD 04/11/2023 4:55 PM  For on call review [email or web address].

## 2023-04-11 NOTE — ED Notes (Signed)
Pt placed on Hospital Bed and given pillows - pt more comfortable

## 2023-04-11 NOTE — Plan of Care (Signed)
Overnight on-call note  Patient with recent revascularization of occluded proximal to mid basilar artery with stent assisted angioplasty done on 04/04/2023.  Loaded with Brilinta and started on dual antiplatelets with aspirin and Brilinta.  Now comes with melena and hemoglobin has dropped about 4 points in a week. Primary team holding Brilinta for now.  Okay to hold Brilinta for this dose but I fear that not being on dual antiplatelets will result in in-stent thrombosis and could be fatal. I would recommend close neuro monitoring as well as getting an MRI of the brain to ensure he has had no extension of his strokes as well as MRA of the head without contrast to assess for stent patency.  Continue his aspirin.  I would recommend resuming Brilinta as soon as medically possible.  Please reach out to the stroke team with questions as needed.  -- Milon Dikes, MD Neurologist [facility]

## 2023-04-11 NOTE — ED Notes (Signed)
Patient transported to MRI 

## 2023-04-11 NOTE — Consult Note (Signed)
Referring Provider: Darrick Grinder PA-C Primary Care Physician:  Simonne Martinet, MD Primary Gastroenterologist:  Gentry Fitz   Reason for Consultation: Anemia, melena   HPI: Gavin Dorsey is a 62 y.o. male with a past medical history of hypertension, hyperlipidemia, diabetes mellitus type 2 and recent acute CVA.  He was admitted to the the neuro ICU 04/04/2023 with an acute left cerebellar and brainstem infarcts.  Received ASA 325 mg and Plavix 300 mg in the ED.  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  He was subsequently discharged home 04/06/2023 on Brilinta 90 mg twice daily and ASA 81 mg daily.  He developed hematemesis and dark stools with generalized weakness which started around noon 04/10/2023 therefore he presented to the ED for further evaluation.  Persistent left-sided weakness since he was discharged from the hospital secondary to CVA as noted above.  Labs in the ED showed a WBC count of 17.0.  Hemoglobin 12.1 down from 15.5 on 04/05/2023.  Hematocrit 36.9.  Platelet 337.  Glucose 158.  BUN 71.  Creatinine 1.56 down from 1.61.  Albumin 3.0.  Total bili 0.6.  Alk phos 55.  AST 21.  ALT 21.  Lipase 29.  Urinalysis showed small urinary hemoglobin and large leukocytes.  Blood culture no growth < 12 hours.  SARS coronavirus 2 negative.  Influenza A and B negative.  FOBT positive.  Chest/abdominal/pelvic CT with contrast showed diffuse bronchial thickening consistent with bronchitis, questionable haziness at the pancreatic tail versus artifact due to beam hardening from patient's arm in the field, no pancreatic mass or ductal dilatation, fluid filled nondilated stomach, diverticulosis without evidence of diverticulitis,  4 cm aortic root and 4.1 cm mid ascending segment, mild prostate megaly and moderate spinal stenosis L4-5.  Brain MRI with evidence of acute  intra-axial hemorrhage at the right cerebral peduncle, proximally 8 mm, mild surrounding edema and no significant mass effect or intraventricular or extra-axial extension identified, otherwise expected evolution of brainstem and left cerebellar infarcts since 04/01/2023, improved basilar artery flow void since 04/01/2023, evidence of continued occlusion distal left vertebral artery and no other acute intracranial abnormality.  A GI consult was requested for further evaluation regarding upper GI bleed with hematemesis and melena.  He endorsed vomiting black emesis x 2 in past 2 melenic stools on Wednesday 12/25 and Thursday 12/26 then started passing black watery stools after he arrived to the ED.  No further hematemesis or melena overnight or thus far this morning.  He took a few tablets of Pepto-Bismol yesterday due to having indigestion.  He stated having intermittent indigestion since he was discharged from the hospital 5/22.  No prior history of GERD, stomach ulcers or GI bleed.  He denies having dysphagia. He was on ASA 81 mg daily prior to his hospitalization for acute CVA.  He infrequently takes Aleve as needed for headaches.  He last took ASA and Brilinta at 3 PM on 04/10/2023.  Non-smoker.  Rare alcohol use.  No drug use.  Never had an EGD or screening colonoscopy. Today CBC and CMP are pending.  No chest pain, cough or shortness of breath.  ECHO 04/02/2023 showed LVEF 55 to 60% and grade 1 diastolic dysfunction with mild dilatation of the ascending aorta.   Past Medical History:  Diagnosis Date   Diabetes mellitus without complication (HCC)    Hypertension     Past  Surgical History:  Procedure Laterality Date   IR ANGIO INTRA EXTRACRAN SEL COM CAROTID INNOMINATE BILAT MOD SED  04/02/2023   IR ANGIO VERTEBRAL SEL SUBCLAVIAN INNOMINATE UNI L MOD SED  04/02/2023   IR ANGIO VERTEBRAL SEL VERTEBRAL UNI R MOD SED  04/02/2023   IR ANGIO VERTEBRAL SEL VERTEBRAL UNI R MOD SED  04/04/2023   IR CT  HEAD LTD  04/04/2023   IR INTRA CRAN STENT  04/04/2023   IR US GUIDE VASC ACCESS RIGHT  04/02/2023   IR US GUIDE VASC ACCESS RIGHT  04/04/2023   RADIOLOGY WITH ANESTHESIA N/A 04/04/2023   Procedure: basilar artery angioplasty;  Surgeon: Julieanne Cotton, MD;  Location: [facility] OR;  Service: Radiology;  Laterality: N/A;    Prior to Admission medications   Medication Sig Start Date End Date Taking? Authorizing Provider  AMBULATORY NON FORMULARY MEDICATION Testosterone Cream 20%  Apply 0.5cc daily  Quantity: 30ml Refill 5  [facility] [address]. Jerlyn Ly [phone] 08/19/22   Riki Altes, MD  aspirin 81 MG chewable tablet Chew 1 tablet (81 mg total) by mouth daily. 04/07/23   de Saintclair Halsted, Cortney E, NP  diazepam (VALIUM) 5 MG tablet Take 5 mg by mouth 2 (two) times daily as needed for anxiety.    [provider]  rosuvastatin (CRESTOR) 40 MG tablet Take 1 tablet (40 mg total) by mouth daily. 04/07/23   de Saintclair Halsted, Cortney E, NP  tadalafil (CIALIS) 5 MG tablet Take 1 tablet (5 mg total) by mouth daily as needed for erectile dysfunction. 01/02/23   Stoioff, Verna Czech, MD  ticagrelor (BRILINTA) 90 MG TABS tablet Take 1 tablet (90 mg total) by mouth 2 (two) times daily. 04/06/23   de Saintclair Halsted, Cortney E, NP  ticagrelor (BRILINTA) 90 MG TABS tablet Take 1 tablet (90 mg total) by mouth 2 (two) times daily. 04/07/23   de Saintclair Halsted, Lennox Solders, NP    Current Facility-Administered Medications  Medication Dose Route Frequency Provider Last Rate Last Admin   0.9 %  sodium chloride infusion (Manually program via Guardrails IV Fluids)   Intravenous Once Sundil, Subrina, MD       0.9 %  sodium chloride infusion  250 mL Intravenous PRN Janalyn Shy, Subrina, MD       acetaminophen (TYLENOL) tablet 650 mg  650 mg Oral Q6H PRN Janalyn Shy, Subrina, MD       Or   acetaminophen (TYLENOL) suppository 650 mg  650 mg Rectal Q6H PRN Janalyn Shy, Subrina, MD        aspirin EC tablet 81 mg  81 mg Oral Daily Sundil, Subrina, MD   81 mg at 04/11/23 0920   ceFEPIme (MAXIPIME) 2 g in sodium chloride 0.9 % 100 mL IVPB  2 g Intravenous Q12H Stevphen Rochester, RPH 200 mL/hr at 04/11/23 0924 2 g at 04/11/23 2956   lactated ringers infusion   Intravenous Continuous Sundil, Subrina, MD 75 mL/hr at 04/11/23 0507 New Bag at 04/11/23 0507   metroNIDAZOLE (FLAGYL) IVPB 500 mg  500 mg Intravenous Q12H Sundil, Subrina, MD       ondansetron [facility]) tablet 4 mg  4 mg Oral Q6H PRN Janalyn Shy, Subrina, MD       Or   ondansetron [facility]) injection 4 mg  4 mg Intravenous Q6H PRN Janalyn Shy, Subrina, MD       pantoprazole (PROTONIX) injection 40 mg  40 mg Intravenous Q12H Sundil, Subrina, MD   40 mg  at 04/11/23 0920   rosuvastatin (CRESTOR) tablet 40 mg  40 mg Oral Daily Sundil, Subrina, MD   40 mg at 04/11/23 0920   sodium chloride flush (NS) 0.9 % injection 3 mL  3 mL Intravenous Q12H Sundil, Subrina, MD       sodium chloride flush (NS) 0.9 % injection 3 mL  3 mL Intravenous Q12H Sundil, Subrina, MD       sodium chloride flush (NS) 0.9 % injection 3 mL  3 mL Intravenous PRN Janalyn Shy, Subrina, MD       ticagrelor (BRILINTA) tablet 90 mg  90 mg Oral BID        ticagrelor (BRILINTA) tablet 90 mg  90 mg Oral BID        vancomycin (VANCOCIN) IVPB 1000 mg/200 mL premix  1,000 mg Intravenous Q24H Stevphen Rochester, RPH       Current Outpatient Medications  Medication Sig Dispense Refill   AMBULATORY NON FORMULARY MEDICATION Testosterone Cream 20%  Apply 0.5cc daily  Quantity: 30ml Refill 5  [facility] Dr. Laurell Josephs F2 [phone] 30 mL 5   aspirin 81 MG chewable tablet Chew 1 tablet (81 mg total) by mouth daily. 30 tablet 2   diazepam (VALIUM) 5 MG tablet Take 5 mg by mouth 2 (two) times daily as needed for anxiety.     rosuvastatin (CRESTOR) 40 MG tablet Take 1 tablet (40 mg total) by mouth daily. 30 tablet 2   tadalafil (CIALIS) 5  MG tablet Take 1 tablet (5 mg total) by mouth daily as needed for erectile dysfunction. 90 tablet 2   ticagrelor (BRILINTA) 90 MG TABS tablet Take 1 tablet (90 mg total) by mouth 2 (two) times daily. 60 tablet 2   ticagrelor (BRILINTA) 90 MG TABS tablet Take 1 tablet (90 mg total) by mouth 2 (two) times daily. 60 tablet 2    Allergies as of 04/10/2023   (No Known Allergies)    History reviewed. No pertinent family history.  Social History   Socioeconomic History   Marital status: Single    Spouse name: Not on file   Number of children: Not on file   Years of education: Not on file   Highest education level: Not on file  Occupational History   Not on file  Tobacco Use   Smoking status: Never   Smokeless tobacco: Never  Substance and Sexual Activity   Alcohol use: Yes   Drug use: Never   Sexual activity: Yes  Other Topics Concern   Not on file  Social History Narrative   Not on file   Social Drivers of Health   Financial Resource Strain: Not on file  Food Insecurity: No Food Insecurity (04/02/2023)   Hunger Vital Sign    Worried About Running Out of Food in the Last Year: Never true    Ran Out of Food in the Last Year: Never true  Transportation Needs: No Transportation Needs (04/02/2023)   PRAPARE - Administrator, Civil Service (Medical): No    Lack of Transportation (Non-Medical): No  Physical Activity: Not on file  Stress: Not on file  Social Connections: Not on file  Intimate Partner Violence: Not At Risk (04/02/2023)   Humiliation, Afraid, Rape, and Kick questionnaire    Fear of Current or Ex-Partner: No    Emotionally Abused: No    Physically Abused: No    Sexually Abused: No   Review of Systems: Gen: Denies fever, sweats  or chills. No weight loss.  CV: Denies chest pain, palpitations or edema. Resp: Denies cough, shortness of breath of hemoptysis.  GI: See HPI. GU : Denies urinary burning, blood in urine, increased urinary frequency or  incontinence. MS: Denies joint pain, muscles aches or weakness. Derm: Denies rash, itchiness, skin lesions or unhealing ulcers. Psych: Denies depression, anxiety, memory loss or confusion. Heme: Denies easy bruising, bleeding. Neuro: See HPI. Questionable left sided weakness in setting of recent CVA. Endo:   + DM type II.  Physical Exam: Vital signs in last 24 hours: Temp:  [98.7 F (37.1 C)-99.4 F (37.4 C)] 99 F (37.2 C) (12/27 0644) Pulse Rate:  [66-125] 88 (12/27 0900) Resp:  [19-29] 25 (12/27 0900) BP: (89-148)/(63-89) 119/77 (12/27 0900) SpO2:  [98 %-100 %] 100 % (12/27 0900) Weight:  [100.2 kg] 100.2 kg (12/26 2056)   General: Alert 62 year old male fatigued appearing in no acute distress. Head:  Normocephalic and atraumatic. Eyes:  No scleral icterus. Conjunctiva pink. Ears:  Normal auditory acuity. Nose:  No deformity, discharge or lesions. Mouth:  Dentition intact. No ulcers or lesions.  Neck:  Supple. No lymphadenopathy or thyromegaly.  Lungs: Breath sounds clear throughout. No wheezes, rhonchi or crackles.  Heart: Regular rate and rhythm, no murmurs. Abdomen: Soft, nondistended.  Nontender.  Positive bowel sounds to all 4 quadrants. Rectal: Deferred.  FOBT positive. Musculoskeletal:  Symmetrical without gross deformities.  Pulses:  Normal pulses noted. Extremities: No edema. Neurologic:  Alert and  oriented x 4. No focal deficits.  Skin:  Intact without significant lesions or rashes. Psych:  Alert and cooperative. Normal mood and affect.  Intake/Output from previous day: 12/26 0701 - 12/27 0700 In: 4603.6 [I.V.:612.2; IV Piggyback:3991.4] Out: -  Intake/Output this shift: Total I/O In: -  Out: 600 [Urine:600]  Lab Results: Recent Labs    04/10/23 2114  WBC 17.0*  HGB 12.1*  HCT 36.9*  PLT 337   BMET Recent Labs    04/10/23 2114  NA 137  K 4.4  CL 106  CO2 21*  GLUCOSE 158*  BUN 71*  CREATININE 1.56*  CALCIUM 8.8*   LFT Recent Labs     04/10/23 2114  PROT 6.4*  ALBUMIN 3.0*  AST 21  ALT 21  ALKPHOS 55  BILITOT 0.6   PT/INR No results for input(s): "LABPROT", "INR" in the last 72 hours. Hepatitis Panel No results for input(s): "HEPBSAG", "HCVAB", "HEPAIGM", "HEPBIGM" in the last 72 hours.    Studies/Results: CT HEAD WO CONTRAST ( ) Result Date: 04/11/2023 CLINICAL DATA:  Provided history: Weakness. EXAM: CT HEAD WITHOUT CONTRAST TECHNIQUE: Contiguous axial images were obtained from the base of the skull through the vertex without intravenous contrast. RADIATION DOSE REDUCTION: This exam was performed according to the departmental dose-optimization program which includes automated exposure control, adjustment of the mA and/or kV according to patient size and/or use of iterative reconstruction technique. COMPARISON:  Brain MRI 04/11/2023. Non-contrast head CT and CT angiogram head/neck 04/01/2023. FINDINGS: Brain: Mild generalized cerebral atrophy. Known 8 mm acute parenchymal hemorrhage at the right thalamocapsular junction/right cerebral peduncle. Mild surrounding edema, unchanged. Known acute/subacute infarcts within the brainstem, left middle cerebellar peduncle and left cerebellar hemisphere, occult by CT and better appreciated on the brain MRI performed earlier today. Known small chronic cortical infarct within the left occipital lobe. Patchy and ill-defined hypoattenuation within the cerebral white matter, nonspecific but compatible with mild chronic small vessel ischemic disease. No extra-axial fluid collection. No evidence of an intracranial mass.  No midline shift. Vascular: No hyperdense vessel. Vascular stent within the basilar artery. Atherosclerotic calcifications. Skull: No calvarial fracture or aggressive osseous lesion. Sinuses/Orbits: No mass or acute finding within the imaged orbits. Minimal mucosal thickening scattered within the paranasal sinuses. IMPRESSION: 1. Known 8 mm acute parenchymal hemorrhage at the  right thalamocapsular junction/right cerebral peduncle. Mild surrounding edema, unchanged. 2. Known acute/subacute infarcts within the brainstem, left middle cerebellar peduncle and left cerebellar hemisphere, occult by CT and better appreciated on the brain MRI performed earlier today. 3. 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 Electronically Signed   By: Jackey Loge D.O.   On: 04/11/2023 09:01   MR BRAIN WO CONTRAST Addendum Date: 04/11/2023 ADDENDUM REPORT: 04/11/2023 06:47 ADDENDUM: MRI and MRA findings were discussed by telephone with Dr. Tereasa Coop on 04/11/2023 1610 hours. Electronically Signed   By: Odessa Fleming M.D.   On: 04/11/2023 06:47   Result Date: 04/11/2023 CLINICAL DATA:  61 year old male with scattered brainstem and left cerebellar hemisphere infarcts earlier this month, left vertebral artery occlusion, severe basilar artery stenosis/pre-occlusion status post stent assisted Basilar angioplasty on 04/04/2023. Neurologic deficit. EXAM: MRI HEAD WITHOUT CONTRAST TECHNIQUE: Multiplanar, multiecho pulse sequences of the brain and surrounding structures were obtained without intravenous contrast. COMPARISON:  Brain MRI 04/01/2023. Intracranial MRA today reported separately. FINDINGS: Brain: Expected evolution of scattered, patchy brainstem and left cerebellar hemisphere infarcts since 04/01/2023. New DWI susceptibility at the right cerebral peduncle with surrounding patchy T2 and FLAIR hyperintensity (series 7, image 17, series 8, image 45) compatible with intra-axial hemorrhage estimated at about 8 mm diameter. No significant mass effect. No other new diffusion abnormality when compared to 04/01/2023. No other intracranial hemorrhage identified. No IVH or ventriculomegaly. Stable other supratentorial gray and white matter signal, widely scattered nonspecific white matter T2 and FLAIR hyperintensity. An small  area of left superior occipital pole encephalomalacia. No midline shift or intracranial mass effect. Basilar cisterns remain normal. Negative pituitary and cervicomedullary junction. Vascular: Continued absence of the distal left vertebral artery flow void. But improved basilar artery flow void since 04/01/2023. Other Major intracranial vascular flow voids are preserved. Skull and upper cervical spine: Stable, negative. Sinuses/Orbits: Stable, negative. Other: Mastoids remain well aerated. Grossly normal visible internal auditory structures. Stable visible scalp and face. IMPRESSION: 1. MRI evidence of acute intra-axial hemorrhage at the right cerebral peduncle, proximally 8 mm. Mild surrounding edema. No significant mass effect. No intraventricular or extra-axial extension identified. 2. Otherwise expected evolution of brainstem and left cerebellar infarcts since 04/01/2023. No other acute intracranial abnormality. 3. Improved basilar artery flow void since 04/01/2023, evidence of continued occlusion distal left vertebral artery. See MRA today reported separately. Electronically Signed: By: Odessa Fleming M.D. On: 04/11/2023 06:21   MR ANGIO HEAD WO CONTRAST Result Date: 04/11/2023 CLINICAL DATA:  62 year old male with scattered brainstem and left cerebellar hemisphere infarcts earlier this month, left vertebral artery occlusion, severe basilar artery stenosis/pre-occlusion status post stent assisted Basilar angioplasty on 04/04/2023. Neurologic deficit. EXAM: MRA HEAD WITHOUT CONTRAST TECHNIQUE: Angiographic images of the Circle of Willis were acquired using MRA technique without intravenous contrast. COMPARISON:  Brain MRI today. Pretreatment CTA head and neck 04/01/2023. FINDINGS: Anterior circulation: Tortuous distal cervical ICAs and antegrade flow signal throughout both ICA siphons. Ophthalmic artery origins appear normal. Mild to moderate narrowing of the supraclinoid ICAs appears stable. Patent carotid  termini, MCA and ACA origins. Visible ACA and MCA branches appears stable. Posterior circulation:  No distal left vertebral artery antegrade flow signal. Antegrade flow in the distal right vertebral artery appears stable to the vertebrobasilar junction. Right PICA origin is patent. New proximal basilar artery susceptibility artifact compatible with new vascular stent (series 3, image 37) with maintained antegrade flow in the distal basilar artery indicating stent patency. SCA and PCA origins are patent. Posterior communicating arteries are diminutive or absent. Bilateral PCA branches appear improved since 04/01/2023. Anatomic variants: None significant. Other: Subtle new susceptibility artifact at the right midbrain, cerebral peduncle. See MRI today reported separately. IMPRESSION: 1. Unchanged distal left vertebral artery occlusion. Distal right vertebral artery remains patent. Patent proximal Basilar artery stent and improved distal Basilar, bilateral SCA and PCA flow related enhancement since 03/31/2061. 2. Stable anterior circulation, mild to moderate bilateral supraclinoid ICA atherosclerotic stenosis. 3. Right cerebral peduncle hemorrhage on MRI today is reported separately. Electronically Signed   By: Odessa Fleming M.D.   On: 04/11/2023 06:26   CT CHEST ABDOMEN PELVIS W CONTRAST Result Date: 04/11/2023 CLINICAL DATA:  Dark stools, hematemesis and generalized weakness left upper extremity. Being evaluated for sepsis. Checking for source. 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 EXAM: CT CHEST, ABDOMEN, AND PELVIS WITH CONTRAST TECHNIQUE: Multidetector CT imaging of the chest, abdomen and pelvis was performed following the standard protocol during bolus administration of intravenous contrast. RADIATION DOSE REDUCTION: This exam was performed according to the departmental dose-optimization program which includes automated exposure control, adjustment of  the mA and/or kV according to patient size and/or use of iterative reconstruction technique. CONTRAST:  75mL OMNIPAQUE IOHEXOL 350 MG/ML SOLN COMPARISON:  No recent chest x-ray. Comparison is made with the abdomen and pelvis CT with contrast 11/12/2021 and a chest CT with contrast 09/11/2006. FINDINGS: CT CHEST FINDINGS Cardiovascular: The cardiac size is normal. There is no pericardial effusion. The pulmonary arteries and veins are normal in caliber. The pulmonary arteries are centrally clear. Small amount of scattered aortic and great vessel atherosclerosis. There is no stenosis or dissection. The aortic root measures 4.0 cm at the sinuses of Valsalva on 6:107. The mid ascending segment 4.1 cm on 6:111. Annual follow-up imaging recommended. Mediastinum/Nodes: No enlarged mediastinal, hilar, or axillary lymph nodes. The lower poles of the thyroid gland, trachea, and esophagus demonstrate no significant findings. Lungs/Pleura: There is diffuse bronchial thickening. There is no consolidation, effusion, pneumothorax or visible pulmonary nodule. 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 No acute or other significant osseous findings. Unremarkable chest wall. CT ABDOMEN PELVIS FINDINGS Hepatobiliary: No focal liver abnormality is seen. No calcified gallstones, gallbladder wall thickening, or biliary dilatation. Pancreas: Questionable haziness at the pancreatic tail versus artifact due to beam hardening from the patient's arms in the field. Correlate with serum lipase for possible significance. There is no pancreatic mass enhancement or ductal dilatation, no other focal abnormality. Spleen: No abnormality. Adrenals/Urinary Tract: Adrenal glands are unremarkable. Kidneys are normal, without renal calculi, focal lesion, or hydronephrosis. Bladder is unremarkable. Stomach/Bowel: There is fluid filling of the nondilated stomach. Normal wall thickness. The unopacified small  bowel is normal caliber without mesenteric inflammatory change. The appendix is normal. There is scattered colonic diverticulosis without evidence of diverticulitis. Vascular/Lymphatic: Aortic atherosclerosis. No enlarged abdominal or pelvic lymph nodes. Reproductive: There is mild prostatomegaly, stable. Other: No abdominal wall hernia or abnormality. No abdominopelvic ascites. There is no free hemorrhage or free air. Musculoskeletal: Facet hypertrophy and spondylosis lumbar spine. No acute  or other significant osseous findings. Mild hip DJD. Moderate acquired spinal stenosis L4-5. IMPRESSION: 1. Questionable haziness at the pancreatic tail versus artifact due to beam hardening from the patient's arms in the field. Correlate with serum lipase for possible significance. 2. Bronchitis without evidence of pneumonia. 3. Aortic atherosclerosis. 4. 4.0 cm aortic root and 4.1 cm mid ascending segment. Recommend annual imaging followup by CTA or MRA. This recommendation follows 2010 ACCF/AHA/AATS/ACR/ASA/ SCA/SCAI/SIR/STS/SVM Guidelines for the Diagnosis and Management of Patients with Thoracic Aortic Disease. Circulation. 2010; 121: Q034-V425. Aortic aneurysm NOS (ICD10-I71.9). 5. Diverticulosis without evidence of diverticulitis. 6. Mild prostatomegaly. 7. Moderate acquired spinal stenosis L4-5. Aortic Atherosclerosis (ICD10-I70.0). Electronically Signed   By: Almira Bar M.D.   On: 04/11/2023 01:47    IMPRESSION/PLAN:  62 year old male on Brilinta and aspirin secondary to recent CVA presenting with upper GI bleed with hematemesis and melena.  No abdominal pain. Admission hemoglobin 12.1 down from 15.5 on 12/21.  No active hematemesis or melena overnight or thus far this morning. -NPO -Pantoprazole 40 mg IV twice daily -Continue to hold Brilinta and ASA -Transfuse for hemoglobin level less than 8 or as needed if symptomatic -Defer endoscopic recommendations to Dr. 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 Brain MRI with evidence of acute intra-axial hemorrhage at the right cerebral peduncle, proximally 8 mm, mild surrounding edema and no significant mass effect or intraventricular or extra-axial extension identified, otherwise expected evolution of brainstem and left cerebellar infarcts since 04/01/2023, improved basilar artery flow void since 04/01/2023, evidence of continued occlusion distal left vertebral artery and no other acute intracranial abnormality.  Seen by neurology who assessed his intracerebral hemorrhage likely secondary to dual antiplatelet use, AC on hold secondary to GI bleed with recommendations to continue aspirin and consider Lovenox. -Neurology following closely  Leukocytosis with possible sepsis, secondary to bronchitis versus ? UTI.  On IV antibiotics. -Management per the hospitalist  Diarrhea, onset 12/26 -GI pathogen panel and C. difficile testing ordered by the hospitalist, not yet completed as patient has not had any further BM overnight or thus far today      Arnaldo Natal  04/11/2023, 10:55AM

## 2023-04-12 DIAGNOSIS — J40 Bronchitis, not specified as acute or chronic: Secondary | ICD-10-CM

## 2023-04-12 DIAGNOSIS — T45525A Adverse effect of antithrombotic drugs, initial encounter: Secondary | ICD-10-CM

## 2023-04-12 LAB — COMPREHENSIVE METABOLIC PANEL
ALT: 17 U/L (ref 0–44)
AST: 18 U/L (ref 15–41)
Albumin: 2.3 g/dL — ABNORMAL LOW (ref 3.5–5.0)
Alkaline Phosphatase: 45 U/L (ref 38–126)
Anion gap: 5 (ref 5–15)
BUN: 27 mg/dL — ABNORMAL HIGH (ref 8–23)
CO2: 26 mmol/L (ref 22–32)
Calcium: 7.9 mg/dL — ABNORMAL LOW (ref 8.9–10.3)
Chloride: 107 mmol/L (ref 98–111)
Creatinine, Ser: 1.25 mg/dL — ABNORMAL HIGH (ref 0.61–1.24)
GFR, Estimated: >60 mL/min (ref >60)
Glucose, Bld: 95 mg/dL (ref 70–99)
Potassium: 3.8 mmol/L (ref 3.5–5.1)
Sodium: 138 mmol/L (ref 135–145)
Total Bilirubin: 0.7 mg/dL (ref <1.2)
Total Protein: 5 g/dL — ABNORMAL LOW (ref 6.5–8.1)

## 2023-04-12 LAB — CBC
HCT: 24.1 % — ABNORMAL LOW (ref 39.0–52.0)
Hemoglobin: 8 g/dL — ABNORMAL LOW (ref 13.0–17.0)
MCH: 28.5 pg (ref 26.0–34.0)
MCHC: 33.2 g/dL (ref 30.0–36.0)
MCV: 85.8 fL (ref 80.0–100.0)
Platelets: 206 10*3/uL (ref 150–400)
RBC: 2.81 MIL/uL — ABNORMAL LOW (ref 4.22–5.81)
RDW: 16.6 % — ABNORMAL HIGH (ref 11.5–15.5)
WBC: 10.3 10*3/uL (ref 4.0–10.5)
nRBC: 0 % (ref 0.0–0.2)

## 2023-04-12 LAB — HEMOGLOBIN AND HEMATOCRIT, BLOOD
HCT: 23.9 % — ABNORMAL LOW (ref 39.0–52.0)
HCT: 23 % — ABNORMAL LOW (ref 39.0–52.0)
Hemoglobin: 8 g/dL — ABNORMAL LOW (ref 13.0–17.0)
Hemoglobin: 7.6 g/dL — ABNORMAL LOW (ref 13.0–17.0)

## 2023-04-12 LAB — GLUCOSE, CAPILLARY
Glucose-Capillary: 83 mg/dL (ref 70–99)
Glucose-Capillary: 87 mg/dL (ref 70–99)
Glucose-Capillary: 97 mg/dL (ref 70–99)
Glucose-Capillary: 109 mg/dL — ABNORMAL HIGH (ref 70–99)
Glucose-Capillary: 109 mg/dL — ABNORMAL HIGH (ref 70–99)

## 2023-04-12 LAB — HEPARIN LEVEL (UNFRACTIONATED): Heparin Unfractionated: <0.1 [IU]/mL — ABNORMAL LOW (ref 0.30–0.70)

## 2023-04-12 MED ORDER — SODIUM CHLORIDE 0.9 % IV SOLN
500.0000 mg | INTRAVENOUS | Status: AC
  Administered 2023-04-12 – 2023-04-15 (×4): 500 mg via INTRAVENOUS
  Filled 2023-04-12 (×4): qty 5

## 2023-04-12 MED ORDER — HEPARIN (PORCINE) 25000 UT/250ML-% IV SOLN
900.0000 [IU]/h | INTRAVENOUS | Status: DC
  Administered 2023-04-12: 800 [IU]/h via INTRAVENOUS
  Administered 2023-04-13: 900 [IU]/h via INTRAVENOUS
  Filled 2023-04-12 (×3): qty 250

## 2023-04-12 MED ORDER — VANCOMYCIN HCL 1250 MG/250ML IV SOLN: 1250.0000 mg | INTRAVENOUS | Status: DC

## 2023-04-12 MED ORDER — SUCRALFATE 1 GM/10ML PO SUSP
1.0000 g | Freq: Three times a day (TID) | ORAL | Status: DC
Start: 2023-04-12 — End: 2023-04-16
  Administered 2023-04-12 – 2023-04-16 (×15): 1 g via ORAL
  Filled 2023-04-12 (×14): qty 10

## 2023-04-12 MED ORDER — SODIUM CHLORIDE 0.9 % IV SOLN
2.0000 g | INTRAVENOUS | Status: DC
  Administered 2023-04-12: 2 g via INTRAVENOUS
  Filled 2023-04-12: qty 20

## 2023-04-12 NOTE — Care Management (Signed)
Referral for PT and OT made to [facility], patient referred there previously as well.

## 2023-04-12 NOTE — Progress Notes (Signed)
PROGRESS NOTE                                                                                                                                                                                                             Patient Demographics:    Gavin Dorsey, is a 62 y.o. male, DOB - 02-09-1961, MVH:[id number]  Outpatient Primary MD for the patient is Simonne Martinet, MD    LOS - 1  Admit date - 04/10/2023    Chief Complaint  Patient presents with   Weakness       Brief Narrative (HPI from H&P)   62 y.o. 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  Patient stated that he began taking Brilinta at time of the discharge 2 days ago. During my evaluation at the bedside patient reported that he has 3-4 episodes of loose stool in last 24 hours and also noticed black-colored stool for 3 to episodes as well.    Further workup suggested upper GI bleed, acute blood loss related anemia, sepsis acute bronchitis and sepsis and he was admitted to the hospital.   Subjective:    Gavin Dorsey today has, No headache, No chest pain, No abdominal pain - No Nausea, No new weakness tingling or numbness, no cough or shortness of breath.   Assessment  & Plan :    Acute upper GI bleed related blood loss anemia.  Likely bled due to DAPT he received after recent stenting, currently on PPI, clear liquids, monitoring CBC closely, agreeable for transfusion if needed, case discussed with GI and neurology both in detail  on 04/12/2023.  If there is a significant further drop in H&H then likely EGD in the next day or 2, cautiously place him on nonbullous heparin drip and monitor.   Acute and subacute brainstem and left cerebellar hemisphere infarctions 04/01/23 s/p basilar angioplasty and stenting with discharge on 04/06/23 on aspirin and Brilinta - now MRI brain showing evidence of acute intra-axial hemorrhage, also has evidence of acute  upper GI bleed.  This discussed with stroke team Dr. Roda Shutters, interventional radiologist Dr. Corliss Skains and GI physician Dr. Tomasa Rand.  Plan is to continue aspirin and heparin nonbullous drip with caution and monitor.  Requested neurology to monitor the patient throughout his hospital stay so that we can have right medications upon discharge and his hospital stay in the light of recent intracranial stenting and now complicated with bleed.  Sepsis and acute bronchitis upon admission.  Will taper down antibiotics to Rocephin and azithromycin, follow cultures, sepsis pathophysiology seems to have stabilized.  AKI present on admission.  Improved.  Hydrate and transfuse as needed.  Dyslipidemia.  On statin.  Chronic diastolic CHF EF 60% on recent echo.  Compensated.  DM type II.  ISS.  CBG (last 3)  Recent Labs    04/11/23 1900 04/12/23 0537 04/12/23 0844  GLUCAP 78 83 87   Lab Results  Component Value Date   HGBA1C 5.2 04/01/2023        Condition - Extremely Guarded  Family Communication  :    Code Status :  Full  Consults  :  Neuro, GI  PUD Prophylaxis : PPI   Procedures  :     MRI brain.  1. MRI evidence of acute intra-axial hemorrhage at the right cerebral peduncle, proximally 8 mm. Mild surrounding edema. No significant mass effect. No intraventricular or extra-axial extension identified. 2. Otherwise expected evolution of brainstem and left cerebellar infarcts since 04/01/2023. No other acute intracranial abnormality. 3. Improved basilar artery flow void since  04/01/2023, evidence of continued occlusion distal left vertebral artery. See MRA today reported separately  MRA Brain - 1. Unchanged distal left vertebral artery occlusion. Distal right vertebral artery remains patent. Patent proximal Basilar artery stent and improved distal Basilar, bilateral SCA and PCA flow related enhancement since 03/31/2061. 2. Stable anterior circulation, mild to moderate bilateral supraclinoid ICA atherosclerotic stenosis. 3. Right cerebral peduncle hemorrhage on MRI today is reported separately.  CT abdomen pelvis  1. Questionable haziness at the pancreatic tail versus artifact due to beam hardening from the patient's arms in the field. Correlate with serum lipase for possible significance. 2. Bronchitis without evidence of pneumonia. 3. Aortic atherosclerosis. 4. 4.0 cm aortic root and 4.1 cm mid ascending segment. Recommend annual imaging followup by CTA or MRA. This recommendation follows 2010 ACCF/AHA/AATS/ACR/ASA/ SCA/SCAI/SIR/STS/SVM Guidelines for the Diagnosis and Management of Patients with Thoracic Aortic Disease. Circulation. 2010; 121: Y403-K742. Aortic aneurysm NOS (ICD10-I71.9). 5. Diverticulosis without evidence of diverticulitis. 6. Mild prostatomegaly. 7. Moderate acquired spinal stenosis L4-5. Aortic Atherosclerosis (ICD10-I70.0)      Disposition Plan  :    Status is: Inpatient  DVT Prophylaxis  :    SCDs Start: 04/11/23 0356 Place TED hose Start: 04/11/23 0356     Lab Results  Component Value Date   PLT 206 04/12/2023    Diet :  Diet Order             Diet full liquid Room service appropriate? Yes; Fluid consistency: Thin  Diet effective now                    Inpatient Medications  Scheduled Meds:  sodium chloride   Intravenous Once   aspirin EC  81 mg Oral Daily   pantoprazole (PROTONIX) IV  40 mg Intravenous Q12H   rosuvastatin  40 mg Oral Daily   sodium chloride flush  3 mL Intravenous Q12H   Continuous Infusions:   ceFEPime (MAXIPIME) IV 2  g (04/12/23 1034)   heparin     metronidazole 500 mg (04/11/23 2310)   vancomycin     PRN Meds:.acetaminophen **OR** acetaminophen, ondansetron **OR** ondansetron (ZOFRAN) IV    Objective:   Vitals:   04/11/23 2100 04/11/23 2124 04/12/23 0000 04/12/23 0400  BP:  111/71    Pulse: 80 78    Resp: (!) 21 18 18 18   Temp:  98.4 F (36.9 C)  98 F (36.7 C)  TempSrc:  Oral  Oral  SpO2: 100% 97%    Weight:  102.1 kg    Height:        Wt Readings from Last 3 Encounters:  04/11/23 102.1 kg  04/01/23 100.9 kg  04/01/23 115.7 kg     Intake/Output Summary (Last 24 hours) at 04/12/2023 1043 Last data filed at 04/12/2023 0534 Gross per 24 hour  Intake 489.44 ml  Output 550 ml  Net -60.56 ml     Physical Exam  Awake Alert, No new F.N deficits, Normal affect [place].AT,PERRAL Supple Neck, No JVD,   Symmetrical Chest wall movement, Good air movement bilaterally, CTAB RRR,No Gallops,Rubs or new Murmurs,  +ve B.Sounds, Abd Soft, No tenderness,   No Cyanosis, Clubbing or edema        Data Review:    Recent Labs  Lab 04/10/23 2114 04/11/23 1708 04/12/23 0311  WBC 17.0* 10.6* 10.3  HGB 12.1* 8.3* 8.0*  HCT 36.9* 25.4* 24.1*  PLT 337 222 206  MCV 85.6 87.3 85.8  MCH 28.1 28.5 28.5  MCHC 32.8 32.7 33.2  RDW 16.4* 16.6* 16.6*  LYMPHSABS 2.0  --   --   MONOABS 1.6*  --   --   EOSABS 0.2  --   --   BASOSABS 0.1  --   --     Recent Labs  Lab 04/10/23 2114 04/11/23 0238 04/11/23 0437 04/11/23 1517 04/12/23 0311  NA 137  --   --  136 138  K 4.4  --   --  4.2 3.8  CL 106  --   --  104 107  CO2 21*  --   --  23 26  ANIONGAP 10  --   --  9 5  GLUCOSE 158*  --   --  112* 95  BUN 71*  --   --  60* 27*  CREATININE 1.56*  --   --  1.37* 1.25*  AST 21  --   --  21 18  ALT 21  --   --  19 17  ALKPHOS 55  --   --  57 45  BILITOT 0.6  --   --  0.7 0.7  ALBUMIN 3.0*  --   --  2.9* 2.3*  LATICACIDVEN  --  1.8 1.7  --   --   CALCIUM 8.8*  --   --   8.5* 7.9*      Recent Labs  Lab 04/10/23 2114 04/11/23 0238 04/11/23 0437 04/11/23 1517 04/12/23 0311  LATICACIDVEN  --  1.8 1.7  --   --   CALCIUM 8.8*  --   --  8.5* 7.9*    --------------------------------------------------------------------------------------------------------------- Lab Results  Component Value Date   CHOL 148 04/02/2023   HDL 27 (L) 04/02/2023   LDLCALC 101 (H) 04/02/2023   TRIG 96 04/06/2023   CHOLHDL 5.5 04/02/2023    Lab Results  Component Value Date   HGBA1C 5.2 04/01/2023   Radiology Reports CT HEAD WO CONTRAST ( ) Result Date: 04/11/2023 CLINICAL DATA:  Provided history: Weakness. EXAM: CT HEAD WITHOUT CONTRAST TECHNIQUE: Contiguous axial images were obtained from the base of the skull through the vertex without intravenous contrast. RADIATION DOSE REDUCTION: This exam was performed according to the departmental dose-optimization program which includes automated exposure control, adjustment of the mA and/or kV according to patient size and/or use of iterative reconstruction technique. COMPARISON:  Brain MRI 04/11/2023. Non-contrast head CT and CT angiogram head/neck 04/01/2023. FINDINGS: Brain: Mild generalized cerebral atrophy. Known 8 mm acute parenchymal hemorrhage at the right thalamocapsular junction/right cerebral peduncle. Mild surrounding edema, unchanged. Known acute/subacute infarcts within the brainstem, left middle cerebellar peduncle and left cerebellar hemisphere, occult by CT and better appreciated on the brain MRI performed earlier today. Known small chronic cortical infarct within the left occipital lobe. Patchy and ill-defined hypoattenuation within the cerebral white matter, nonspecific but compatible with mild chronic small vessel ischemic disease. No extra-axial fluid collection. No evidence of an intracranial mass. No midline shift. Vascular: No hyperdense vessel. Vascular stent within the basilar artery. Atherosclerotic  calcifications. Skull: No calvarial fracture or aggressive osseous lesion. Sinuses/Orbits: No mass or acute finding within the imaged orbits. Minimal mucosal thickening scattered within the paranasal sinuses. IMPRESSION: 1. Known 8 mm acute parenchymal hemorrhage at the right thalamocapsular junction/right cerebral peduncle. Mild surrounding edema, unchanged. 2. Known acute/subacute infarcts within the brainstem, left middle cerebellar peduncle and left cerebellar hemisphere, occult by CT and better appreciated on the brain MRI performed earlier today. 3. 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 Electronically Signed   By: Jackey Loge D.O.   On: 04/11/2023 09:01   MR BRAIN WO CONTRAST Addendum Date: 04/11/2023 ADDENDUM REPORT: 04/11/2023 06:47 ADDENDUM: MRI and MRA findings were discussed by telephone with Dr. Tereasa Coop on 04/11/2023 4132 hours. Electronically Signed   By: Odessa Fleming M.D.   On: 04/11/2023 06:47   Result Date: 04/11/2023 CLINICAL DATA:  62 year old male with scattered brainstem and left cerebellar hemisphere infarcts earlier this month, left vertebral artery occlusion, severe basilar artery stenosis/pre-occlusion status post stent assisted Basilar angioplasty on 04/04/2023. Neurologic deficit. EXAM: MRI HEAD WITHOUT CONTRAST TECHNIQUE: Multiplanar, multiecho pulse sequences of the brain and surrounding structures were obtained without intravenous contrast. COMPARISON:  Brain MRI 04/01/2023. Intracranial MRA today reported separately. FINDINGS: Brain: Expected evolution of scattered, patchy brainstem and left cerebellar hemisphere infarcts since 04/01/2023. New DWI susceptibility at the right cerebral peduncle with surrounding patchy T2 and FLAIR hyperintensity (series 7, image 17, series 8, image 45) compatible with intra-axial hemorrhage estimated at about 8 mm diameter. No significant mass effect. No  other new diffusion abnormality when compared to 04/01/2023. No other intracranial hemorrhage identified. No IVH or ventriculomegaly. Stable other supratentorial gray and white matter signal, widely scattered nonspecific white matter T2 and FLAIR hyperintensity. An small area of left superior occipital pole encephalomalacia. No midline shift or intracranial mass effect. Basilar cisterns remain normal. Negative pituitary and cervicomedullary junction. Vascular: Continued absence of the distal left vertebral artery flow void. But improved basilar artery flow void since 04/01/2023. Other Major intracranial vascular flow voids are preserved. Skull and upper cervical spine: Stable, negative. Sinuses/Orbits: Stable, negative. Other: Mastoids remain well aerated. Grossly normal visible internal auditory structures. Stable visible scalp and face. IMPRESSION: 1. MRI evidence of acute intra-axial hemorrhage at the right cerebral peduncle, proximally 8 mm. Mild surrounding edema. No significant mass effect. No intraventricular or extra-axial extension identified. 2. Otherwise expected evolution of brainstem and left cerebellar  infarcts since 04/01/2023. No other acute intracranial abnormality. 3. Improved basilar artery flow void since 04/01/2023, evidence of continued occlusion distal left vertebral artery. See MRA today reported separately. Electronically Signed: By: Odessa Fleming M.D. On: 04/11/2023 06:21   MR ANGIO HEAD WO CONTRAST Result Date: 04/11/2023 CLINICAL DATA:  62 year old male with scattered brainstem and left cerebellar hemisphere infarcts earlier this month, left vertebral artery occlusion, severe basilar artery stenosis/pre-occlusion status post stent assisted Basilar angioplasty on 04/04/2023. Neurologic deficit. EXAM: MRA HEAD WITHOUT CONTRAST TECHNIQUE: Angiographic images of the Circle of Willis were acquired using MRA technique without intravenous contrast. COMPARISON:  Brain MRI today. Pretreatment CTA  head and neck 04/01/2023. FINDINGS: Anterior circulation: Tortuous distal cervical ICAs and antegrade flow signal throughout both ICA siphons. Ophthalmic artery origins appear normal. Mild to moderate narrowing of the supraclinoid ICAs appears stable. Patent carotid termini, MCA and ACA origins. Visible ACA and MCA branches appears stable. Posterior circulation: No distal left vertebral artery antegrade flow signal. Antegrade flow in the distal right vertebral artery appears stable to the vertebrobasilar junction. Right PICA origin is patent. New proximal basilar artery susceptibility artifact compatible with new vascular stent (series 3, image 37) with maintained antegrade flow in the distal basilar artery indicating stent patency. SCA and PCA origins are patent. Posterior communicating arteries are diminutive or absent. Bilateral PCA branches appear improved since 04/01/2023. Anatomic variants: None significant. Other: Subtle new susceptibility artifact at the right midbrain, cerebral peduncle. See MRI today reported separately. IMPRESSION: 1. Unchanged distal left vertebral artery occlusion. Distal right vertebral artery remains patent. Patent proximal Basilar artery stent and improved distal Basilar, bilateral SCA and PCA flow related enhancement since 03/31/2061. 2. Stable anterior circulation, mild to moderate bilateral supraclinoid ICA atherosclerotic stenosis. 3. Right cerebral peduncle hemorrhage on MRI today is reported separately. Electronically Signed   By: Odessa Fleming M.D.   On: 04/11/2023 06:26   CT CHEST ABDOMEN PELVIS W CONTRAST Result Date: 04/11/2023 CLINICAL DATA:  Dark stools, hematemesis and generalized weakness left upper extremity. Being evaluated for sepsis. Checking for source. 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 EXAM: CT CHEST, ABDOMEN, AND PELVIS WITH CONTRAST TECHNIQUE: Multidetector CT imaging of the chest, abdomen and  pelvis was performed following the standard protocol during bolus administration of intravenous contrast. RADIATION DOSE REDUCTION: This exam was performed according to the departmental dose-optimization program which includes automated exposure control, adjustment of the mA and/or kV according to patient size and/or use of iterative reconstruction technique. CONTRAST:  75mL OMNIPAQUE IOHEXOL 350 MG/ML SOLN COMPARISON:  No recent chest x-ray. Comparison is made with the abdomen and pelvis CT with contrast 11/12/2021 and a chest CT with contrast 09/11/2006. FINDINGS: CT CHEST FINDINGS Cardiovascular: The cardiac size is normal. There is no pericardial effusion. The pulmonary arteries and veins are normal in caliber. The pulmonary arteries are centrally clear. Small amount of scattered aortic and great vessel atherosclerosis. There is no stenosis or dissection. The aortic root measures 4.0 cm at the sinuses of Valsalva on 6:107. The mid ascending segment 4.1 cm on 6:111. Annual follow-up imaging recommended. Mediastinum/Nodes: No enlarged mediastinal, hilar, or axillary lymph nodes. The lower poles of the thyroid gland, trachea, and esophagus demonstrate no significant findings. Lungs/Pleura: There is diffuse bronchial thickening. There is no consolidation, effusion, pneumothorax or visible pulmonary nodule. 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 No acute or other significant osseous findings. Unremarkable chest wall.  CT ABDOMEN PELVIS FINDINGS Hepatobiliary: No focal liver abnormality is seen. No calcified gallstones, gallbladder wall thickening, or biliary dilatation. Pancreas: Questionable haziness at the pancreatic tail versus artifact due to beam hardening from the patient's arms in the field. Correlate with serum lipase for possible significance. There is no pancreatic mass enhancement or ductal dilatation, no other focal abnormality. Spleen: No  abnormality. Adrenals/Urinary Tract: Adrenal glands are unremarkable. Kidneys are normal, without renal calculi, focal lesion, or hydronephrosis. Bladder is unremarkable. Stomach/Bowel: There is fluid filling of the nondilated stomach. Normal wall thickness. The unopacified small bowel is normal caliber without mesenteric inflammatory change. The appendix is normal. There is scattered colonic diverticulosis without evidence of diverticulitis. Vascular/Lymphatic: Aortic atherosclerosis. No enlarged abdominal or pelvic lymph nodes. Reproductive: There is mild prostatomegaly, stable. Other: No abdominal wall hernia or abnormality. No abdominopelvic ascites. There is no free hemorrhage or free air. Musculoskeletal: Facet hypertrophy and spondylosis lumbar spine. No acute or other significant osseous findings. Mild hip DJD. Moderate acquired spinal stenosis L4-5. IMPRESSION: 1. Questionable haziness at the pancreatic tail versus artifact due to beam hardening from the patient's arms in the field. Correlate with serum lipase for possible significance. 2. Bronchitis without evidence of pneumonia. 3. Aortic atherosclerosis. 4. 4.0 cm aortic root and 4.1 cm mid ascending segment. Recommend annual imaging followup by CTA or MRA. This recommendation follows 2010 ACCF/AHA/AATS/ACR/ASA/ SCA/SCAI/SIR/STS/SVM Guidelines for the Diagnosis and Management of Patients with Thoracic Aortic Disease. Circulation. 2010; 121: U132-G401. Aortic aneurysm NOS (ICD10-I71.9). 5. Diverticulosis without evidence of diverticulitis. 6. Mild prostatomegaly. 7. Moderate acquired spinal stenosis L4-5. Aortic Atherosclerosis (ICD10-I70.0). Electronically Signed   By: Almira Bar M.D.   On: 04/11/2023 01:47      Signature  -   Susa Raring M.D on 04/12/2023 at 10:43 AM   -  To page go to [email or web address]

## 2023-04-12 NOTE — Evaluation (Signed)
Occupational Therapy Evaluation and Discharge Patient Details Name: Gavin Dorsey MRN: [id number] DOB: March 14, 1961 Today's Date: 04/12/2023   History of Present Illness 62 y.o. male presents to [facility] hospital with recent dark stools, generalized weakness and focal LUE weakness. Pt admitted with concern for GIB. MRI evidence of acute intra-axial hemorrhage at the right cerebral peduncle. Of note pt recently admitted 12/18 with brainstem and left cerebellar infarcts, underwent stent placement for basilar artery. PMHx: DM II, HTN, CVA.   Clinical Impression   This 62 yo male admitted with above presents to acute OT at an overall S level when up on his feet and advised to use RW. He also displays visual difficulty with tracking and saccades so I am recommending outpatient OT follow up for this. No further acute OT needs, we will sign off.       If plan is discharge home, recommend the following: Assistance with cooking/housework;Assist for transportation;A little help with bathing/dressing/bathroom;A little help with walking and/or transfers    Functional Status Assessment  Patient has had a recent decline in their functional status and demonstrates the ability to make significant improvements in function in a reasonable and predictable amount of time.  Equipment Recommendations  None recommended by OT       Precautions / Restrictions Precautions Precautions: Fall Restrictions Weight Bearing Restrictions Per Provider Order: No      Mobility Bed Mobility Overal bed mobility: Modified Independent Bed Mobility: Supine to Sit, Sit to Supine     Supine to sit: Modified independent (Device/Increase time), HOB elevated (and increased time and effort) Sit to supine: Modified independent (Device/Increase time)        Transfers Overall transfer level: Needs assistance Equipment used: None Transfers: Sit to/from Stand Sit to Stand: Supervision           General transfer comment:  ambulating in room S      Balance Overall balance assessment: Needs assistance Sitting-balance support: No upper extremity supported, Feet supported Sitting balance-Leahy Scale: Good     Standing balance support: No upper extremity supported Standing balance-Leahy Scale: Fair                             ADL either performed or assessed with clinical judgement   ADL Overall ADL's : Needs assistance/impaired                                       General ADL Comments: increased time for safety when up on his feet; overall S level     Vision Baseline Vision/History: 0 No visual deficits Ability to See in Adequate Light: 0 Adequate Patient Visual Report: No change from baseline Vision Assessment?: Yes Eye Alignment: Within Functional Limits Ocular Range of Motion: Within Functional Limits Alignment/Gaze Preference: Within Defined Limits Tracking/Visual Pursuits: Able to track stimulus in all quads without difficulty Saccades: Additional eye shifts occurred during testing;Overshoots;Undershoots Convergence: Within functional limits Visual Fields: No apparent deficits Additional Comments: Pt does intermittenlty close left eye when talking to me while supine in bed--did not see while he was sitting EOB; could not do saccadic test, as well as needed VCs to keep head still while testing tracking.            Pertinent Vitals/Pain Pain Assessment Pain Assessment: No/denies pain     Extremity/Trunk Assessment Upper Extremity Assessment Upper Extremity Assessment:  Overall WFL for tasks assessed           Communication Communication Communication: No apparent difficulties   Cognition Arousal: Alert   Overall Cognitive Status: Within Functional Limits for tasks assessed                                                  Home Living Family/patient expects to be discharged to:: Private residence Living Arrangements:  Alone Available Help at Discharge: Friend(s);Available PRN/intermittently Type of Home: Apartment Home Access: Level entry     Home Layout: Two level;Bed/bath upstairs Alternate Level Stairs-Number of Steps: flight Alternate Level Stairs-Rails: Right;Left Bathroom Shower/Tub: Chief Strategy Officer: Standard     Home Equipment: Agricultural consultant (2 wheels)      Lives With: Alone    Prior Functioning/Environment Prior Level of Function : Independent/Modified Independent;Driving             Mobility Comments: independent prior to recent admission with CVA, has been staying at a friends house since recent d/c with assist for IADLs ADLs Comments: indep, does not work        OT Problem List: Impaired balance (sitting and/or standing);Impaired vision/perception         OT Goals(Current goals can be found in the care plan section) Acute Rehab OT Goals Patient Stated Goal: to go to friends house and then back home         AM-PAC OT "6 Clicks" Daily Activity     Outcome Measure Help from another person eating meals?: None Help from another person taking care of personal grooming?: A Little Help from another person toileting, which includes using toliet, bedpan, or urinal?: A Little Help from another person bathing (including washing, rinsing, drying)?: A Little Help from another person to put on and taking off regular upper body clothing?: A Little Help from another person to put on and taking off regular lower body clothing?: A Little 6 Click Score: 19   End of Session    Activity Tolerance: Patient tolerated treatment well Patient left: in bed;with call bell/phone within reach;with bed alarm set  OT Visit Diagnosis: Other abnormalities of gait and mobility (R26.89);Muscle weakness (generalized) (M62.81);Low vision, both eyes (H54.2)                Time: 1249-1310 OT Time Calculation (min): 21 min Charges:  OT General Charges $OT Visit: 1 Visit OT  Evaluation $OT Eval Moderate Complexity: 1 Mod  Cathy L. OT Acute Rehabilitation Services Office [phone]    Evette Georges 04/12/2023, 1:25 PM

## 2023-04-12 NOTE — Progress Notes (Addendum)
Pharmacy Antibiotic Note  Gavin Dorsey is a 62 y.o. male admitted on 04/10/2023 with sepsis.  Pharmacy has been consulted for vancomycin/cefepime dosing.  Patient is afebrile, WBC count is down from 17 to 10.3, and renal function improved since admission.   Plan: Increase vancomycin 1250 mg IV q24h >>>Estimated AUC: 457 (using IBW, Vd 0.5) Cefepime 2g IV q12h Trend WBC, temp, renal function  F/U infectious work-up Drug levels as indicated   Height: 5\' 8"  (172.7 cm) Weight: 102.1 kg (225 lb 1.4 oz) IBW/kg (Calculated) : 68.4  Temp (24hrs), Avg:98.2 F (36.8 C), Min:97.9 F (36.6 C), Max:98.9 F (37.2 C)  Recent Labs  Lab 04/10/23 2114 04/11/23 0238 04/11/23 0437 04/11/23 1517 04/11/23 1708 04/12/23 0311  WBC 17.0*  --   --   --  10.6* 10.3  CREATININE 1.56*  --   --  1.37*  --  1.25*  LATICACIDVEN  --  1.8 1.7  --   --   --     Estimated Creatinine Clearance: 71 mL/min (A) (by C-G formula based on SCr of 1.25 mg/dL (H)).    No Known Allergies  Current Antibiotics:  Vanco 12/26>  CFP 12/26 >   Microbiology Results:  12/26 BCX: NGTD x1 Cdiff: pending GI PCR: pending  Roslyn Smiling, PharmD PGY1 Pharmacy Resident 04/12/2023 9:37 AM  ==================================================== ADDENDUM 04/12/2023 11:02 AM  MD switched to azithromycin and ceftriaxone   Roslyn Smiling, PharmD PGY1 Pharmacy Resident 04/12/2023 11:03 AM

## 2023-04-12 NOTE — Progress Notes (Signed)
STROKE TEAM PROGRESS NOTE   BRIEF HPI [name] is a 62 y.o. male with history of HTN, HLD, DM2, recent acute and subacute brainstem and left cerebellar hemisphere infarctions 04/01/23 s/p basilar angioplasty and stenting with discharge on 12/22 on aspirin and Brilinta, statin presenting with dark stools, recent black bilious emesis, and left arm weakness. Initial evaluation reveals a + FOBT and neuroimaging revealed an acute intra-axial hemorrhage at the right cerebral peduncle with mild surrounding edema and no significant mass effect and was admitted for further evaluation.   SIGNIFICANT HOSPITAL EVENTS 12/27 - Admission for +FOBT and ICH - MRI brain 05:00 acute intra-axial hemorrhage at the right cerebral peduncle approximately 8 mm with mild surrounding edema and no significant mass effect - CT head stability scan 08:40 with stable ICH - Patient reports no vomiting in 12 hours, Hgb 15.5 on previous discharge, 12.1 on admission  - 12/28 on liquid diet, and heparin IV started on top of ASA 81  INTERIM HISTORY/SUBJECTIVE No family at bedside.  Neuro stable, intact.  No more active GI bleeding.  GI Dr. Tomasa Rand on board agree with liquid diet and heparin IV given high risk of basilar artery stent occlusion.  OBJECTIVE CBC    Component Value Date/Time   WBC 10.3 04/12/2023 0311   RBC 2.81 (L) 04/12/2023 0311   HGB 8.0 (L) 04/12/2023 1100   HGB 15.9 07/27/2011 1426   HCT 23.9 (L) 04/12/2023 1100   HCT 54.3 (H) 03/06/2023 1449   PLT 206 04/12/2023 0311   PLT 240 07/27/2011 1426   MCV 85.8 04/12/2023 0311   MCV 88 07/27/2011 1426   MCH 28.5 04/12/2023 0311   MCHC 33.2 04/12/2023 0311   RDW 16.6 (H) 04/12/2023 0311   RDW 13.5 07/27/2011 1426   LYMPHSABS 2.0 04/10/2023 2114   MONOABS 1.6 (H) 04/10/2023 2114   EOSABS 0.2 04/10/2023 2114   BASOSABS 0.1 04/10/2023 2114   BMET    Component Value Date/Time   NA 138 04/12/2023 0311   NA 139 07/27/2011 1426   K 3.8  04/12/2023 0311   K 3.6 07/27/2011 1426   CL 107 04/12/2023 0311   CL 104 07/27/2011 1426   CO2 26 04/12/2023 0311   CO2 27 07/27/2011 1426   GLUCOSE 95 04/12/2023 0311   GLUCOSE 123 (H) 07/27/2011 1426   BUN 27 (H) 04/12/2023 0311   BUN 17 07/27/2011 1426   CREATININE 1.25 (H) 04/12/2023 0311   CREATININE 1.27 07/27/2011 1426   CALCIUM 7.9 (L) 04/12/2023 0311   CALCIUM 8.3 (L) 07/27/2011 1426   GFRNONAA >60 04/12/2023 0311   GFRNONAA >60 07/27/2011 1426   Lab Results  Component Value Date   HGBA1C 5.2 04/01/2023   Lab Results  Component Value Date   CHOL 148 04/02/2023   HDL 27 (L) 04/02/2023   LDLCALC 101 (H) 04/02/2023   TRIG 96 04/06/2023   CHOLHDL 5.5 04/02/2023   IMAGING past 24 hours No results found.  Vitals:   04/11/23 2100 04/11/23 2124 04/12/23 0000 04/12/23 0400  BP:  111/71    Pulse: 80 78    Resp: (!) 21 18 18 18   Temp:  98.4 F (36.9 C)  98 F (36.7 C)  TempSrc:  Oral  Oral  SpO2: 100% 97%    Weight:  102.1 kg    Height:       PHYSICAL EXAM General:  Alert, well-nourished, well-developed patient in no acute distress laying in ER stretcher Psych:  Mood and  affect appropriate for situation, patient is calm and cooperative with exam CV: Regular rate and rhythm on bedside cardiac monitor Respiratory:  Regular, unlabored respirations on room air GI: Abdomen soft, rounded  NEURO:  Mental Status: AA&Ox3, patient is able to give clear and coherent history Speech/Language: speech is without dysarthria or aphasia.  Naming, repetition, fluency, and comprehension intact.  Cranial Nerves:  II: PERRL. Visual fields full.  III, IV, VI: EOMI. Eyelids elevate symmetrically.  V: Sensation is intact to light touch and symmetrical to face.  VII: Face is symmetrical resting and and with movement VIII: Hearing is intact to voice. IX, X: Palate elevates symmetrically. Phonation is normal.  XI: Shoulder shrug 5/5. XII: Tongue is midline without  fasciculations. Motor: 5/5 strength to all muscle groups tested.  Tone: is normal and bulk is normal Sensation: Intact to light touch bilaterally. Extinction absent to light touch to DSS.   Coordination: FTN intact bilaterally (initially there seemed to be some past pointing in the left upper extremity but with repeat examination, this improved), HKS: no ataxia in BLE.No drift.  Gait: Deferred  ASSESSMENT/PLAN  Intracerebral Hemorrhage:  acute intra-axial hemorrhage at the right cerebral peduncle, approximately 8 mm with mild surrounding edema Etiology:  Likely 2/2 DAPT from recent basilar stenting   CT Head (stability scan): Known 8 mm acute parenchymal hemorrhage at the right thalamic capsular junction/right cerebral peduncle.  Mild surrounding edema, unchanged.  Small chronic left occipital lobe cortical infarct. MRI : Acute small ICH at the right cerebral peduncle, approximately 8 mm.  Mild surrounding edema.  No significant mass effect.  No intraventricular or extra-axial extension identified.  Otherwise expected evolution of brainstem and left cerebellar infarcts since 04/01/2023.  No other acute intracranial abnormality.  Improved basilar artery flow void since 04/01/2023, evidence of continued occlusion distal left vertebral artery. MRA : Unchanged distal left vertebral artery occlusion.  Distal right vertebral artery remains patent.  Patent proximal basilar artery stent and improved distal basilar, bilateral SCA and PCA flow related enhancement since 04/01/2023.  Stable anterior circulation, mild to moderate bilateral supraclinoid ICA atherosclerotic stenosis.  2D Echo 04/02/23: LVEF 55 to 60%. LDL 101 HgbA1c 5.2 VTE prophylaxis - SCDs due to ICH and GIB aspirin 81 mg daily and Brilinta (ticagrelor) 90 mg bid prior to admission, now on aspirin 81 mg daily.  Discussed with Dr. Corliss Skains, will add heparin IV per stroke protocol given high risk of stent occlusion. 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 Corliss Skains  Near occlusion of the  right vertebral basilar junction just distal to the right posterior inferior cerebellar  artery with a string sign opacification of the proximal one third to half  of the basilar artery.  Distal one third of the basilar artery opacifies  retrogradely via a severely diseased left posterior communicating artery.   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 loaded with brilinta 12/19,  discharged home on Brilinta and ASA DAPT Stent assisted angioplasty of basilar artery performed 12/20 Now on aspirin 81 and heparin IV.  Plan to continue heparin IV for 4 to 5 days, if stable then switch to Brilinta 45 twice daily or Plavix 75 daily with checking P2 Y12 levels.  GIB Likely due to brilinta use recent hematemesis, melena FOBT +  HGB 15.5--12.1--8.6--8.0 Hold Brilinta On liquid diet PPI GI on board, no procedure needed at this time  Hypertensive during recent admission (12/17-12/22 requiring cleviprex and labetalol) Hypotensive blood pressure during current admission, query possible sepsis Home meds:  None Stable on the low end BP goal normotensive  Hyperlipidemia Home meds:  Crestor 40 mg daily LDL 101, goal < 70 Crestor resumed Continue statin at discharge  Diabetes type II  Controlled Non-insulin dependent  Home meds:  None HgbA1c 5.2, goal < 7.0 CBGs SSI as needed Close PCP follow up for DM control  Other Stroke Risk Factors ETOH use, alcohol level <10, advised to drink no more than 2 drink(s) a day Obesity, Body mass index is 34.22 kg/m., BMI >/= 30 associated with increased stroke risk, recommend weight loss, diet and exercise as appropriate   Other Active Problems leukocytosis WBC 17.0->10.6->10.3 Questionable UA with large leukocytes, small bacteria. Pending culture.  CXR with findings concerning for bronchitis  Blood cultures NGTD Respiratory panel unremarkable  Afebrile  CKD stage IIIa Cr 1.61--1.56--1.37--1.25  Hospital day # 1  I discussed with Drs. Marguarite Arbour and Destrehan. I spent extensive face-to-face time with the patient, more than 50% of which was spent in counseling and coordination of care, reviewing test results, images and medication, and discussing the diagnosis, treatment plan and potential prognosis. This patient's care requiresreview of multiple databases, neurological assessment, discussion with family, other specialists and medical decision making of high complexity.  Marvel Plan, MD PhD Stroke Neurology 04/12/2023 12:14 PM     To contact Stroke Continuity provider, please refer to [email or web address]. After hours, contact General Neurology

## 2023-04-12 NOTE — Plan of Care (Signed)
Pt A&O*4. No pain reported. Pt repositions himself in bed. Heparin drtt is running at 9. No signs and symptoms of bleeding during my shift. Sepsis screening negative and will continue to monitor.   Problem: Education: Goal: Knowledge of General Education information will improve Description: Including pain rating scale, medication(s)/side effects and non-pharmacologic comfort measures Outcome: Progressing   Problem: Health Behavior/Discharge Planning: Goal: Ability to manage health-related needs will improve Outcome: Progressing   Problem: Clinical Measurements: Goal: Ability to maintain clinical measurements within normal limits will improve Outcome: Progressing Goal: Will remain free from infection Outcome: Progressing Goal: Diagnostic test results will improve Outcome: Progressing Goal: Respiratory complications will improve Outcome: Progressing Goal: Cardiovascular complication will be avoided Outcome: Progressing   Problem: Activity: Goal: Risk for activity intolerance will decrease Outcome: Progressing   Problem: Nutrition: Goal: Adequate nutrition will be maintained Outcome: Progressing   Problem: Coping: Goal: Level of anxiety will decrease Outcome: Progressing   Problem: Elimination: Goal: Will not experience complications related to bowel motility Outcome: Progressing Goal: Will not experience complications related to urinary retention Outcome: Progressing   Problem: Pain Management: Goal: General experience of comfort will improve Outcome: Progressing   Problem: Safety: Goal: Ability to remain free from injury will improve Outcome: Progressing   Problem: Skin Integrity: Goal: Risk for impaired skin integrity will decrease Outcome: Progressing

## 2023-04-12 NOTE — Progress Notes (Signed)
[facility] ROUNDING NOTE   Subjective: Patient feeling better today.  No further emesis.  No bowel movement since yesterday morning. Hgb stable and BUN decreased significantly, from 71 to 27. WBC improved from 17 to 10  Objective: Vital signs in last 24 hours: Temp:  [97.9 F (36.6 C)-98.4 F (36.9 C)] 98 F (36.7 C) (12/28 0400) Pulse Rate:  [75-81] 78 (12/27 2124) Resp:  [11-26] 18 (12/28 0400) BP: (101-138)/(65-96) 111/71 (12/27 2124) SpO2:  [97 %-100 %] 97 % (12/27 2124) Weight:  [102.1 kg] 102.1 kg (12/27 2124)   General: NAD, pleasant Caucasian male Lungs:  CTA b/l, no w/r/r Heart:  RRR, no m/r/g Abdomen:  Soft, NT, ND, +BS   Intake/Output from previous day: 12/27 0701 - 12/28 0700 In: 589.4 [I.V.:489.4; IV Piggyback:100] Out: 1150 [Urine:1150] Intake/Output this shift: No intake/output data recorded.   Lab Results: Recent Labs    04/10/23 2114 04/11/23 1708 04/12/23 0311 04/12/23 1100  WBC 17.0* 10.6* 10.3  --   HGB 12.1* 8.3* 8.0* 8.0*  PLT 337 222 206  --   MCV 85.6 87.3 85.8  --    BMET Recent Labs    04/10/23 2114 04/11/23 1517 04/12/23 0311  NA 137 136 138  K 4.4 4.2 3.8  CL 106 104 107  CO2 21* 23 26  GLUCOSE 158* 112* 95  BUN 71* 60* 27*  CREATININE 1.56* 1.37* 1.25*  CALCIUM 8.8* 8.5* 7.9*   LFT Recent Labs    04/10/23 2114 04/11/23 1517 04/12/23 0311  PROT 6.4* 6.2* 5.0*  ALBUMIN 3.0* 2.9* 2.3*  AST 21 21 18   ALT 21 19 17   ALKPHOS 55 57 45  BILITOT 0.6 0.7 0.7   PT/INR No results for input(s): "INR" in the last 72 hours.    Imaging/Other results: CT HEAD WO CONTRAST ( ) Result Date: 04/11/2023 CLINICAL DATA:  Provided history: Weakness. EXAM: CT HEAD WITHOUT CONTRAST TECHNIQUE: Contiguous axial images were obtained from the base of the skull through the vertex without intravenous contrast. RADIATION DOSE REDUCTION: This exam was performed according to the departmental dose-optimization program which  includes automated exposure control, adjustment of the mA and/or kV according to patient size and/or use of iterative reconstruction technique. COMPARISON:  Brain MRI 04/11/2023. Non-contrast head CT and CT angiogram head/neck 04/01/2023. FINDINGS: Brain: Mild generalized cerebral atrophy. Known 8 mm acute parenchymal hemorrhage at the right thalamocapsular junction/right cerebral peduncle. Mild surrounding edema, unchanged. Known acute/subacute infarcts within the brainstem, left middle cerebellar peduncle and left cerebellar hemisphere, occult by CT and better appreciated on the brain MRI performed earlier today. Known small chronic cortical infarct within the left occipital lobe. Patchy and ill-defined hypoattenuation within the cerebral white matter, nonspecific but compatible with mild chronic small vessel ischemic disease. No extra-axial fluid collection. No evidence of an intracranial mass. No midline shift. Vascular: No hyperdense vessel. Vascular stent within the basilar artery. Atherosclerotic calcifications. Skull: No calvarial fracture or aggressive osseous lesion. Sinuses/Orbits: No mass or acute finding within the imaged orbits. Minimal mucosal thickening scattered within the paranasal sinuses. IMPRESSION: 1. Known 8 mm acute parenchymal hemorrhage at the right thalamocapsular junction/right cerebral peduncle. Mild surrounding edema, unchanged. 2. Known acute/subacute infarcts within the brainstem, left middle cerebellar peduncle and left cerebellar hemisphere, occult by CT and better appreciated on the brain MRI performed earlier today. 3. 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 Electronically Signed   By: Jackey Loge D.O.   On:  04/11/2023 09:01   MR BRAIN WO CONTRAST Addendum Date: 04/11/2023 ADDENDUM REPORT: 04/11/2023 06:47 ADDENDUM: MRI and MRA findings were discussed by telephone with Dr. Tereasa Coop on  04/11/2023 2841 hours. Electronically Signed   By: Odessa Fleming M.D.   On: 04/11/2023 06:47   Result Date: 04/11/2023 CLINICAL DATA:  62 year old male with scattered brainstem and left cerebellar hemisphere infarcts earlier this month, left vertebral artery occlusion, severe basilar artery stenosis/pre-occlusion status post stent assisted Basilar angioplasty on 04/04/2023. Neurologic deficit. EXAM: MRI HEAD WITHOUT CONTRAST TECHNIQUE: Multiplanar, multiecho pulse sequences of the brain and surrounding structures were obtained without intravenous contrast. COMPARISON:  Brain MRI 04/01/2023. Intracranial MRA today reported separately. FINDINGS: Brain: Expected evolution of scattered, patchy brainstem and left cerebellar hemisphere infarcts since 04/01/2023. New DWI susceptibility at the right cerebral peduncle with surrounding patchy T2 and FLAIR hyperintensity (series 7, image 17, series 8, image 45) compatible with intra-axial hemorrhage estimated at about 8 mm diameter. No significant mass effect. No other new diffusion abnormality when compared to 04/01/2023. No other intracranial hemorrhage identified. No IVH or ventriculomegaly. Stable other supratentorial gray and white matter signal, widely scattered nonspecific white matter T2 and FLAIR hyperintensity. An small area of left superior occipital pole encephalomalacia. No midline shift or intracranial mass effect. Basilar cisterns remain normal. Negative pituitary and cervicomedullary junction. Vascular: Continued absence of the distal left vertebral artery flow void. But improved basilar artery flow void since 04/01/2023. Other Major intracranial vascular flow voids are preserved. Skull and upper cervical spine: Stable, negative. Sinuses/Orbits: Stable, negative. Other: Mastoids remain well aerated. Grossly normal visible internal auditory structures. Stable visible scalp and face. IMPRESSION: 1. MRI evidence of acute intra-axial hemorrhage at the right cerebral  peduncle, proximally 8 mm. Mild surrounding edema. No significant mass effect. No intraventricular or extra-axial extension identified. 2. Otherwise expected evolution of brainstem and left cerebellar infarcts since 04/01/2023. No other acute intracranial abnormality. 3. Improved basilar artery flow void since 04/01/2023, evidence of continued occlusion distal left vertebral artery. See MRA today reported separately. Electronically Signed: By: Odessa Fleming M.D. On: 04/11/2023 06:21   MR ANGIO HEAD WO CONTRAST Result Date: 04/11/2023 CLINICAL DATA:  62 year old male with scattered brainstem and left cerebellar hemisphere infarcts earlier this month, left vertebral artery occlusion, severe basilar artery stenosis/pre-occlusion status post stent assisted Basilar angioplasty on 04/04/2023. Neurologic deficit. EXAM: MRA HEAD WITHOUT CONTRAST TECHNIQUE: Angiographic images of the Circle of Willis were acquired using MRA technique without intravenous contrast. COMPARISON:  Brain MRI today. Pretreatment CTA head and neck 04/01/2023. FINDINGS: Anterior circulation: Tortuous distal cervical ICAs and antegrade flow signal throughout both ICA siphons. Ophthalmic artery origins appear normal. Mild to moderate narrowing of the supraclinoid ICAs appears stable. Patent carotid termini, MCA and ACA origins. Visible ACA and MCA branches appears stable. Posterior circulation: No distal left vertebral artery antegrade flow signal. Antegrade flow in the distal right vertebral artery appears stable to the vertebrobasilar junction. Right PICA origin is patent. New proximal basilar artery susceptibility artifact compatible with new vascular stent (series 3, image 37) with maintained antegrade flow in the distal basilar artery indicating stent patency. SCA and PCA origins are patent. Posterior communicating arteries are diminutive or absent. Bilateral PCA branches appear improved since 04/01/2023. Anatomic variants: None significant. Other:  Subtle new susceptibility artifact at the right midbrain, cerebral peduncle. See MRI today reported separately. IMPRESSION: 1. Unchanged distal left vertebral artery occlusion. Distal right vertebral artery remains patent. Patent proximal Basilar artery stent and improved distal Basilar,  bilateral SCA and PCA flow related enhancement since 03/31/2061. 2. Stable anterior circulation, mild to moderate bilateral supraclinoid ICA atherosclerotic stenosis. 3. Right cerebral peduncle hemorrhage on MRI today is reported separately. Electronically Signed   By: Odessa Fleming M.D.   On: 04/11/2023 06:26   CT CHEST ABDOMEN PELVIS W CONTRAST Result Date: 04/11/2023 CLINICAL DATA:  Dark stools, hematemesis and generalized weakness left upper extremity. Being evaluated for sepsis. Checking for source. 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 EXAM: CT CHEST, ABDOMEN, AND PELVIS WITH CONTRAST TECHNIQUE: Multidetector CT imaging of the chest, abdomen and pelvis was performed following the standard protocol during bolus administration of intravenous contrast. RADIATION DOSE REDUCTION: This exam was performed according to the departmental dose-optimization program which includes automated exposure control, adjustment of the mA and/or kV according to patient size and/or use of iterative reconstruction technique. CONTRAST:  75mL OMNIPAQUE IOHEXOL 350 MG/ML SOLN COMPARISON:  No recent chest x-ray. Comparison is made with the abdomen and pelvis CT with contrast 11/12/2021 and a chest CT with contrast 09/11/2006. FINDINGS: CT CHEST FINDINGS Cardiovascular: The cardiac size is normal. There is no pericardial effusion. The pulmonary arteries and veins are normal in caliber. The pulmonary arteries are centrally clear. Small amount of scattered aortic and great vessel atherosclerosis. There is no stenosis or dissection. The aortic root measures 4.0 cm at the sinuses of Valsalva on  6:107. The mid ascending segment 4.1 cm on 6:111. Annual follow-up imaging recommended. Mediastinum/Nodes: No enlarged mediastinal, hilar, or axillary lymph nodes. The lower poles of the thyroid gland, trachea, and esophagus demonstrate no significant findings. Lungs/Pleura: There is diffuse bronchial thickening. There is no consolidation, effusion, pneumothorax or visible pulmonary nodule. 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 No acute or other significant osseous findings. Unremarkable chest wall. CT ABDOMEN PELVIS FINDINGS Hepatobiliary: No focal liver abnormality is seen. No calcified gallstones, gallbladder wall thickening, or biliary dilatation. Pancreas: Questionable haziness at the pancreatic tail versus artifact due to beam hardening from the patient's arms in the field. Correlate with serum lipase for possible significance. There is no pancreatic mass enhancement or ductal dilatation, no other focal abnormality. Spleen: No abnormality. Adrenals/Urinary Tract: Adrenal glands are unremarkable. Kidneys are normal, without renal calculi, focal lesion, or hydronephrosis. Bladder is unremarkable. Stomach/Bowel: There is fluid filling of the nondilated stomach. Normal wall thickness. The unopacified small bowel is normal caliber without mesenteric inflammatory change. The appendix is normal. There is scattered colonic diverticulosis without evidence of diverticulitis. Vascular/Lymphatic: Aortic atherosclerosis. No enlarged abdominal or pelvic lymph nodes. Reproductive: There is mild prostatomegaly, stable. Other: No abdominal wall hernia or abnormality. No abdominopelvic ascites. There is no free hemorrhage or free air. Musculoskeletal: Facet hypertrophy and spondylosis lumbar spine. No acute or other significant osseous findings. Mild hip DJD. Moderate acquired spinal stenosis L4-5. IMPRESSION: 1. Questionable haziness at the pancreatic tail versus  artifact due to beam hardening from the patient's arms in the field. Correlate with serum lipase for possible significance. 2. Bronchitis without evidence of pneumonia. 3. Aortic atherosclerosis. 4. 4.0 cm aortic root and 4.1 cm mid ascending segment. Recommend annual imaging followup by CTA or MRA. This recommendation follows 2010 ACCF/AHA/AATS/ACR/ASA/ SCA/SCAI/SIR/STS/SVM Guidelines for the Diagnosis and Management of Patients with Thoracic Aortic Disease. Circulation. 2010; 121: Z610-R604. Aortic aneurysm NOS (ICD10-I71.9). 5. Diverticulosis without evidence of diverticulitis. 6. Mild prostatomegaly. 7. Moderate acquired spinal stenosis L4-5. Aortic Atherosclerosis (ICD10-I70.0). Electronically Signed  By: Almira Bar M.D.   On: 04/11/2023 01:47      Assessment and Plan:  62 year old male with recent CVA (Dec 20) treated with basilar artery stent, readmitted with 2 days of melena/coffee ground emesis and 4 point drop in hemoglobin with elevated BUN, consistent with upper GI bleed. He was hypotensive on arrival but responded to IV fluids and has been hemodynamically stable since then. His hemoglobin remained stable overnight, and his BUN improved significantly.  This likely indicates that his bleeding has slowed significantly or stopped for the moment. This is reassuring, but he remains high risk for rebleeding.  He is in a difficult spot due to his recent stroke putting him at high risk for sedation complications, recent stent placement which requires aggressive anticoagulation and significant upper GI bleed.  As his more severe complication would likely be related to stent thrombosis, I agree with erring on the side of restarting anticoagulation early and hoping that his GI bleeding resolves.  After multidisciplinary discussion between myself, the hospitalist and neurology and IR, we elected to proceed with starting heparin drip without bolus and closely monitoring for signs of recurrent GI  bleeding. If he rebleeds, I think we will likely need to perform an upper endoscopy to try to achieve hemostasis  Upper GI bleed in setting of DAPT - Continue twice daily PPI IV - Continue Carafate p.o. 4 times daily - Okay to advance to full liquids - Continue to monitor closely for active bleeding - Will likely require upper endoscopy if the patient shows signs of rebleeding once heparin started  Recent stroke with basilar artery stent and small acute intra-axial hemorrhage - Continuing aspirin - Brilinta being held since admission - Will start heparin drip without bolus today - Neurochecks per neurology  Sepsis physiology with bronchitis -Leukocytosis improved from 17-10 with empiric antibiotics - Antibiotics being narrowed to Rocephin and azithromycin per hospitalist, blood cultures negative, respiratory panel negative - Afebrile, normotensive     Jenel Lucks, MD  04/12/2023, 11:58 AM [facility]

## 2023-04-12 NOTE — Progress Notes (Signed)
PHARMACY - ANTICOAGULATION CONSULT NOTE  Pharmacy Consult for heparin Indication: s/p IR neuro revascularization   No Known Allergies  Patient Measurements: Height: 5\' 8"  (172.7 cm) Weight: 102.1 kg (225 lb 1.4 oz) IBW/kg (Calculated) : 68.4 Heparin Dosing Weight: 90 kg  Vital Signs: Temp: 98 F (36.7 C) (12/28 0400) Temp Source: Oral (12/28 0400) BP: 111/71 (12/27 2124) Pulse Rate: 78 (12/27 2124)  Labs: Recent Labs    04/10/23 2114 04/11/23 1517 04/11/23 1708 04/12/23 0311  HGB 12.1*  --  8.3* 8.0*  HCT 36.9*  --  25.4* 24.1*  PLT 337  --  222 206  CREATININE 1.56* 1.37*  --  1.25*    Estimated Creatinine Clearance: 71 mL/min (A) (by C-G formula based on SCr of 1.25 mg/dL (H)).   Medical History: Past Medical History:  Diagnosis Date   Diabetes mellitus without complication (HCC)    Hypertension     Medications:  Scheduled:   sodium chloride   Intravenous Once   aspirin EC  81 mg Oral Daily   pantoprazole (PROTONIX) IV  40 mg Intravenous Q12H   rosuvastatin  40 mg Oral Daily   sodium chloride flush  3 mL Intravenous Q12H   Infusions:   ceFEPime (MAXIPIME) IV 2 g (04/12/23 0028)   metronidazole 500 mg (04/11/23 2310)   vancomycin 1,000 mg (04/12/23 0124)   PRN: acetaminophen **OR** acetaminophen, ondansetron **OR** ondansetron (ZOFRAN) IV  Assessment: 62yo male with 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 Patient was on Brilinta and aspirin secondary to recent CVA and presented with upper GI bleed with hematemesis and melena. Brilinta held and ASA resumed on 12/27. Pharmacy consulted to start heparin with no bolus.  Patient's CBC okay - hgb 8 and plts 206.   Goal of Therapy:  Heparin level 0.1-0.25 units/ml Monitor platelets by anticoagulation protocol: Yes   Plan:  Start heparin infusion at 800 units/hr Check anti-Xa level in 6 hours and daily while on heparin Continue  to monitor H&H and platelets. Follow-up plans to start Plavix or Brilinta   Roslyn Smiling, PharmD PGY1 Pharmacy Resident 04/12/2023 9:20 AM

## 2023-04-12 NOTE — Progress Notes (Signed)
PHARMACY - ANTICOAGULATION CONSULT NOTE  Pharmacy Consult for heparin Indication: s/p IR neuro revascularization, high risk stent occlusion   No Known Allergies  Patient Measurements: Height: 5\' 8"  (172.7 cm) Weight: 102.1 kg (225 lb 1.4 oz) IBW/kg (Calculated) : 68.4 Heparin Dosing Weight: 90 kg  Vital Signs: Temp: 98 F (36.7 C) (12/28 1234) Temp Source: Oral (12/28 1234)  Labs: Recent Labs    04/10/23 2114 04/11/23 1517 04/11/23 1708 04/12/23 0311 04/12/23 1100 04/12/23 1641  HGB 12.1*  --  8.3* 8.0* 8.0*  --   HCT 36.9*  --  25.4* 24.1* 23.9*  --   PLT 337  --  222 206  --   --   HEPARINUNFRC  --   --   --   --   --  <0.10*  CREATININE 1.56* 1.37*  --  1.25*  --   --     Estimated Creatinine Clearance: 71 mL/min (A) (by C-G formula based on SCr of 1.25 mg/dL (H)).   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 Patient was on Brilinta and aspirin secondary to recent CVA and presented with upper GI bleed with hematemesis and melena. Brilinta held and ASA resumed on 12/27. Pharmacy consulted to start heparin with no bolus and low goal.  Heparin level <0.1 is subtherapeutic on 800 units/hr.  No issues with infusion or bleeding per RN.  Goal of Therapy:  Heparin level 0.1-0.25 units/ml Monitor platelets by anticoagulation protocol: Yes   Plan:  Increase heparin to 900 units/hr Monitor daily heparin level, CBC, signs/symptoms of bleeding  Follow-up plans to start Plavix or Brilinta   Alphia Moh, PharmD, BCPS, BCCP Clinical Pharmacist  Please check AMION for all [facility] Pharmacy phone numbers After 10:00 PM, call Main Pharmacy [phone]

## 2023-04-12 NOTE — Plan of Care (Signed)
Pt has rested quietly throughout the night with no distress noted. Alert and oriented. On room air. SR on the monitor. Voids per urinal. No complaints voiced. NPO.     Problem: Education: Goal: Knowledge of General Education information will improve Description: Including pain rating scale, medication(s)/side effects and non-pharmacologic comfort measures Outcome: Progressing   Problem: Clinical Measurements: Goal: Ability to maintain clinical measurements within normal limits will improve Outcome: Progressing   Problem: Pain Management: Goal: General experience of comfort will improve Outcome: Progressing

## 2023-04-12 NOTE — Evaluation (Addendum)
Clinical/Bedside Swallow Evaluation Patient Details  Name: Gavin Dorsey MRN: [id number] Date of Birth: 1960/10/21  Today's Date: 04/12/2023 Time: SLP Start Time (ACUTE ONLY): 7062 SLP Stop Time (ACUTE ONLY): 0927 SLP Time Calculation (min) (ACUTE ONLY): 9 min  Past Medical History:  Past Medical History:  Diagnosis Date   Diabetes mellitus without complication (HCC)    Hypertension    Past Surgical History:  Past Surgical History:  Procedure Laterality Date   IR ANGIO INTRA EXTRACRAN SEL COM CAROTID INNOMINATE BILAT MOD SED  04/02/2023   IR ANGIO VERTEBRAL SEL SUBCLAVIAN INNOMINATE UNI L MOD SED  04/02/2023   IR ANGIO VERTEBRAL SEL VERTEBRAL UNI R MOD SED  04/02/2023   IR ANGIO VERTEBRAL SEL VERTEBRAL UNI R MOD SED  04/04/2023   IR CT HEAD LTD  04/04/2023   IR INTRA CRAN STENT  04/04/2023   IR US GUIDE VASC ACCESS RIGHT  04/02/2023   IR US GUIDE VASC ACCESS RIGHT  04/04/2023   RADIOLOGY WITH ANESTHESIA N/A 04/04/2023   Procedure: basilar artery angioplasty;  Surgeon: Julieanne Cotton, MD;  Location: [facility] OR;  Service: Radiology;  Laterality: N/A;   HPI:  62 y.o. 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  Patient stated that he began taking Brilinta at time of the discharge 2 days ago.  During my evaluation at the bedside patient reported that he has 3-4 episodes of loose stool in last 24 hours and also noticed black-colored stool for 3 to episodes as well.  Denies any bright red blood per rectum.  Patient denies any abdominal cramping and pain.   Denies any nausea, vomiting, fever and chill.  Patient reported that since his last stroke he is still having some weakness of the left upper extremity but denies any generalized weakness, dysarthria and dysphagia.  Patient denies any chest pain and shortness of breath.     While in ED, pt underwent MRI brain which was notable for acute intra-axial hemorrhage at the R cerebral peduncle, 8mm without significant mass effect. Neurology was consulted. GI was also consulted for concerns of GI bleed.    Assessment / Plan / Recommendation  Clinical Impression   Pt presents with a functional oropharyngeal swallow per clinical swallow assessment completed today. Oral prep and transit prompt with complete oral clearance. Pharyngeal swallow initiation appeared timely with laryngeal elevation noted. No overt or subtle s/s of aspiration noted across consistencies.   Recommend a regular diet and thin liquids as tolerated from SLP standpoint. Per GI, limited to a full liquid diet at this time. No acute SLP needs identified at this time. SLP will sign off. Please re-consult if pt exhibits concerns for aspiration with PO intake. GI to advance diet further as tolerated.  Speech/Language screen: Conversational speech observed to be 100% fluent and intelligible with no dysarthria or anomia noted. Pt denied acute changes to speech, language, cognitive functions at this time. A formal speech/language assessment is not indicated.   SLP Visit Diagnosis:  (r/o oropharyngeal dysphagia)    Aspiration Risk  No limitations    Diet Recommendation Regular;Thin liquid    Liquid Administration via: Cup;Straw Medication Administration: Whole meds with  liquid Supervision: Patient able to self feed Compensations: Small sips/bites;Slow rate Postural Changes: Seated upright at 90 degrees    Other  Recommendations Oral Care Recommendations: Oral care BID    Recommendations for follow up therapy are one component of a  multi-disciplinary discharge planning process, led by the attending physician.  Recommendations may be updated based on patient status, additional functional criteria and insurance authorization.  Follow up Recommendations Follow physician's recommendations for discharge plan and follow up therapies      Assistance Recommended at Discharge  None per SLP   Functional Status Assessment Patient has not had a recent decline in their functional status    Swallow Study   General Date of Onset: 04/10/23 HPI: 62 y.o. 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  Patient stated that he began taking Brilinta at time of the discharge 2 days ago.  During my evaluation at the bedside patient reported that he has 3-4 episodes of loose stool in last 24 hours and also noticed black-colored stool for 3 to episodes as well.  Denies any bright red blood per rectum.  Patient denies any abdominal cramping and pain.  Denies any nausea, vomiting, fever and chill.  Patient reported that since his last stroke he is still having some weakness of the left upper extremity but denies any generalized weakness, dysarthria and dysphagia.  Patient denies any chest pain and shortness of breath.     While in ED, pt underwent MRI brain which was notable for acute intra-axial hemorrhage at the R cerebral peduncle, 8mm without significant mass effect. Neurology was consulted. GI was also consulted for concerns of GI bleed. Type of Study: Bedside Swallow Evaluation Previous Swallow Assessment:  Cognitive-linguistic assessment on 04/06/23: WNL; no prior swallow evaluation Diet Prior to this Study: NPO Temperature Spikes Noted: No Respiratory Status: Room air History of Recent Intubation: No Behavior/Cognition: Alert;Cooperative;Pleasant mood Oral Cavity Assessment: Within Functional Limits Oral Cavity - Dentition: Adequate natural dentition Vision: Functional for self-feeding Self-Feeding Abilities: Able to feed self Patient Positioning: Upright in bed Volitional Swallow: Able to elicit    Oral/Motor/Sensory Function Overall Oral Motor/Sensory Function: Within functional limits   Ice Chips Ice chips: Not tested   Thin Liquid Thin Liquid: Within functional limits Presentation: Cup;Self Fed    Nectar Thick Nectar Thick Liquid: Not tested   Honey Thick Honey Thick Liquid: Not tested   Puree Puree: Within functional limits Presentation: Self Fed   Solid     Solid: Within functional limits Presentation: Self Fed      Ellery Plunk 04/12/2023,9:44 AM

## 2023-04-12 NOTE — Evaluation (Signed)
Physical Therapy Evaluation Patient Details Name: Gavin Dorsey MRN: [id number] DOB: [birth year] Today's Date: 04/12/2023  History of Present Illness  62 y.o. male presents to [facility] hospital with recent dark stools, generalized weakness and focal LUE weakness. Pt admitted with concern for GIB. Of note pt recently admitted 12/18 with brainstem and cerebellar peduncle infarcts, underwent stent placement for basilar artery. PMHx: DM II, HTN, CVA.  Clinical Impression  Pt presents to PT with deficits in gait and balance. Pt with frequent lateral drift and multiple instances of staggering during session, however pt corrects with stepping strategy and does not need PT physical assistance during this session. Pt will benefit from further dynamic gait and balance training in an effort to reduce falls risk. PT encourages the pt to utilize a RW to improve stability at this time. PT recommends discharge home with outpatient PT when medically appropriate.      If plan is discharge home, recommend the following: A little help with walking and/or transfers;A little help with bathing/dressing/bathroom;Assistance with cooking/housework;Assist for transportation;Help with stairs or ramp for entrance   Can travel by private vehicle        Equipment Recommendations None recommended by PT (pt reports having access to a RW)  Recommendations for Other Services       Functional Status Assessment Patient has had a recent decline in their functional status and demonstrates the ability to make significant improvements in function in a reasonable and predictable amount of time.     Precautions / Restrictions Precautions Precautions: Fall Restrictions Weight Bearing Restrictions Per Provider Order: No      Mobility  Bed Mobility Overal bed mobility: Independent                  Transfers Overall transfer level: Needs assistance Equipment used: None Transfers: Sit to/from Stand Sit to Stand:  Supervision                Ambulation/Gait Ambulation/Gait assistance: Contact guard assist Gait Distance (Feet): 400 Feet Assistive device: None Gait Pattern/deviations: Step-through pattern, Staggering left, Staggering right Gait velocity: functional Gait velocity interpretation: 1.31 - 2.62 ft/sec, indicative of limited community ambulator   General Gait Details: pt with multiple instances of staggering to each side during eval, often with increased lateral drift. No physical assistance required for pt to correct losses of balance  Stairs            Wheelchair Mobility     Tilt Bed    Modified Rankin (Stroke Patients Only)       Balance Overall balance assessment: Needs assistance Sitting-balance support: No upper extremity supported, Feet supported Sitting balance-Leahy Scale: Good     Standing balance support: No upper extremity supported, During functional activity Standing balance-Leahy Scale: Fair                               Pertinent Vitals/Pain Pain Assessment Pain Assessment: No/denies pain    Home Living Family/patient expects to be discharged to:: Private residence Living Arrangements: Alone Available Help at Discharge: Friend(s);Available PRN/intermittently Type of Home: Apartment Home Access: Level entry     Alternate Level Stairs-Number of Steps: flight Home Layout: Two level;Bed/bath upstairs Home Equipment: Rolling Walker (2 wheels)      Prior Function Prior Level of Function : Independent/Modified Independent;Driving             Mobility Comments: independent prior to recent admission with CVA, has  been staying at a friends house since recent d/c with assist for IADLs       Extremity/Trunk Assessment   Upper Extremity Assessment Upper Extremity Assessment: Overall WFL for tasks assessed    Lower Extremity Assessment Lower Extremity Assessment: Overall WFL for tasks assessed    Cervical / Trunk  Assessment Cervical / Trunk Assessment: Normal  Communication   Communication Communication: No apparent difficulties Cueing Techniques: Verbal cues  Cognition Arousal: Alert Behavior During Therapy: WFL for tasks assessed/performed Overall Cognitive Status: Within Functional Limits for tasks assessed                                          General Comments General comments (skin integrity, edema, etc.): VSS on RA    Exercises     Assessment/Plan    PT Assessment Patient needs continued PT services  PT Problem List Decreased balance;Decreased mobility       PT Treatment Interventions Gait training;DME instruction;Stair training;Functional mobility training;Balance training;Neuromuscular re-education;Patient/family education    PT Goals (Current goals can be found in the Care Plan section)  Acute Rehab PT Goals Patient Stated Goal: to return to independence PT Goal Formulation: With patient Time For Goal Achievement: 04/26/23 Potential to Achieve Goals: Fair Additional Goals Additional Goal #1: Pt will score >19/24 on the DGI to indicate a reduced risk for falls Additional Goal #2: Pt will score >45/56 on the BERG to indicate a reduced risk for falls    Frequency Min 1X/week     Co-evaluation               AM-PAC PT "6 Clicks" Mobility  Outcome Measure Help needed turning from your back to your side while in a flat bed without using bedrails?: None Help needed moving from lying on your back to sitting on the side of a flat bed without using bedrails?: None Help needed moving to and from a bed to a chair (including a wheelchair)?: A Little Help needed standing up from a chair using your arms (e.g., wheelchair or bedside chair)?: A Little Help needed to walk in hospital room?: A Little Help needed climbing 3-5 steps with a railing? : A Little 6 Click Score: 20    End of Session Equipment Utilized During Treatment: Gait belt Activity  Tolerance: Patient tolerated treatment well Patient left: in bed;with call bell/phone within reach;with bed alarm set Nurse Communication: Mobility status PT Visit Diagnosis: Other abnormalities of gait and mobility (R26.89);Other symptoms and signs involving the nervous system (R29.898)    Time: 1096-0454 PT Time Calculation (min) (ACUTE ONLY): 10 min   Charges:   PT Evaluation $PT Eval Low Complexity: 1 Low   PT General Charges $$ ACUTE PT VISIT: 1 Visit         Arlyss Gandy, PT, DPT Acute Rehabilitation Office [phone]   Arlyss Gandy 04/12/2023, 11:36 AM

## 2023-04-13 DIAGNOSIS — K920 Hematemesis: Secondary | ICD-10-CM

## 2023-04-13 LAB — CBC WITH DIFFERENTIAL/PLATELET
Abs Immature Granulocytes: 0.05 10*3/uL (ref 0.00–0.07)
Basophils Absolute: 0.1 10*3/uL (ref 0.0–0.1)
Basophils Relative: 1 %
Eosinophils Absolute: 0.5 10*3/uL (ref 0.0–0.5)
Eosinophils Relative: 5 %
HCT: 21.9 % — ABNORMAL LOW (ref 39.0–52.0)
Hemoglobin: 7.4 g/dL — ABNORMAL LOW (ref 13.0–17.0)
Immature Granulocytes: 1 %
Lymphocytes Relative: 26 %
Lymphs Abs: 2.3 10*3/uL (ref 0.7–4.0)
MCH: 28.9 pg (ref 26.0–34.0)
MCHC: 33.8 g/dL (ref 30.0–36.0)
MCV: 85.5 fL (ref 80.0–100.0)
Monocytes Absolute: 0.6 10*3/uL (ref 0.1–1.0)
Monocytes Relative: 7 %
Neutro Abs: 5.2 10*3/uL (ref 1.7–7.7)
Neutrophils Relative %: 60 %
Platelets: 214 10*3/uL (ref 150–400)
RBC: 2.56 MIL/uL — ABNORMAL LOW (ref 4.22–5.81)
RDW: 16.3 % — ABNORMAL HIGH (ref 11.5–15.5)
WBC: 8.6 10*3/uL (ref 4.0–10.5)
nRBC: 0 % (ref 0.0–0.2)

## 2023-04-13 LAB — PHOSPHORUS: Phosphorus: 2.9 mg/dL (ref 2.5–4.6)

## 2023-04-13 LAB — BASIC METABOLIC PANEL
Anion gap: 6 (ref 5–15)
BUN: 14 mg/dL (ref 8–23)
CO2: 26 mmol/L (ref 22–32)
Calcium: 8 mg/dL — ABNORMAL LOW (ref 8.9–10.3)
Chloride: 107 mmol/L (ref 98–111)
Creatinine, Ser: 1.22 mg/dL (ref 0.61–1.24)
GFR, Estimated: >60 mL/min (ref >60)
Glucose, Bld: 84 mg/dL (ref 70–99)
Potassium: 3.5 mmol/L (ref 3.5–5.1)
Sodium: 139 mmol/L (ref 135–145)

## 2023-04-13 LAB — GLUCOSE, CAPILLARY
Glucose-Capillary: 163 mg/dL — ABNORMAL HIGH (ref 70–99)
Glucose-Capillary: 112 mg/dL — ABNORMAL HIGH (ref 70–99)
Glucose-Capillary: 112 mg/dL — ABNORMAL HIGH (ref 70–99)

## 2023-04-13 LAB — HEMOGLOBIN AND HEMATOCRIT, BLOOD
HCT: 23.3 % — ABNORMAL LOW (ref 39.0–52.0)
Hemoglobin: 7.7 g/dL — ABNORMAL LOW (ref 13.0–17.0)

## 2023-04-13 LAB — HEPARIN LEVEL (UNFRACTIONATED): Heparin Unfractionated: 0.11 [IU]/mL — ABNORMAL LOW (ref 0.30–0.70)

## 2023-04-13 LAB — MAGNESIUM: Magnesium: 1.8 mg/dL (ref 1.7–2.4)

## 2023-04-13 NOTE — Plan of Care (Signed)
Pt has rested quietly throughout the night with no distress noted. Alert and oriented. On O22LNC (placed during night when pt dropped sats to mid 80's while sleeping). SR on the monitor. Voids per urinal. Heparin drip no change. No complaints voiced.     Problem: Health Behavior/Discharge Planning: Goal: Ability to manage health-related needs will improve Outcome: Progressing   Problem: Clinical Measurements: Goal: Diagnostic test results will improve Outcome: Progressing   Problem: Pain Management: Goal: General experience of comfort will improve Outcome: Progressing

## 2023-04-13 NOTE — Progress Notes (Signed)
[facility] ROUNDING NOTE   Subjective: No acute events overnight.  Patient feeling well today.  Hemoglobin stable at 8.  BUN has continued to downtrend nicely, 14 this morning.  No bowel movement yesterday or overnight.  Tolerating full liquid diet without any nausea/dyspepsia   Objective: Vital signs in last 24 hours: Temp:  [98 F (36.7 C)-98.7 F (37.1 C)] 98.7 F (37.1 C) (12/29 0400) Pulse Rate:  [82] 82 (12/28 2037) Resp:  [18] 18 (12/29 0000) BP: (106)/(58) 106/58 (12/28 2037)   General: NAD, pleasant Caucasian male lying in bed Lungs:  CTA b/l, no w/r/r Heart:  RRR, no m/r/g Abdomen:  Soft, NT, ND, +BS Ext:  No c/c/e    Intake/Output from previous day: 12/28 0701 - 12/29 0700 In: -  Out: 1100 [Urine:1100] Intake/Output this shift: No intake/output data recorded.   Lab Results: Recent Labs    04/11/23 1708 04/12/23 0311 04/12/23 1100 04/12/23 1918 04/13/23 0251  WBC 10.6* 10.3  --   --  8.6  HGB 8.3* 8.0* 8.0* 7.6* 7.4*  PLT 222 206  --   --  214  MCV 87.3 85.8  --   --  85.5   BMET Recent Labs    04/11/23 1517 04/12/23 0311 04/13/23 0251  NA 136 138 139  K 4.2 3.8 3.5  CL 104 107 107  CO2 23 26 26   GLUCOSE 112* 95 84  BUN 60* 27* 14  CREATININE 1.37* 1.25* 1.22  CALCIUM 8.5* 7.9* 8.0*   LFT Recent Labs    04/10/23 2114 04/11/23 1517 04/12/23 0311  PROT 6.4* 6.2* 5.0*  ALBUMIN 3.0* 2.9* 2.3*  AST 21 21 18   ALT 21 19 17   ALKPHOS 55 57 45  BILITOT 0.6 0.7 0.7   PT/INR No results for input(s): "INR" in the last 72 hours.    Imaging/Other results: No results found.    Assessment and Plan:   Expand All Collapse All  [facility] ROUNDING NOTE     Subjective: Patient feeling better today.  No further emesis.  No bowel movement since yesterday morning. Hgb stable and BUN decreased significantly, from 71 to 27. WBC improved from 17 to 10   Objective: Vital signs in last 24 hours: Temp:  [97.9  F (36.6 C)-98.4 F (36.9 C)] 98 F (36.7 C) (12/28 0400) Pulse Rate:  [75-81] 78 (12/27 2124) Resp:  [11-26] 18 (12/28 0400) BP: (101-138)/(65-96) 111/71 (12/27 2124) SpO2:  [97 %-100 %] 97 % (12/27 2124) Weight:  [102.1 kg] 102.1 kg (12/27 2124) General: NAD, pleasant Caucasian male Lungs:  CTA b/l, no w/r/r Heart:  RRR, no m/r/g Abdomen:  Soft, NT, ND, +BS     Intake/Output from previous day: 12/27 0701 - 12/28 0700 In: 589.4 [I.V.:489.4; IV Piggyback:100] Out: 1150 [Urine:1150] Intake/Output this shift: No intake/output data recorded.     Lab Results: Recent Labs (last 2 labs)        Recent Labs    04/10/23 2114 04/11/23 1708 04/12/23 0311 04/12/23 1100  WBC 17.0* 10.6* 10.3  --   HGB 12.1* 8.3* 8.0* 8.0*  PLT 337 222 206  --   MCV 85.6 87.3 85.8  --       BMET Recent Labs (last 2 labs)       Recent Labs    04/10/23 2114 04/11/23 1517 04/12/23 0311  NA 137 136 138  K 4.4 4.2 3.8  CL 106 104 107  CO2 21* 23 26  GLUCOSE 158* 112* 95  BUN 71* 60* 27*  CREATININE 1.56* 1.37* 1.25*  CALCIUM 8.8* 8.5* 7.9*      LFT Recent Labs (last 2 labs)       Recent Labs    04/10/23 2114 04/11/23 1517 04/12/23 0311  PROT 6.4* 6.2* 5.0*  ALBUMIN 3.0* 2.9* 2.3*  AST 21 21 18   ALT 21 19 17   ALKPHOS 55 57 45  BILITOT 0.6 0.7 0.7      PT/INR Recent Labs (last 2 labs)  No results for input(s): "INR" in the last 72 hours.         Imaging/Other results:  Imaging Results (Last 48 hours)  CT HEAD WO CONTRAST ( ) Result Date: 04/11/2023 CLINICAL DATA:  Provided history: Weakness. EXAM: CT HEAD WITHOUT CONTRAST TECHNIQUE: Contiguous axial images were obtained from the base of the skull through the vertex without intravenous contrast. RADIATION DOSE REDUCTION: This exam was performed according to the departmental dose-optimization program which includes automated exposure control, adjustment of the mA and/or kV according to patient size and/or use of  iterative reconstruction technique. COMPARISON:  Brain MRI 04/11/2023. Non-contrast head CT and CT angiogram head/neck 04/01/2023. FINDINGS: Brain: Mild generalized cerebral atrophy. Known 8 mm acute parenchymal hemorrhage at the right thalamocapsular junction/right cerebral peduncle. Mild surrounding edema, unchanged. Known acute/subacute infarcts within the brainstem, left middle cerebellar peduncle and left cerebellar hemisphere, occult by CT and better appreciated on the brain MRI performed earlier today. Known small chronic cortical infarct within the left occipital lobe. Patchy and ill-defined hypoattenuation within the cerebral white matter, nonspecific but compatible with mild chronic small vessel ischemic disease. No extra-axial fluid collection. No evidence of an intracranial mass. No midline shift. Vascular: No hyperdense vessel. Vascular stent within the basilar artery. Atherosclerotic calcifications. Skull: No calvarial fracture or aggressive osseous lesion. Sinuses/Orbits: No mass or acute finding within the imaged orbits. Minimal mucosal thickening scattered within the paranasal sinuses. IMPRESSION: 1. Known 8 mm acute parenchymal hemorrhage at the right thalamocapsular junction/right cerebral peduncle. Mild surrounding edema, unchanged. 2. Known acute/subacute infarcts within the brainstem, left middle cerebellar peduncle and left cerebellar hemisphere, occult by CT and better appreciated on the brain MRI performed earlier today. 3. 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 Electronically Signed   By: Jackey Loge D.O.   On: 04/11/2023 09:01    MR BRAIN WO CONTRAST Addendum Date: 04/11/2023 ADDENDUM REPORT: 04/11/2023 06:47 ADDENDUM: MRI and MRA findings were discussed by telephone with Dr. Tereasa Coop on 04/11/2023 6440 hours. Electronically Signed   By: Odessa Fleming M.D.   On: 04/11/2023 06:47    Result Date:  04/11/2023 CLINICAL DATA:  62 year old male with scattered brainstem and left cerebellar hemisphere infarcts earlier this month, left vertebral artery occlusion, severe basilar artery stenosis/pre-occlusion status post stent assisted Basilar angioplasty on 04/04/2023. Neurologic deficit. EXAM: MRI HEAD WITHOUT CONTRAST TECHNIQUE: Multiplanar, multiecho pulse sequences of the brain and surrounding structures were obtained without intravenous contrast. COMPARISON:  Brain MRI 04/01/2023. Intracranial MRA today reported separately. FINDINGS: Brain: Expected evolution of scattered, patchy brainstem and left cerebellar hemisphere infarcts since 04/01/2023. New DWI susceptibility at the right cerebral peduncle with surrounding patchy T2 and FLAIR hyperintensity (series 7, image 17, series 8, image 45) compatible with intra-axial hemorrhage estimated at about 8 mm diameter. No significant mass effect. No other new diffusion abnormality when compared to 04/01/2023. No other intracranial hemorrhage identified. No IVH or ventriculomegaly. Stable other supratentorial gray and white matter signal, widely  scattered nonspecific white matter T2 and FLAIR hyperintensity. An small area of left superior occipital pole encephalomalacia. No midline shift or intracranial mass effect. Basilar cisterns remain normal. Negative pituitary and cervicomedullary junction. Vascular: Continued absence of the distal left vertebral artery flow void. But improved basilar artery flow void since 04/01/2023. Other Major intracranial vascular flow voids are preserved. Skull and upper cervical spine: Stable, negative. Sinuses/Orbits: Stable, negative. Other: Mastoids remain well aerated. Grossly normal visible internal auditory structures. Stable visible scalp and face. IMPRESSION: 1. MRI evidence of acute intra-axial hemorrhage at the right cerebral peduncle, proximally 8 mm. Mild surrounding edema. No significant mass effect. No intraventricular or  extra-axial extension identified. 2. Otherwise expected evolution of brainstem and left cerebellar infarcts since 04/01/2023. No other acute intracranial abnormality. 3. Improved basilar artery flow void since 04/01/2023, evidence of continued occlusion distal left vertebral artery. See MRA today reported separately. Electronically Signed: By: Odessa Fleming M.D. On: 04/11/2023 06:21    MR ANGIO HEAD WO CONTRAST Result Date: 04/11/2023 CLINICAL DATA:  62 year old male with scattered brainstem and left cerebellar hemisphere infarcts earlier this month, left vertebral artery occlusion, severe basilar artery stenosis/pre-occlusion status post stent assisted Basilar angioplasty on 04/04/2023. Neurologic deficit. EXAM: MRA HEAD WITHOUT CONTRAST TECHNIQUE: Angiographic images of the Circle of Willis were acquired using MRA technique without intravenous contrast. COMPARISON:  Brain MRI today. Pretreatment CTA head and neck 04/01/2023. FINDINGS: Anterior circulation: Tortuous distal cervical ICAs and antegrade flow signal throughout both ICA siphons. Ophthalmic artery origins appear normal. Mild to moderate narrowing of the supraclinoid ICAs appears stable. Patent carotid termini, MCA and ACA origins. Visible ACA and MCA branches appears stable. Posterior circulation: No distal left vertebral artery antegrade flow signal. Antegrade flow in the distal right vertebral artery appears stable to the vertebrobasilar junction. Right PICA origin is patent. New proximal basilar artery susceptibility artifact compatible with new vascular stent (series 3, image 37) with maintained antegrade flow in the distal basilar artery indicating stent patency. SCA and PCA origins are patent. Posterior communicating arteries are diminutive or absent. Bilateral PCA branches appear improved since 04/01/2023. Anatomic variants: None significant. Other: Subtle new susceptibility artifact at the right midbrain, cerebral peduncle. See MRI today reported  separately. IMPRESSION: 1. Unchanged distal left vertebral artery occlusion. Distal right vertebral artery remains patent. Patent proximal Basilar artery stent and improved distal Basilar, bilateral SCA and PCA flow related enhancement since 03/31/2061. 2. Stable anterior circulation, mild to moderate bilateral supraclinoid ICA atherosclerotic stenosis. 3. Right cerebral peduncle hemorrhage on MRI today is reported separately. Electronically Signed   By: Odessa Fleming M.D.   On: 04/11/2023 06:26    CT CHEST ABDOMEN PELVIS W CONTRAST Result Date: 04/11/2023 CLINICAL DATA:  Dark stools, hematemesis and generalized weakness left upper extremity. Being evaluated for sepsis. Checking for source. 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 EXAM: CT CHEST, ABDOMEN, AND PELVIS WITH CONTRAST TECHNIQUE: Multidetector CT imaging of the chest, abdomen and pelvis was performed following the standard protocol during bolus administration of intravenous contrast. RADIATION DOSE REDUCTION: This exam was performed according to the departmental dose-optimization program which includes automated exposure control, adjustment of the mA and/or kV according to patient size and/or use of iterative reconstruction technique. CONTRAST:  75mL OMNIPAQUE IOHEXOL 350 MG/ML SOLN COMPARISON:  No recent chest x-ray. Comparison is made with the abdomen and pelvis CT with contrast 11/12/2021 and a chest CT with contrast 09/11/2006. FINDINGS: CT CHEST FINDINGS  Cardiovascular: The cardiac size is normal. There is no pericardial effusion. The pulmonary arteries and veins are normal in caliber. The pulmonary arteries are centrally clear. Small amount of scattered aortic and great vessel atherosclerosis. There is no stenosis or dissection. The aortic root measures 4.0 cm at the sinuses of Valsalva on 6:107. The mid ascending segment 4.1 cm on 6:111. Annual follow-up imaging recommended.  Mediastinum/Nodes: No enlarged mediastinal, hilar, or axillary lymph nodes. The lower poles of the thyroid gland, trachea, and esophagus demonstrate no significant findings. Lungs/Pleura: There is diffuse bronchial thickening. There is no consolidation, effusion, pneumothorax or visible pulmonary nodule. 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 No acute or other significant osseous findings. Unremarkable chest wall. CT ABDOMEN PELVIS FINDINGS Hepatobiliary: No focal liver abnormality is seen. No calcified gallstones, gallbladder wall thickening, or biliary dilatation. Pancreas: Questionable haziness at the pancreatic tail versus artifact due to beam hardening from the patient's arms in the field. Correlate with serum lipase for possible significance. There is no pancreatic mass enhancement or ductal dilatation, no other focal abnormality. Spleen: No abnormality. Adrenals/Urinary Tract: Adrenal glands are unremarkable. Kidneys are normal, without renal calculi, focal lesion, or hydronephrosis. Bladder is unremarkable. Stomach/Bowel: There is fluid filling of the nondilated stomach. Normal wall thickness. The unopacified small bowel is normal caliber without mesenteric inflammatory change. The appendix is normal. There is scattered colonic diverticulosis without evidence of diverticulitis. Vascular/Lymphatic: Aortic atherosclerosis. No enlarged abdominal or pelvic lymph nodes. Reproductive: There is mild prostatomegaly, stable. Other: No abdominal wall hernia or abnormality. No abdominopelvic ascites. There is no free hemorrhage or free air. Musculoskeletal: Facet hypertrophy and spondylosis lumbar spine. No acute or other significant osseous findings. Mild hip DJD. Moderate acquired spinal stenosis L4-5. IMPRESSION: 1. Questionable haziness at the pancreatic tail versus artifact due to beam hardening from the patient's arms in the field. Correlate with serum  lipase for possible significance. 2. Bronchitis without evidence of pneumonia. 3. Aortic atherosclerosis. 4. 4.0 cm aortic root and 4.1 cm mid ascending segment. Recommend annual imaging followup by CTA or MRA. This recommendation follows 2010 ACCF/AHA/AATS/ACR/ASA/ SCA/SCAI/SIR/STS/SVM Guidelines for the Diagnosis and Management of Patients with Thoracic Aortic Disease. Circulation. 2010; 121: Z610-R604. Aortic aneurysm NOS (ICD10-I71.9). 5. Diverticulosis without evidence of diverticulitis. 6. Mild prostatomegaly. 7. Moderate acquired spinal stenosis L4-5. Aortic Atherosclerosis (ICD10-I70.0). Electronically Signed   By: Almira Bar M.D.   On: 04/11/2023 01:47           Assessment and Plan:   62 year old male with recent CVA (Dec 20) treated with basilar artery stent, readmitted with 2 days of melena/coffee ground emesis and 4 point drop in hemoglobin with elevated BUN, consistent with upper GI bleed. He was hypotensive on arrival but responded to IV fluids and has been hemodynamically stable since then. His hemoglobin has been stable for 2 days and his BUN has normalized.  This likely indicates that his bleeding has stopped for the moment. He was started on heparin drip yesterday morning, and has not shown any evidence of rebleeding since then. If he rebleeds, I think we will likely need to perform an upper endoscopy to try to achieve hemostasis. Given absence of any evidence of bleeding, I am okay with advancing diet to regular today.   Upper GI bleed in setting of DAPT - Continue twice daily PPI IV, transition to p.o. PPI at discharge, with plans for 8 weeks twice daily dosing, followed by once daily dosing indefinitely given upper GI  bleed and need for chronic anticoagulation - Continue Carafate p.o. 4 times daily for 2 weeks total - Okay to advance to regular today - Continue to monitor closely for active bleeding - Will likely require upper endoscopy if the patient shows signs of  rebleeding    Recent stroke with basilar artery stent and small acute intra-axial hemorrhage - Continuing aspirin - Brilinta being held since admission - On heparin drip (started December 28), plan for 4-5 days of heparin followed by transition to Plavix vs Brillinta 45 bid - Neurochecks per neurology     GI will sign off at this time.  Please reconsult if patient shows any evidence of recurrent bleeding.     Jenel Lucks, MD  04/13/2023, 11:20 AM [facility]

## 2023-04-13 NOTE — Progress Notes (Signed)
[facility] PROGRESS NOTE  TREVAHN WEISMILLER (DOB: 16-Apr-1960) ZOX:[id number] PCP: Simonne Martinet, MD  Brief Narrative: Gavin Dorsey is a 62 y.o. 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 Was found to be tachycardic, hypotensive which improved with IV fluids. He was afebrile though WBC 17k thought to represent sepsis from uncertain source, so was given vancomycin/cefepime/flagyl. Hemoglobin was 12.1 from 15.5 most recently and subsequently declined to 8.0g/dl consistent with ABLA with +FOBT, BUN of 71 (from 16) due to GI bleeding. GI was consulted, PPI and carafate started, EGD deferred. Aspirin was continued and brilinta held in this setting.   Repeat neuroimaging revealed patent basilar artery stent, unchanged distal left vertebral artery occlusion, and new intra-axial hemorrhage with surrounding edema without mass effect for which neurology was consulted. Repeat CT head showed stability. Multidisciplinary decision was made to initiate anticoagulation with heparin bolus and monitor very closely.  CT C/A/P revealed questionable pancreatic tail haziness, bronchitis without pneumonia, diverticulosis without diverticulitis among other findings.   Subjective: No complaints, no BM today, eating ok. No abd pain. No chest pain or dyspnea. Stable weakness.  Objective: BP 109/71   Pulse 74   Temp 98.7 F (37.1 C) (Oral)   Resp 16   Ht 5\' 8"  (1.727 m)   Wt 102.1 kg   SpO2 93%   BMI 34.22 kg/m   Gen: No distress Pulm: Clear, nonlabored  CV: RRR, no MRG or edema GI: Soft, NT, ND, +BS  Neuro: Alert and oriented. No new focal deficits. Ext: Warm, no deformities. Skin: No rashes, lesions or ulcers on visualized skin   Assessment & Plan: Acute blood loss anemia due to upper GI bleed:  - Continue serial H/H this  PM especially in light of heparin gtt (no bolus) and ASA use.  - Keep T&S UTD (update 12/30).   Upper GI bleeding: Suggested by melena, +FOBT, elevated BUN. Diverticulosis on CT without inflammation. Clinically shows evidence of having stopped bleeding. Hemodynamics are stable. BUN returned to normal.  - Appears we will be able to avoid inpatient EGD, GI consult appreciated, ok'ed unrestricted diet.  - Continue IV PPI BID, carafate QID (no Tx PTA).   Recent ischemic CVAs s/p basilar artery angioplasty/stent:  - Continue aspirin - Started heparin gtt without bolus 12/28 to maintain stent patency. Very difficult situation. Per neurology, Plan to continue heparin IV for 4 to 5 days, if stable then switch to Brilinta 45 twice daily or Plavix 75 daily with checking P2 Y12 levels. - Neurochecks  Acute bronchitis: I don't suspect sepsis. No lactic acidosis, leukocytosis can be attributed to hemoconcentration, cultures are negative (will continue monitoring), tachycardia and hypotension responded briskly to IVF.  - Avoid steroids in setting of GIB.  - Respiratory status is stable. No wheezing. - Continue empiric azithromycin. RSV, covid-19, flu PCRs negative  Chronic HFpEF: LVEF 55-60%, G1DD on recent echo. Compensated.  - Does not appear to be on standing medications for this.    NIDT2DM: Well-controlled with HbA1c 5.2%.  - Continue SSI. No home meds noted.  HLD:  - Continue statin   AKI: Resolved.  - Avoid nephrotoxins.   4.1cm aortic root dilatation:  - Recommend annual imaging by CTA or MRA.   Tyrone Nine, MD [facility] [email or web address] 04/13/2023, 3:51 PM

## 2023-04-13 NOTE — Progress Notes (Signed)
PHARMACY - ANTICOAGULATION CONSULT NOTE  Pharmacy Consult for heparin Indication: s/p IR neuro revascularization, high risk stent occlusion   No Known Allergies  Patient Measurements: Height: 5\' 8"  (172.7 cm) Weight: 102.1 kg (225 lb 1.4 oz) IBW/kg (Calculated) : 68.4 Heparin Dosing Weight: 90 kg  Vital Signs: Temp: 98.7 F (37.1 C) (12/29 0400) Temp Source: Oral (12/29 0400)  Labs: Recent Labs    04/11/23 1517 04/11/23 1708 04/12/23 0311 04/12/23 1100 04/12/23 1641 04/12/23 1918 04/13/23 0251  HGB  --  8.3* 8.0* 8.0*  --  7.6* 7.4*  HCT  --  25.4* 24.1* 23.9*  --  23.0* 21.9*  PLT  --  222 206  --   --   --  214  HEPARINUNFRC  --   --   --   --  <0.10*  --  0.11*  CREATININE 1.37*  --  1.25*  --   --   --  1.22    Estimated Creatinine Clearance: 72.7 mL/min (by C-G formula based on SCr of 1.22 mg/dL).   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 Patient was on Brilinta and aspirin secondary to recent CVA and presented with upper GI bleed with hematemesis and melena. Brilinta held and ASA resumed on 12/27. Pharmacy consulted to start heparin with no bolus and low goal.  This morning, heparin level is 0.11 (therapeutic but at lower end of goal). CBC okay - hgb 7.4 and plts 214. No issues with infusion or bleeding per nursing team.  Goal of Therapy:  Heparin level 0.1-0.25 units/ml Monitor platelets by anticoagulation protocol: Yes   Plan:  Increase heparin to 950 units/hr Monitor daily heparin level, CBC, signs/symptoms of bleeding  Follow-up plans to start Plavix or Brilinta   Roslyn Smiling, PharmD PGY1 Pharmacy Resident 04/13/2023 9:08 AM

## 2023-04-13 NOTE — Progress Notes (Signed)
STROKE TEAM PROGRESS NOTE   BRIEF HPI Gavin Dorsey is a 62 y.o. male with history of HTN, HLD, DM2, recent acute and subacute brainstem and left cerebellar hemisphere infarctions 04/01/23 s/p basilar angioplasty and stenting with discharge on 12/22 on aspirin and Brilinta, statin presenting with dark stools, recent black bilious emesis, and left arm weakness. Initial evaluation reveals a + FOBT and neuroimaging revealed an acute intra-axial hemorrhage at the right cerebral peduncle with mild surrounding edema and no significant mass effect and was admitted for further evaluation.   SIGNIFICANT HOSPITAL EVENTS 12/27 - Admission for +FOBT and ICH - MRI brain 05:00 acute intra-axial hemorrhage at the right cerebral peduncle approximately 8 mm with mild surrounding edema and no significant mass effect - CT head stability scan 08:40 with stable ICH - Patient reports no vomiting in 12 hours, Hgb 15.5 on previous discharge, 12.1 on admission  - 12/28 on liquid diet, and heparin IV started on top of ASA 81  INTERIM HISTORY/SUBJECTIVE No family at bedside.  Neuro stable, intact.  No active GI bleeding.  GI had advanced pt diet. Now on heparin IV.  OBJECTIVE CBC    Component Value Date/Time   WBC 8.6 04/13/2023 0251   RBC 2.56 (L) 04/13/2023 0251   HGB 7.7 (L) 04/13/2023 1726   HGB 15.9 07/27/2011 1426   HCT 23.3 (L) 04/13/2023 1726   HCT 54.3 (H) 03/06/2023 1449   PLT 214 04/13/2023 0251   PLT 240 07/27/2011 1426   MCV 85.5 04/13/2023 0251   MCV 88 07/27/2011 1426   MCH 28.9 04/13/2023 0251   MCHC 33.8 04/13/2023 0251   RDW 16.3 (H) 04/13/2023 0251   RDW 13.5 07/27/2011 1426   LYMPHSABS 2.3 04/13/2023 0251   MONOABS 0.6 04/13/2023 0251   EOSABS 0.5 04/13/2023 0251   BASOSABS 0.1 04/13/2023 0251   BMET    Component Value Date/Time   NA 139 04/13/2023 0251   NA 139 07/27/2011 1426   K 3.5 04/13/2023 0251   K 3.6 07/27/2011 1426   CL 107 04/13/2023 0251   CL 104 07/27/2011  1426   CO2 26 04/13/2023 0251   CO2 27 07/27/2011 1426   GLUCOSE 84 04/13/2023 0251   GLUCOSE 123 (H) 07/27/2011 1426   BUN 14 04/13/2023 0251   BUN 17 07/27/2011 1426   CREATININE 1.22 04/13/2023 0251   CREATININE 1.27 07/27/2011 1426   CALCIUM 8.0 (L) 04/13/2023 0251   CALCIUM 8.3 (L) 07/27/2011 1426   GFRNONAA >60 04/13/2023 0251   GFRNONAA >60 07/27/2011 1426   Lab Results  Component Value Date   HGBA1C 5.2 04/01/2023   Lab Results  Component Value Date   CHOL 148 04/02/2023   HDL 27 (L) 04/02/2023   LDLCALC 101 (H) 04/02/2023   TRIG 96 04/06/2023   CHOLHDL 5.5 04/02/2023   IMAGING past 24 hours No results found.  Vitals:   04/12/23 2037 04/13/23 0000 04/13/23 0400 04/13/23 0800  BP: (!) 106/58   109/71  Pulse: 82   74  Resp: 18 18  16   Temp: 98.4 F (36.9 C)  98.7 F (37.1 C)   TempSrc: Oral  Oral   SpO2:    93%  Weight:      Height:       PHYSICAL EXAM General:  Alert, well-nourished, well-developed patient in no acute distress laying in ER stretcher Psych:  Mood and affect appropriate for situation, patient is calm and cooperative with exam CV: Regular rate and rhythm  on bedside cardiac monitor Respiratory:  Regular, unlabored respirations on room air GI: Abdomen soft, rounded  NEURO:  Mental Status: AA&Ox3, patient is able to give clear and coherent history Speech/Language: speech is without dysarthria or aphasia.  Naming, repetition, fluency, and comprehension intact.  Cranial Nerves:  II: PERRL. Visual fields full.  III, IV, VI: EOMI. Eyelids elevate symmetrically.  V: Sensation is intact to light touch and symmetrical to face.  VII: Face is symmetrical resting and and with movement VIII: Hearing is intact to voice. IX, X: Palate elevates symmetrically. Phonation is normal.  XI: Shoulder shrug 5/5. XII: Tongue is midline without fasciculations. Motor: 5/5 strength to all muscle groups tested.  Tone: is normal and bulk is normal Sensation:  Intact to light touch bilaterally. Extinction absent to light touch to DSS.   Coordination: FTN intact bilaterally (initially there seemed to be some past pointing in the left upper extremity but with repeat examination, this improved), HKS: no ataxia in BLE.No drift.  Gait: Deferred  ASSESSMENT/PLAN  Intracerebral Hemorrhage:  acute intra-axial hemorrhage at the right cerebral peduncle, approximately 8 mm with mild surrounding edema Etiology:  Likely 2/2 DAPT from recent basilar stenting   CT Head (stability scan): Known 8 mm acute parenchymal hemorrhage at the right thalamic capsular junction/right cerebral peduncle.  Mild surrounding edema, unchanged.  Small chronic left occipital lobe cortical infarct. MRI : Acute small ICH at the right cerebral peduncle, approximately 8 mm.  Mild surrounding edema.  No significant mass effect.  No intraventricular or extra-axial extension identified.  Otherwise expected evolution of brainstem and left cerebellar infarcts since 04/01/2023.  No other acute intracranial abnormality.  Improved basilar artery flow void since 04/01/2023, evidence of continued occlusion distal left vertebral artery. MRA : Unchanged distal left vertebral artery occlusion.  Distal right vertebral artery remains patent.  Patent proximal basilar artery stent and improved distal basilar, bilateral SCA and PCA flow related enhancement since 04/01/2023.  Stable anterior circulation, mild to moderate bilateral supraclinoid ICA atherosclerotic stenosis.  2D Echo 04/02/23: LVEF 55 to 60%. LDL 101 HgbA1c 5.2 VTE prophylaxis - SCDs due to ICH and GIB aspirin 81 mg daily and Brilinta (ticagrelor) 90 mg bid prior to admission, now on aspirin 81 mg daily and heparin IV per stroke protocol given high risk of stent occlusion. 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 Corliss Skains  Near occlusion of the  right vertebral basilar junction just distal to the right posterior inferior cerebellar  artery with a string sign opacification of the proximal one third to half  of the basilar artery.  Distal one third of the basilar artery opacifies  retrogradely via a severely diseased left posterior communicating artery.   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 loaded with brilinta 12/19, discharged home on Brilinta and ASA DAPT Stent assisted angioplasty of basilar artery performed 12/20 Now on aspirin 81 and heparin IV.  Plan to continue heparin IV for total 4-5 days, if stable then switch to Brilinta 45 twice daily or Plavix 75 daily while checking P2 Y12 levels.  GIB Likely due to brilinta use recent hematemesis, melena FOBT +  HGB 15.5--12.1--8.6--8.0--7.6--7.4--7.7 Hold Brilinta On liquid diet PPI GI on board, no procedure needed at this time  Hypertensive during recent admission (12/17-12/22 requiring cleviprex and labetalol) Hypotensive blood pressure during current admission, query possible sepsis Home meds:  None Stable on the low end BP goal normotensive  Hyperlipidemia Home meds:  Crestor 40 mg daily LDL 101, goal < 70 Crestor resumed Continue statin at discharge  Diabetes type II Controlled Non-insulin dependent  Home meds:  None HgbA1c 5.2, goal < 7.0 CBGs SSI as needed Close PCP follow up for DM control  Other  Stroke Risk Factors ETOH use, alcohol level <10, advised to drink no more than 2 drink(s) a day Obesity, Body mass index is 34.22 kg/m., BMI >/= 30 associated with increased stroke risk, recommend weight loss, diet and exercise as appropriate   Other Active Problems leukocytosis WBC 17.0->10.6->10.3 Questionable UA with large leukocytes, small bacteria. Pending culture.  CXR with findings concerning for bronchitis  Blood cultures NGTD Respiratory panel unremarkable  Afebrile  CKD stage IIIa Cr 1.61--1.56--1.37--1.25--1.22  Hospital day # 2    Marvel Plan, MD PhD Stroke Neurology 04/13/2023 6:57 PM     To contact Stroke Continuity provider, please refer to [email or web address]. After hours, contact General Neurology

## 2023-04-14 DIAGNOSIS — I610 Nontraumatic intracerebral hemorrhage in hemisphere, subcortical: Secondary | ICD-10-CM

## 2023-04-14 LAB — BASIC METABOLIC PANEL
Anion gap: 9 (ref 5–15)
BUN: 10 mg/dL (ref 8–23)
CO2: 26 mmol/L (ref 22–32)
Calcium: 8 mg/dL — ABNORMAL LOW (ref 8.9–10.3)
Chloride: 104 mmol/L (ref 98–111)
Creatinine, Ser: 1.34 mg/dL — ABNORMAL HIGH (ref 0.61–1.24)
GFR, Estimated: 60 mL/min — ABNORMAL LOW (ref >60)
Glucose, Bld: 85 mg/dL (ref 70–99)
Potassium: 3.7 mmol/L (ref 3.5–5.1)
Sodium: 139 mmol/L (ref 135–145)

## 2023-04-14 LAB — HEPARIN LEVEL (UNFRACTIONATED): Heparin Unfractionated: 0.13 [IU]/mL — ABNORMAL LOW (ref 0.30–0.70)

## 2023-04-14 LAB — TYPE AND SCREEN
ABO/RH(D): O NEG
Antibody Screen: NEGATIVE

## 2023-04-14 LAB — CBC WITH DIFFERENTIAL/PLATELET
Abs Immature Granulocytes: 0.06 10*3/uL (ref 0.00–0.07)
Basophils Absolute: 0.1 10*3/uL (ref 0.0–0.1)
Basophils Relative: 1 %
Eosinophils Absolute: 0.4 10*3/uL (ref 0.0–0.5)
Eosinophils Relative: 4 %
HCT: 22.7 % — ABNORMAL LOW (ref 39.0–52.0)
Hemoglobin: 7.5 g/dL — ABNORMAL LOW (ref 13.0–17.0)
Immature Granulocytes: 1 %
Lymphocytes Relative: 26 %
Lymphs Abs: 2.6 10*3/uL (ref 0.7–4.0)
MCH: 28.4 pg (ref 26.0–34.0)
MCHC: 33 g/dL (ref 30.0–36.0)
MCV: 86 fL (ref 80.0–100.0)
Monocytes Absolute: 0.8 10*3/uL (ref 0.1–1.0)
Monocytes Relative: 7 %
Neutro Abs: 6.3 10*3/uL (ref 1.7–7.7)
Neutrophils Relative %: 61 %
Platelets: 253 10*3/uL (ref 150–400)
RBC: 2.64 MIL/uL — ABNORMAL LOW (ref 4.22–5.81)
RDW: 16.3 % — ABNORMAL HIGH (ref 11.5–15.5)
WBC: 10.2 10*3/uL (ref 4.0–10.5)
nRBC: 0 % (ref 0.0–0.2)

## 2023-04-14 LAB — MAGNESIUM: Magnesium: 1.9 mg/dL (ref 1.7–2.4)

## 2023-04-14 LAB — GLUCOSE, CAPILLARY
Glucose-Capillary: 94 mg/dL (ref 70–99)
Glucose-Capillary: 107 mg/dL — ABNORMAL HIGH (ref 70–99)

## 2023-04-14 LAB — PHOSPHORUS: Phosphorus: 3 mg/dL (ref 2.5–4.6)

## 2023-04-14 NOTE — Progress Notes (Addendum)
[facility] PROGRESS NOTE  Gavin Dorsey (DOB: [birth year]) YNW:[id number] PCP: Simonne Martinet, MD  Brief Narrative: Gavin Dorsey is a 62 y.o. 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 Was found to be tachycardic, hypotensive which improved with IV fluids. He was afebrile though WBC 17k thought to represent sepsis from uncertain source, so was given vancomycin/cefepime/flagyl. Hemoglobin was 12.1 from 15.5 most recently and subsequently declined to 8.0g/dl consistent with ABLA with +FOBT, BUN of 71 (from 16) due to GI bleeding. GI was consulted, PPI and carafate started, EGD deferred. Aspirin was continued and brilinta held in this setting.   Repeat neuroimaging revealed patent basilar artery stent, unchanged distal left vertebral artery occlusion, and new intra-axial hemorrhage with surrounding edema without mass effect for which neurology was consulted. Repeat CT head showed stability. Multidisciplinary decision was made to initiate anticoagulation with heparin bolus and monitor very closely.  CT C/A/P revealed questionable pancreatic tail haziness, bronchitis without pneumonia, diverticulosis without diverticulitis among other findings.   Subjective: Sleeping, only complaint is being awoken for AM labs. Denies any bleeding or pain anywhere. Denies dyspnea. No new weakness or numbness. Eating fine, glad he's back on regular diet as of last night.  Objective: BP 105/73 (BP Location: Right Arm)   Pulse 76   Temp 97.8 F (36.6 C) (Oral)   Resp 20   Ht 5\' 8"  (1.727 m)   Wt 102.1 kg   SpO2 98%   BMI 34.22 kg/m   Gen: No distress Pulm: Clear and nonlabored  CV: RRR, no MRG or pitting edema GI: Soft, NT, ND, +BS Neuro: Alert and oriented. No new focal deficits. Ext: Warm, no deformities Skin: No rashes,  lesions or ulcers on visualized skin   Assessment & Plan: Acute blood loss anemia due to upper GI bleed:  - Serial H/H has shown stability with hgb in 7's. Pt asymptomatic. Will continue monitoring in AM or more frequent if bleeding or vital sign changes develop.  - Keep T&S UTD (updated 12/30). Pt does consent to transfusion if hgb < 7g/dl or symptoms or active bleeding.  Upper GI bleeding: Suggested by melena, +FOBT, elevated BUN. Diverticulosis on CT without inflammation. Clinically shows evidence of having stopped bleeding. Hemodynamics are stable. BUN returned to normal, down to 10 today.  - Appears we will be able to avoid inpatient EGD, GI consult appreciated, ok'ed unrestricted diet and signed off 12/29. - Continue IV PPI BID, carafate QID (no Tx PTA).   Recent ischemic CVAs s/p basilar artery angioplasty/stent: Symptoms stable. - Continue aspirin - Started heparin gtt without bolus 12/28 to maintain stent patency. Very difficult situation. Per neurology, plan to continue heparin gtt 4-5 days (4 days will be in the morning of 1/1) and if stable then switch to Brilinta 45 twice daily or Plavix 75 daily and checking P2 Y12 levels. - Neurochecks  Acute bronchitis: I don't suspect sepsis. No lactic acidosis, leukocytosis can be attributed to hemoconcentration, cultures are negative (will continue monitoring), tachycardia and hypotension responded briskly to IVF.  - Avoid steroids in setting of GIB.  - Respiratory status is stable. No wheezing. - Continue empiric azithromycin. RSV, covid-19, flu PCRs negative - He's being placed on supplemental oxygen nocturnally, suspect nocturnal hypoxemia possibly related to undiagnosed sleep apnea. Suggest sleep study after discharge.   Chronic HFpEF: LVEF 55-60%,  G1DD on recent echo. Compensated.  - Does not appear to be on standing medications for this.    NIDT2DM: Well-controlled with HbA1c 5.2%.  - Continue SSI. No home meds noted.  HLD:  -  Continue statin   AKI: Resolved.  - Avoid nephrotoxins.   4.1cm aortic root dilatation:  - Recommend annual imaging by CTA or MRA.   Tyrone Nine, MD [facility] [email or web address] 04/14/2023, 8:30 AM

## 2023-04-14 NOTE — Progress Notes (Addendum)
STROKE TEAM PROGRESS NOTE   BRIEF HPI [name] is a 62 y.o. male with history of HTN, HLD, DM2, recent acute and subacute brainstem and left cerebellar hemisphere infarctions 04/01/23 s/p basilar angioplasty and stenting with discharge on 12/22 on aspirin and Brilinta, statin presenting with dark stools, recent black bilious emesis, and left arm weakness. Initial evaluation reveals a + FOBT and neuroimaging revealed an acute intra-axial hemorrhage at the right cerebral peduncle with mild surrounding edema and no significant mass effect and was admitted for further evaluation.   SIGNIFICANT HOSPITAL EVENTS 12/27 - Admission for +FOBT and ICH - MRI brain 05:00 acute intra-axial hemorrhage at the right cerebral peduncle approximately 8 mm with mild surrounding edema and no significant mass effect - CT head stability scan 08:40 with stable ICH - Patient reports no vomiting in 12 hours, Hgb 15.5 on previous discharge, 12.1 on admission  - 12/28 on liquid diet, and heparin IV started on top of ASA 81  INTERIM HISTORY/SUBJECTIVE No family at bedside.  Neuro stable, intact.  Hb still lowish. But no over bleeding. No active GI bleeding.  Ate well. Still on heparin IV. Plan to change to po antiplatelet Wednesday if stable.   OBJECTIVE CBC    Component Value Date/Time   WBC 10.2 04/14/2023 0439   RBC 2.64 (L) 04/14/2023 0439   HGB 7.5 (L) 04/14/2023 0439   HGB 15.9 07/27/2011 1426   HCT 22.7 (L) 04/14/2023 0439   HCT 54.3 (H) 03/06/2023 1449   PLT 253 04/14/2023 0439   PLT 240 07/27/2011 1426   MCV 86.0 04/14/2023 0439   MCV 88 07/27/2011 1426   MCH 28.4 04/14/2023 0439   MCHC 33.0 04/14/2023 0439   RDW 16.3 (H) 04/14/2023 0439   RDW 13.5 07/27/2011 1426   LYMPHSABS 2.6 04/14/2023 0439   MONOABS 0.8 04/14/2023 0439   EOSABS 0.4 04/14/2023 0439   BASOSABS 0.1 04/14/2023 0439   BMET    Component Value Date/Time   NA 139 04/14/2023 0439   NA 139 07/27/2011 1426   K 3.7  04/14/2023 0439   K 3.6 07/27/2011 1426   CL 104 04/14/2023 0439   CL 104 07/27/2011 1426   CO2 26 04/14/2023 0439   CO2 27 07/27/2011 1426   GLUCOSE 85 04/14/2023 0439   GLUCOSE 123 (H) 07/27/2011 1426   BUN 10 04/14/2023 0439   BUN 17 07/27/2011 1426   CREATININE 1.34 (H) 04/14/2023 0439   CREATININE 1.27 07/27/2011 1426   CALCIUM 8.0 (L) 04/14/2023 0439   CALCIUM 8.3 (L) 07/27/2011 1426   GFRNONAA 60 (L) 04/14/2023 0439   GFRNONAA >60 07/27/2011 1426   Lab Results  Component Value Date   HGBA1C 5.2 04/01/2023   Lab Results  Component Value Date   CHOL 148 04/02/2023   HDL 27 (L) 04/02/2023   LDLCALC 101 (H) 04/02/2023   TRIG 96 04/06/2023   CHOLHDL 5.5 04/02/2023   IMAGING past 24 hours No results found.  Vitals:   04/14/23 0400 04/14/23 0828 04/14/23 1227 04/14/23 1600  BP: (!) 125/54 105/73 122/73   Pulse: 71 76 85   Resp: 18 20 15    Temp: 98.2 F (36.8 C) 97.8 F (36.6 C) 98 F (36.7 C) 98 F (36.7 C)  TempSrc: Oral Oral Oral Oral  SpO2: 100% 98% 99%   Weight:      Height:       PHYSICAL EXAM General:  Alert, well-nourished, well-developed patient in no acute distress laying in  ER stretcher Psych:  Mood and affect appropriate for situation, patient is calm and cooperative with exam CV: Regular rate and rhythm on bedside cardiac monitor Respiratory:  Regular, unlabored respirations on room air GI: Abdomen soft, rounded  NEURO:  Mental Status: AA&Ox3, patient is able to give clear and coherent history Speech/Language: speech is without dysarthria or aphasia.  Naming, repetition, fluency, and comprehension intact.  Cranial Nerves:  II: PERRL. Visual fields full.  III, IV, VI: EOMI. Eyelids elevate symmetrically.  V: Sensation is intact to light touch and symmetrical to face.  VII: Face is symmetrical resting and and with movement VIII: Hearing is intact to voice. IX, X: Palate elevates symmetrically. Phonation is normal.  XI: Shoulder shrug  5/5. XII: Tongue is midline without fasciculations. Motor: 5/5 strength to all muscle groups tested.  Tone: is normal and bulk is normal Sensation: Intact to light touch bilaterally. Extinction absent to light touch to DSS.   Coordination: FTN intact bilaterally (initially there seemed to be some past pointing in the left upper extremity but with repeat examination, this improved), HKS: no ataxia in BLE.No drift.  Gait: Deferred  ASSESSMENT/PLAN  Intracerebral Hemorrhage:  acute intra-axial hemorrhage at the right cerebral peduncle, approximately 8 mm with mild surrounding edema Etiology:  Likely 2/2 DAPT from recent basilar stenting   CT Head (stability scan): Known 8 mm acute parenchymal hemorrhage at the right thalamic capsular junction/right cerebral peduncle.  Mild surrounding edema, unchanged.  Small chronic left occipital lobe cortical infarct. MRI : Acute small ICH at the right cerebral peduncle, approximately 8 mm.  Mild surrounding edema.  No significant mass effect.  No intraventricular or extra-axial extension identified.  Otherwise expected evolution of brainstem and left cerebellar infarcts since 04/01/2023.  No other acute intracranial abnormality.  Improved basilar artery flow void since 04/01/2023, evidence of continued occlusion distal left vertebral artery. MRA : Unchanged distal left vertebral artery occlusion.  Distal right vertebral artery remains patent.  Patent proximal basilar artery stent and improved distal basilar, bilateral SCA and PCA flow related enhancement since 04/01/2023.  Stable anterior circulation, mild to moderate bilateral supraclinoid ICA atherosclerotic stenosis.  2D Echo 04/02/23: LVEF 55 to 60%. LDL 101 HgbA1c 5.2 VTE prophylaxis - SCDs due to ICH and GIB aspirin 81 mg daily and Brilinta (ticagrelor) 90 mg bid prior to admission, now on aspirin 81 mg daily and heparin IV per stroke protocol given high risk of stent occlusion. 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 Corliss Skains  Near occlusion of the  right vertebral basilar junction just distal to the right posterior inferior cerebellar  artery with a string sign opacification of the proximal one third to half  of the basilar artery.  Distal one third of the basilar artery opacifies  retrogradely via a severely diseased left posterior communicating artery.   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 loaded with brilinta 12/19,  discharged home on Brilinta and ASA DAPT Stent assisted angioplasty of basilar artery performed 12/20 Now on aspirin 81 and heparin IV.  Plan to continue heparin IV tomorrow, if stable then on 1/1 switch to Brilinta 45 twice daily or Plavix 75 daily while checking P2 Y12 levels per Dr. Corliss Skains.  GIB Anemia  Likely due to brilinta use recent hematemesis, melena FOBT +  HGB 15.5--12.1--8.6--8.0--7.6--7.4--7.7--7.5 Hold Brilinta On liquid diet PPI GI on board, no procedure needed at this time  Hypertensive during recent admission (12/17-12/22 requiring cleviprex and labetalol) Hypotensive blood pressure during current admission, query possible sepsis Home meds:  None Stable on the low end BP goal normotensive  Hyperlipidemia Home meds:  Crestor 40 mg daily LDL 101, goal < 70 Crestor resumed Continue statin at  discharge  Diabetes type II Controlled Non-insulin dependent  Home meds:  None HgbA1c 5.2, goal < 7.0 CBGs SSI as needed Close PCP follow up for DM control  Other Stroke Risk Factors ETOH use, alcohol level <10, advised to drink no more than 2 drink(s) a day Obesity, Body mass index is 34.22 kg/m., BMI >/= 30 associated with increased stroke risk, recommend weight loss, diet and exercise as appropriate   Other Active Problems leukocytosis WBC 17.0->10.6->10.3->8.6->10.2 Questionable UA with large leukocytes, small bacteria. Pending culture.  CXR with findings concerning for bronchitis  Blood cultures NGTD Respiratory panel unremarkable  Afebrile  CKD stage IIIa Cr 1.61--1.56--1.37--1.25--1.22--1.34 - encourage po intake  Hospital day # 3    Marvel Plan, MD PhD Stroke Neurology 04/14/2023 5:31 PM     To contact Stroke Continuity provider, please refer to [email or web address]. After hours, contact General Neurology

## 2023-04-14 NOTE — Progress Notes (Signed)
Mobility Specialist Progress Note;   04/14/23 1055  Mobility  Activity Ambulated with assistance in hallway  Level of Assistance Contact guard assist, steadying assist  Assistive Device Front wheel walker  Distance Ambulated (ft) 400 ft  Activity Response Tolerated well  Mobility Referral Yes  Mobility visit 1 Mobility  Mobility Specialist Start Time (ACUTE ONLY) 1055  Mobility Specialist Stop Time (ACUTE ONLY) 1113  Mobility Specialist Time Calculation (min) (ACUTE ONLY) 18 min   Pt eager for mobility. Required MinG assistance during ambulation for safety. Verbal cues required throughout as pt often ran into objects in hallway on pts L side. VSS throughout and no c/o during session. Pt returned back to bed with all needs met.   Caesar Bookman Mobility Specialist Please contact via SecureChat or [employer] [phone]

## 2023-04-14 NOTE — Progress Notes (Signed)
PHARMACY - ANTICOAGULATION CONSULT NOTE  Pharmacy Consult for heparin Indication: s/p IR neuro revascularization, high risk stent occlusion   No Known Allergies  Patient Measurements: Height: 5\' 8"  (172.7 cm) Weight: 102.1 kg (225 lb 1.4 oz) IBW/kg (Calculated) : 68.4 Heparin Dosing Weight: 90 kg  Vital Signs: Temp: 97.8 F (36.6 C) (12/30 0828) Temp Source: Oral (12/30 0828) BP: 105/73 (12/30 0828) Pulse Rate: 76 (12/30 0828)  Labs: Recent Labs    04/12/23 0311 04/12/23 1100 04/12/23 1641 04/12/23 1918 04/13/23 0251 04/13/23 1726 04/14/23 0439  HGB 8.0*   < >  --    < > 7.4* 7.7* 7.5*  HCT 24.1*   < >  --    < > 21.9* 23.3* 22.7*  PLT 206  --   --   --  214  --  253  HEPARINUNFRC  --   --  <0.10*  --  0.11*  --  0.13*  CREATININE 1.25*  --   --   --  1.22  --  1.34*   < > = values in this interval not displayed.    Estimated Creatinine Clearance: 66.2 mL/min (A) (by C-G formula based on SCr of 1.34 mg/dL (H)).   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 Patient was on Brilinta and aspirin secondary to recent CVA and presented with upper GI bleed with hematemesis and melena. Brilinta held and ASA resumed on 12/27. Pharmacy consulted to start heparin with no bolus and low goal.  Heparin level continues to be therapeutic (therapeutic but at lower end of goal). CBC okay - hgb 7s and plts wnl. No issues with infusion or bleeding per nursing team.  Goal of Therapy:  Heparin level 0.1-0.25 units/ml Monitor platelets by anticoagulation protocol: Yes   Plan:  Continues heparin 950 units/hr Monitor daily heparin level, CBC, signs/symptoms of bleeding  Follow-up plans to start Plavix or Brilinta   Ulyses Southward, PharmD, BCIDP, AAHIVP,  CPP Infectious Disease Pharmacist 04/14/2023 9:44 AM

## 2023-04-14 NOTE — Plan of Care (Signed)
Pt has rested quietly throughout the night with no distress noted. Alert and oriented. On O2 2LNC. SR on the monitor. Voids per urinal. No neuro deficits. No complaints voiced.     Problem: Clinical Measurements: Goal: Ability to maintain clinical measurements within normal limits will improve Outcome: Progressing   Problem: Clinical Measurements: Goal: Diagnostic test results will improve Outcome: Progressing   Problem: Pain Management: Goal: General experience of comfort will improve Outcome: Progressing

## 2023-04-14 NOTE — Progress Notes (Signed)
Physical Therapy Treatment Patient Details Name: Gavin Dorsey MRN: [id number] DOB: [birth year] Today's Date: 04/14/2023   History of Present Illness 62 y.o. male presents to [facility] hospital 12/26 with dark stools, generalized weakness and focal LUE weakness. Pt admitted with concern for GIB. Of note pt recently admitted 12/18 with brainstem and cerebellar peduncle infarcts, underwent stent placement for basilar artery. PMHx: DM II, HTN, CVA.    PT Comments  The pt was agreeable to session, reports doing well with mobility specialist this morning using RW for improved balance, agreeable to session with focus on balance challenge and testing without use of DME. However, the pt had significant increase in frequency of staggering steps, LOB, and need for min-modA with hallway ambulation without UE support, pt eventually holding IV pole with BUE for support. He scored 3/24 on DGI which indicates he is at great risk for falls, and therefore I recommend continued use of RW for UE support and max OP therapies after d/c. Will continue to benefit from skilled PT acutely to progress functional endurance, stability, safety awareness, and coordination.    Dynamic Gait Index (DGI): 3/24 (<19 indicates increased risk for falls) Pt with loss of balance with head turns, walking around obstacles, stepping over objects, and turning. Test completed without use of DME.    If plan is discharge home, recommend the following: A little help with walking and/or transfers;A little help with bathing/dressing/bathroom;Assistance with cooking/housework;Assist for transportation;Help with stairs or ramp for entrance   Can travel by private vehicle        Equipment Recommendations  None recommended by PT (pt reports having access to a RW)    Recommendations for Other Services       Precautions / Restrictions Precautions Precautions: Fall Precaution Comments: high fall risk Restrictions Weight Bearing Restrictions Per  Provider Order: No     Mobility  Bed Mobility Overal bed mobility: Modified Independent             General bed mobility comments: increased time and effort, assist for line management    Transfers Overall transfer level: Needs assistance Equipment used: None Transfers: Sit to/from Stand Sit to Stand: Supervision           General transfer comment: supervision for safety, improved with UE support    Ambulation/Gait Ambulation/Gait assistance: Min assist, Mod assist Gait Distance (Feet): 200 Feet Assistive device: IV Pole Gait Pattern/deviations: Step-through pattern, Staggering left, Staggering right Gait velocity: decreased Gait velocity interpretation: <1.31 ft/sec, indicative of household ambulator   General Gait Details: pt with multiple instances of staggering to each side during eval, often with increased lateral drift. min-modA to correct staggering, single episode of modA to correct LOB with balance challenge     Balance Overall balance assessment: Needs assistance Sitting-balance support: No upper extremity supported, Feet supported Sitting balance-Leahy Scale: Good     Standing balance support: No upper extremity supported, During functional activity Standing balance-Leahy Scale: Poor Standing balance comment: min-modA to steady with UE support on IV pole. staggering steps to L and R             High level balance activites: Direction changes, Turns, Sudden stops, Head turns High Level Balance Comments: increased assist with challenge, improved stability with BUE support Standardized Balance Assessment Standardized Balance Assessment : Dynamic Gait Index   Dynamic Gait Index Level Surface: Severe Impairment Change in Gait Speed: Moderate Impairment Gait with Horizontal Head Turns: Severe Impairment Gait with Vertical Head Turns: Severe Impairment Gait  and Pivot Turn: Moderate Impairment Step Over Obstacle: Severe Impairment Step Around  Obstacles: Severe Impairment Steps: Moderate Impairment Total Score: 3      Cognition Arousal: Alert Behavior During Therapy: WFL for tasks assessed/performed Overall Cognitive Status: Within Functional Limits for tasks assessed                                 General Comments: poor insight to fall risk and need for assistance        Exercises Other Exercises Other Exercises: standing marches, unable to perform without UE support    General Comments General comments (skin integrity, edema, etc.): VSS on RA      Pertinent Vitals/Pain Pain Assessment Pain Assessment: No/denies pain Pain Intervention(s): Monitored during session     PT Goals (current goals can now be found in the care plan section) Acute Rehab PT Goals Patient Stated Goal: to return to independence PT Goal Formulation: With patient Time For Goal Achievement: 04/26/23 Potential to Achieve Goals: Fair Progress towards PT goals: Progressing toward goals    Frequency    Min 1X/week       AM-PAC PT "6 Clicks" Mobility   Outcome Measure  Help needed turning from your back to your side while in a flat bed without using bedrails?: None Help needed moving from lying on your back to sitting on the side of a flat bed without using bedrails?: None Help needed moving to and from a bed to a chair (including a wheelchair)?: A Little Help needed standing up from a chair using your arms (e.g., wheelchair or bedside chair)?: A Little Help needed to walk in hospital room?: A Little Help needed climbing 3-5 steps with a railing? : A Little 6 Click Score: 20    End of Session Equipment Utilized During Treatment: Gait belt Activity Tolerance: Patient tolerated treatment well Patient left: in bed;with call bell/phone within reach;with bed alarm set Nurse Communication: Mobility status PT Visit Diagnosis: Other abnormalities of gait and mobility (R26.89);Other symptoms and signs involving the nervous  system (R29.898)     Time: 1217-1232 PT Time Calculation (min) (ACUTE ONLY): 15 min  Charges:    $Gait Training: 8-22 mins PT General Charges $$ ACUTE PT VISIT: 1 Visit                     Vickki Muff, PT, DPT   Acute Rehabilitation Department Office [phone] Secure Chat Communication Preferred   Ronnie Derby 04/14/2023, 1:11 PM

## 2023-04-15 DIAGNOSIS — I6529 Occlusion and stenosis of unspecified carotid artery: Secondary | ICD-10-CM

## 2023-04-15 DIAGNOSIS — I613 Nontraumatic intracerebral hemorrhage in brain stem: Secondary | ICD-10-CM

## 2023-04-15 LAB — CBC WITH DIFFERENTIAL/PLATELET
Abs Immature Granulocytes: 0.06 10*3/uL (ref 0.00–0.07)
Basophils Absolute: 0 10*3/uL (ref 0.0–0.1)
Basophils Relative: 0 %
Eosinophils Absolute: 0.4 10*3/uL (ref 0.0–0.5)
Eosinophils Relative: 5 %
HCT: 24.3 % — ABNORMAL LOW (ref 39.0–52.0)
Hemoglobin: 8.1 g/dL — ABNORMAL LOW (ref 13.0–17.0)
Immature Granulocytes: 1 %
Lymphocytes Relative: 29 %
Lymphs Abs: 2.6 10*3/uL (ref 0.7–4.0)
MCH: 28.8 pg (ref 26.0–34.0)
MCHC: 33.3 g/dL (ref 30.0–36.0)
MCV: 86.5 fL (ref 80.0–100.0)
Monocytes Absolute: 0.8 10*3/uL (ref 0.1–1.0)
Monocytes Relative: 9 %
Neutro Abs: 5.2 10*3/uL (ref 1.7–7.7)
Neutrophils Relative %: 56 %
Platelets: 288 10*3/uL (ref 150–400)
RBC: 2.81 MIL/uL — ABNORMAL LOW (ref 4.22–5.81)
RDW: 16.2 % — ABNORMAL HIGH (ref 11.5–15.5)
WBC: 9.1 10*3/uL (ref 4.0–10.5)
nRBC: 0 % (ref 0.0–0.2)

## 2023-04-15 LAB — PHOSPHORUS: Phosphorus: 3.5 mg/dL (ref 2.5–4.6)

## 2023-04-15 LAB — BASIC METABOLIC PANEL
Anion gap: 3 — ABNORMAL LOW (ref 5–15)
BUN: 11 mg/dL (ref 8–23)
CO2: 31 mmol/L (ref 22–32)
Calcium: 8.2 mg/dL — ABNORMAL LOW (ref 8.9–10.3)
Chloride: 106 mmol/L (ref 98–111)
Creatinine, Ser: 1.32 mg/dL — ABNORMAL HIGH (ref 0.61–1.24)
GFR, Estimated: >60 mL/min (ref >60)
Glucose, Bld: 91 mg/dL (ref 70–99)
Potassium: 3.7 mmol/L (ref 3.5–5.1)
Sodium: 140 mmol/L (ref 135–145)

## 2023-04-15 LAB — GLUCOSE, CAPILLARY: Glucose-Capillary: 110 mg/dL — ABNORMAL HIGH (ref 70–99)

## 2023-04-15 LAB — MAGNESIUM: Magnesium: 2 mg/dL (ref 1.7–2.4)

## 2023-04-15 LAB — HEPARIN LEVEL (UNFRACTIONATED): Heparin Unfractionated: 0.17 [IU]/mL — ABNORMAL LOW (ref 0.30–0.70)

## 2023-04-15 MED ORDER — CLOPIDOGREL BISULFATE 75 MG PO TABS
75.0000 mg | ORAL_TABLET | Freq: Every day | ORAL | Status: DC
Start: 2023-04-15 — End: 2023-04-16
  Administered 2023-04-15 – 2023-04-16 (×2): 75 mg via ORAL
  Filled 2023-04-15 (×2): qty 1

## 2023-04-15 NOTE — Progress Notes (Signed)
 Mobility Specialist Progress Note;   04/15/23 1112  Mobility  Activity Ambulated with assistance in hallway  Level of Assistance Contact guard assist, steadying assist  Assistive Device Front wheel walker  Distance Ambulated (ft) 400 ft  Activity Response Tolerated well  Mobility Referral Yes  Mobility visit 1 Mobility  Mobility Specialist Start Time (ACUTE ONLY) 1112  Mobility Specialist Stop Time (ACUTE ONLY) 1137  Mobility Specialist Time Calculation (min) (ACUTE ONLY) 25 min   Pt agreeable to mobility. Required MinG assistance during ambulation for safety. Pt states he will be using RW for a bit after d/c so continued to use RW during session. VSS throughout and no c/o during session. Pt returned back to bed with all needs met.   Lauraine Erm Mobility Specialist Please contact via SecureChat or [employer] [phone]

## 2023-04-15 NOTE — Plan of Care (Signed)
 Pt has rested quietly throughout the night with no distress noted. Alert and oriented. Placed on [facility] when pt sleeping. SR on the monitor. Heparin  at 9cc/H. Voiding per urinal. No complaints voiced.     Problem: Education: Goal: Knowledge of General Education information will improve Description: Including pain rating scale, medication(s)/side effects and non-pharmacologic comfort measures Outcome: Progressing   Problem: Clinical Measurements: Goal: Ability to maintain clinical measurements within normal limits will improve Outcome: Progressing   Problem: Pain Management: Goal: General experience of comfort will improve Outcome: Progressing

## 2023-04-15 NOTE — Progress Notes (Signed)
 Mobility Specialist Progress Note;   04/15/23 1600  Mobility  Activity Transferred from chair to bed  Level of Assistance Contact guard assist, steadying assist  Assistive Device Front wheel walker  Distance Ambulated (ft) 10 ft  Activity Response Tolerated well  Mobility Referral Yes  Mobility visit 1 Mobility  Mobility Specialist Start Time (ACUTE ONLY) 1554  Mobility Specialist Stop Time (ACUTE ONLY) 1601  Mobility Specialist Time Calculation (min) (ACUTE ONLY) 7 min   Answered pts call bell requesting assistance back to bed. Required MinG assist to stand from recliner and ambulate around to other side of bed. No c/o during transfer. Pt left in bed with all needs met, alarm on.   Lauraine Erm Mobility Specialist Please contact via SecureChat or [employer] [phone]

## 2023-04-15 NOTE — Progress Notes (Signed)
 STROKE TEAM PROGRESS NOTE   BRIEF HPI [name] is a 62 y.o. male with history of HTN, HLD, DM2, recent acute and subacute brainstem and left cerebellar hemisphere infarctions 04/01/23 s/p basilar angioplasty and stenting with discharge on 12/22 on aspirin  and Brilinta , statin presenting with dark stools, recent black bilious emesis, and left arm weakness. Initial evaluation reveals a + FOBT and neuroimaging revealed an acute intra-axial hemorrhage at the right cerebral peduncle with mild surrounding edema and no significant mass effect and was admitted for further evaluation.   SIGNIFICANT HOSPITAL EVENTS 12/27 - Admission for +FOBT and ICH - MRI brain 05:00 acute intra-axial hemorrhage at the right cerebral peduncle approximately 8 mm with mild surrounding edema and no significant mass effect - CT head stability scan 08:40 with stable ICH - Patient reports no vomiting in 12 hours, Hgb 15.5 on previous discharge, 12.1 on admission  - 12/28 on liquid diet, and heparin  IV started on top of ASA 81  INTERIM HISTORY/SUBJECTIVE His friend is at bedside.  Neuro stable, intact.  Hb still low 8.1 but no over bleeding. No active GI bleeding.  Ate well.  Will DC heparin  IV. Plan to change to po aspirin  and Plavix  as patient cannot afford Brilinta ) and had bleeding on it  OBJECTIVE CBC    Component Value Date/Time   WBC 9.1 04/15/2023 0514   RBC 2.81 (L) 04/15/2023 0514   HGB 8.1 (L) 04/15/2023 0514   HGB 15.9 07/27/2011 1426   HCT 24.3 (L) 04/15/2023 0514   HCT 54.3 (H) 03/06/2023 1449   PLT 288 04/15/2023 0514   PLT 240 07/27/2011 1426   MCV 86.5 04/15/2023 0514   MCV 88 07/27/2011 1426   MCH 28.8 04/15/2023 0514   MCHC 33.3 04/15/2023 0514   RDW 16.2 (H) 04/15/2023 0514   RDW 13.5 07/27/2011 1426   LYMPHSABS 2.6 04/15/2023 0514   MONOABS 0.8 04/15/2023 0514   EOSABS 0.4 04/15/2023 0514   BASOSABS 0.0 04/15/2023 0514   BMET    Component Value Date/Time   NA 140 04/15/2023  0514   NA 139 07/27/2011 1426   K 3.7 04/15/2023 0514   K 3.6 07/27/2011 1426   CL 106 04/15/2023 0514   CL 104 07/27/2011 1426   CO2 31 04/15/2023 0514   CO2 27 07/27/2011 1426   GLUCOSE 91 04/15/2023 0514   GLUCOSE 123 (H) 07/27/2011 1426   BUN 11 04/15/2023 0514   BUN 17 07/27/2011 1426   CREATININE 1.32 (H) 04/15/2023 0514   CREATININE 1.27 07/27/2011 1426   CALCIUM  8.2 (L) 04/15/2023 0514   CALCIUM  8.3 (L) 07/27/2011 1426   GFRNONAA >60 04/15/2023 0514   GFRNONAA >60 07/27/2011 1426   Lab Results  Component Value Date   HGBA1C 5.2 04/01/2023   Lab Results  Component Value Date   CHOL 148 04/02/2023   HDL 27 (L) 04/02/2023   LDLCALC 101 (H) 04/02/2023   TRIG 96 04/06/2023   CHOLHDL 5.5 04/02/2023   IMAGING past 24 hours No results found.  Vitals:   04/14/23 1600 04/14/23 1941 04/15/23 0132 04/15/23 0400  BP:  107/62 117/73 115/62  Pulse:  75 70 70  Resp:  16 (!) 23 (!) 23  Temp: 98 F (36.7 C) 98.2 F (36.8 C) 98.1 F (36.7 C) 98 F (36.7 C)  TempSrc: Oral Oral Oral Oral  SpO2:  97% 100% 99%  Weight:      Height:       PHYSICAL EXAM General:  Alert, well-nourished, well-developed patient in no acute distress laying in ER stretcher Psych:  Mood and affect appropriate for situation, patient is calm and cooperative with exam CV: Regular rate and rhythm on bedside cardiac monitor Respiratory:  Regular, unlabored respirations on room air GI: Abdomen soft, rounded  NEURO:  Mental Status: AA&Ox3, patient is able to give clear and coherent history Speech/Language: speech is without dysarthria or aphasia.  Naming, repetition, fluency, and comprehension intact.  Cranial Nerves:  II: PERRL. Visual fields full.  III, IV, VI: EOMI. Eyelids elevate symmetrically.  V: Sensation is intact to light touch and symmetrical to face.  VII: Face is symmetrical resting and and with movement VIII: Hearing is intact to voice. IX, X: Palate elevates symmetrically. Phonation  is normal.  XI: Shoulder shrug 5/5. XII: Tongue is midline without fasciculations. Motor: 5/5 strength to all muscle groups tested.  Tone: is normal and bulk is normal Sensation: Intact to light touch bilaterally. Extinction absent to light touch to DSS.   Coordination: FTN intact bilaterally (initially there seemed to be some past pointing in the left upper extremity but with repeat examination, this improved), HKS: no ataxia in BLE.No drift.  Gait: Deferred  ASSESSMENT/PLAN  Intracerebral Hemorrhage:  acute intra-axial hemorrhage at the right cerebral peduncle, approximately 8 mm with mild surrounding edema Etiology:  Likely 2/2 DAPT from recent basilar stenting   CT Head (stability scan): Known 8 mm acute parenchymal hemorrhage at the right thalamic capsular junction/right cerebral peduncle.  Mild surrounding edema, unchanged.  Small chronic left occipital lobe cortical infarct. MRI : Acute small ICH at the right cerebral peduncle, approximately 8 mm.  Mild surrounding edema.  No significant mass effect.  No intraventricular or extra-axial extension identified.  Otherwise expected evolution of brainstem and left cerebellar infarcts since 04/01/2023.  No other acute intracranial abnormality.  Improved basilar artery flow void since 04/01/2023, evidence of continued occlusion distal left vertebral artery. MRA : Unchanged distal left vertebral artery occlusion.  Distal right vertebral artery remains patent.  Patent proximal basilar artery stent and improved distal basilar, bilateral SCA and PCA flow related enhancement since 04/01/2023.  Stable anterior circulation, mild to moderate bilateral supraclinoid ICA atherosclerotic stenosis.  2D Echo 04/02/23: LVEF 55 to 60%. 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 Dolphus  Near occlusion of the  right vertebral basilar junction just distal to the right posterior inferior cerebellar  artery with a string sign opacification of the proximal one third to half  of the basilar artery.  Distal one third of the basilar artery opacifies  retrogradely via a severely diseased left posterior communicating artery.   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  loaded with brilinta  12/19, discharged home on Brilinta  and ASA DAPT Stent assisted angioplasty of basilar artery performed 12/20 Now on aspirin  81 and heparin  IV.  Plan to continue heparin  IV tomorrow, if stable then on 1/1 switch to Brilinta  45 twice daily or Plavix  75 daily while checking P2 Y12 levels per Dr. Dolphus.  GIB Anemia  Likely due to brilinta  use recent hematemesis, melena FOBT +  HGB 15.5--12.1--8.6--8.0--7.6--7.4--7.7--7.5 Hold Brilinta  On liquid diet PPI GI on board, no procedure needed at this time  Hypertensive during recent admission (12/17-12/22 requiring cleviprex  and labetalol ) Hypotensive blood pressure during current admission, query possible sepsis Home meds:  None Stable on the low end BP goal normotensive  Hyperlipidemia Home meds:  Crestor  40 mg daily LDL 101, goal < 70 Crestor  resumed Continue statin at  discharge  Diabetes type II Controlled Non-insulin  dependent  Home meds:  None HgbA1c 5.2, goal < 7.0 CBGs SSI as needed Close PCP follow up for DM control  Other Stroke Risk Factors ETOH use, alcohol  level <10, advised to drink no more than 2 drink(s) a day Obesity, Body mass index is 34.22 kg/m., BMI >/= 30 associated with increased stroke risk, recommend weight loss, diet and exercise as appropriate   Other Active Problems leukocytosis WBC 17.0->10.6->10.3->8.6->10.2 Questionable UA with large leukocytes, small bacteria. Pending culture.  CXR with findings concerning for bronchitis  Blood cultures NGTD Respiratory panel unremarkable  Afebrile  CKD stage IIIa Cr 1.61--1.56--1.37--1.25--1.22--1.34 - encourage po intake  Hospital day # 4 I have personally obtained history,examined this patient, reviewed notes, independently viewed imaging studies, participated in medical decision making and plan of care.ROS completed by me personally and pertinent positives fully documented  I have made any additions or clarifications directly to the above note. Agree with note above.  Patient with recent intracranial stent presented with GI bleed and small midbrain hemorrhage but no acute stroke symptoms on aspirin  and Brilinta .  Patient has a fresh intracranial stent and is high risk for occlusion in the absence of dual antiplatelet therapy however he is also at risk for increasing his intracerebral and GI hemorrhage on dual antiplatelet therapy but risk-benefit may be inferior of resuming antiplatelet therapy at the present time.  Patient informs me that he cannot afford Brilinta  and recommend aspirin  and Plavix  instead.  Discussed with Dr.Grunz patient appears hemodynamically stable to resume antiplatelet therapy.  Discontinue IV heparin .  Long discussion with patient and friend at the bedside and answered questions about risk-benefit of resuming antiplatelet therapy versus stent occlusion if this  therapy is withheld..  Greater than 50% time during this 35-minute visit were spent on counseling and coordination of care and discussion patient and care team and answering questions.  Eather Popp, MD Medical Director [facility] Pager: [phone] 04/15/2023 2:15 PM   Eather Popp, MD Stroke Neurology 04/15/2023 2:14 PM     To contact Stroke Continuity provider, please refer to [email or web address]. After hours, contact General Neurology

## 2023-04-15 NOTE — Progress Notes (Signed)
 [facility] PROGRESS NOTE  Gavin Dorsey (DOB: 12-09-1960) FMW:[id number] PCP: Delaine Glendia Bring, MD  Brief Narrative: Gavin Dorsey is a 62 y.o. 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 Was found to be tachycardic, hypotensive which improved with IV fluids. He was afebrile though WBC 17k thought to represent sepsis from uncertain source, so was given vancomycin /cefepime /flagyl . Hemoglobin was 12.1 from 15.5 most recently and subsequently declined to 8.0g/dl consistent with ABLA with +FOBT, BUN of 71 (from 16) due to GI bleeding. GI was consulted, PPI and carafate  started, EGD deferred. Aspirin  was continued and brilinta  held in this setting.   Repeat neuroimaging revealed patent basilar artery stent, unchanged distal left vertebral artery occlusion, and new intra-axial hemorrhage with surrounding edema without mass effect for which neurology was consulted. Repeat CT head showed stability. Multidisciplinary decision was made to initiate anticoagulation with heparin  bolus and monitor very closely.  CT C/A/P revealed questionable pancreatic tail haziness, bronchitis without pneumonia, diverticulosis without diverticulitis among other findings.   Subjective: No BM since arrival, eating well, no abd pain. No emesis. No new neurological changes. Spoke with best friend who will be caretaker by speaker phone today at bedside.  Objective: BP 115/62 (BP Location: Right Arm)   Pulse 70   Temp 98 F (36.7 C) (Oral)   Resp (!) 23   Ht 5' 8 (1.727 m)   Wt 102.1 kg   SpO2 99%   BMI 34.22 kg/m   Gen: No distress Pulm: Clear, nonlabored  CV: RRR, no edema GI: Soft, NT, ND, +BS Neuro: Alert and oriented. No new focal deficits. Ext: Warm, no deformities. Skin: No rashes, lesions or ulcers on visualized skin    Assessment & Plan: Acute blood loss anemia due to upper GI bleed:  - Serial H/H has shown stability. Hgb 8.1g/dl today. Pt asymptomatic. Will continue monitoring in AM or more frequent if bleeding or vital sign changes develop.  - Keep T&S UTD (updated 12/30). Pt does consent to transfusion if hgb < 7g/dl or symptoms or active bleeding.  Upper GI bleeding: Suggested by melena, +FOBT, elevated BUN. Diverticulosis on CT without inflammation. Clinically shows evidence of having stopped bleeding. Hemodynamics are stable. BUN returned to normal - Appears we will be able to avoid inpatient EGD, GI consult appreciated, ok'ed unrestricted diet and signed off 12/29. - Continue IV PPI BID, carafate  QID (no Tx PTA).  - Clinically resolved.  Recent ischemic CVAs s/p basilar artery angioplasty/stent: Symptoms stable. - Continue aspirin  - Started heparin  gtt without bolus 12/28 to maintain stent patency. Very difficult situation. Per neurology, plan to continue heparin  gtt 4-5 days (4 days will be in the morning of 1/1) and if stable then switch to Brilinta  45 twice daily or Plavix  75 daily and checking P2 Y12 levels. - Pt and family report reluctance to discharge without observation period back on antiplatelet. Defer questions to neurology. - Neurochecks  Acute bronchitis: I don't suspect sepsis. No lactic acidosis, leukocytosis can be attributed to hemoconcentration, cultures are negative (will continue monitoring), tachycardia and hypotension responded briskly to IVF.  - Avoiding steroids in setting of GIB.  - Respiratory status is stable. No wheezing. No daytime hypoxia or symptoms of dyspnea. - Completing antibiotics today. RSV, covid-19, flu PCRs negative - He's being placed on supplemental oxygen nocturnally, suspect nocturnal hypoxemia possibly related to  undiagnosed sleep apnea. Suggest sleep study after discharge.   Chronic HFpEF: LVEF 55-60%, G1DD on recent echo. Compensated.  - Does not  appear to be on standing medications for this.    NIDT2DM: Well-controlled with HbA1c 5.2%.  - Continue SSI. No home meds noted.  HLD:  - Continue statin   AKI: Resolved.  - Avoid nephrotoxins.   4.1cm aortic root dilatation:  - Recommend annual imaging by CTA or MRA.   Bernardino KATHEE Come, MD [facility] [email or web address] 04/15/2023, 10:33 AM

## 2023-04-15 NOTE — Progress Notes (Signed)
 PHARMACY - ANTICOAGULATION CONSULT NOTE  Pharmacy Consult for heparin  Indication: s/p IR neuro revascularization, high risk stent occlusion   No Known Allergies  Patient Measurements: Height: 5' 8 (172.7 cm) Weight: 102.1 kg (225 lb 1.4 oz) IBW/kg (Calculated) : 68.4 Heparin  Dosing Weight: 90 kg  Vital Signs: Temp: 98 F (36.7 C) (12/31 0400) Temp Source: Oral (12/31 0400) BP: 115/62 (12/31 0400) Pulse Rate: 70 (12/31 0400)  Labs: Recent Labs    04/13/23 0251 04/13/23 1726 04/14/23 0439 04/15/23 0514  HGB 7.4* 7.7* 7.5* 8.1*  HCT 21.9* 23.3* 22.7* 24.3*  PLT 214  --  253 288  HEPARINUNFRC 0.11*  --  0.13* 0.17*  CREATININE 1.22  --  1.34* 1.32*    Estimated Creatinine Clearance: 67.2 mL/min (A) (by C-G formula based on SCr of 1.32 mg/dL (H)).   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 Patient was on Brilinta  and aspirin  secondary to recent CVA and presented with upper GI bleed with hematemesis and melena. Brilinta  held and ASA resumed on 12/27. Pharmacy consulted to start heparin  with no bolus and low goal.  Heparin  level continues to be therapeutic (therapeutic but at lower end of goal). CBC okay - hgb 7s and plts wnl. Likely back on DAPT in AM.   Goal of Therapy:  Heparin  level 0.1-0.25 units/ml Monitor platelets by anticoagulation protocol: Yes   Plan:  Continues heparin  950 units/hr Monitor daily heparin  level, CBC, signs/symptoms of bleeding  Follow-up plans to start Plavix  or Brilinta    Sergio Batch, PharmD, BCIDP, AAHIVP, CPP Infectious Disease Pharmacist 04/15/2023 11:07 AM

## 2023-04-15 NOTE — Plan of Care (Signed)
  Problem: Education: Goal: Knowledge of General Education information will improve Description: Including pain rating scale, medication(s)/side effects and non-pharmacologic comfort measures Outcome: Progressing   Problem: Health Behavior/Discharge Planning: Goal: Ability to manage health-related needs will improve Outcome: Progressing   Problem: Clinical Measurements: Goal: Will remain free from infection Outcome: Progressing Goal: Respiratory complications will improve Outcome: Progressing   Problem: Activity: Goal: Risk for activity intolerance will decrease Outcome: Progressing   Problem: Nutrition: Goal: Adequate nutrition will be maintained Outcome: Progressing   Problem: Coping: Goal: Level of anxiety will decrease Outcome: Progressing   Problem: Pain Management: Goal: General experience of comfort will improve Outcome: Progressing   Problem: Safety: Goal: Ability to remain free from injury will improve Outcome: Progressing

## 2023-04-15 NOTE — Progress Notes (Signed)
 Physical Therapy Treatment Patient Details Name: Gavin Dorsey MRN: [id number] DOB: 10-Jan-1961 Today's Date: 04/15/2023   History of Present Illness 62 y.o. male presents to [facility] hospital 12/26 with dark stools, generalized weakness and focal LUE weakness. Pt admitted with concern for GIB. Of note pt recently admitted 12/18 with brainstem and cerebellar peduncle infarcts, underwent stent placement for basilar artery. PMHx: DM II, HTN, CVA.    PT Comments  Pt tolerated treatment well today. Pt able to ambulate in hallway with RW and navigate stairs with CGA. No change in DC/DME recs at this time. PT will continue to follow.     If plan is discharge home, recommend the following: A little help with walking and/or transfers;A little help with bathing/dressing/bathroom;Assistance with cooking/housework;Assist for transportation;Help with stairs or ramp for entrance   Can travel by private vehicle        Equipment Recommendations  None recommended by PT    Recommendations for Other Services       Precautions / Restrictions Precautions Precautions: Fall Precaution Comments: high fall risk Restrictions Weight Bearing Restrictions Per Provider Order: No     Mobility  Bed Mobility Overal bed mobility: Modified Independent Bed Mobility: Supine to Sit, Sit to Supine     Supine to sit: Modified independent (Device/Increase time), HOB elevated Sit to supine: Modified independent (Device/Increase time)        Transfers Overall transfer level: Needs assistance Equipment used: Rolling walker (2 wheels) Transfers: Sit to/from Stand Sit to Stand: Supervision           General transfer comment: Cues for hand placement however pt still elected to pull up on RW.    Ambulation/Gait Ambulation/Gait assistance: Contact guard assist Gait Distance (Feet): 250 Feet Assistive device: Rolling walker (2 wheels) Gait Pattern/deviations: Step-through pattern, Decreased stride length, Trunk  flexed Gait velocity: decreased     General Gait Details: Pt overall steady with RW. no overt LOB noted. Pt states he will be using RW full time once DC.   Stairs Stairs: Yes Stairs assistance: Contact guard assist Stair Management: One rail Right, Step to pattern, Forwards Number of Stairs: 3 General stair comments: VSS, pt stable with at least single UE support   Wheelchair Mobility     Tilt Bed    Modified Rankin (Stroke Patients Only)       Balance Overall balance assessment: Needs assistance Sitting-balance support: No upper extremity supported, Feet supported Sitting balance-Leahy Scale: Good     Standing balance support: No upper extremity supported, During functional activity Standing balance-Leahy Scale: Poor Standing balance comment: Reliant on RW                            Cognition Arousal: Alert Behavior During Therapy: WFL for tasks assessed/performed Overall Cognitive Status: Within Functional Limits for tasks assessed                                          Exercises      General Comments General comments (skin integrity, edema, etc.): VSS      Pertinent Vitals/Pain Pain Assessment Pain Assessment: No/denies pain    Home Living                          Prior Function  PT Goals (current goals can now be found in the care plan section) Progress towards PT goals: Progressing toward goals    Frequency    Min 1X/week      PT Plan      Co-evaluation              AM-PAC PT 6 Clicks Mobility   Outcome Measure  Help needed turning from your back to your side while in a flat bed without using bedrails?: None Help needed moving from lying on your back to sitting on the side of a flat bed without using bedrails?: None Help needed moving to and from a bed to a chair (including a wheelchair)?: A Little Help needed standing up from a chair using your arms (e.g., wheelchair or  bedside chair)?: A Little Help needed to walk in hospital room?: A Little Help needed climbing 3-5 steps with a railing? : A Little 6 Click Score: 20    End of Session Equipment Utilized During Treatment: Gait belt Activity Tolerance: Patient tolerated treatment well Patient left: in bed;with call bell/phone within reach;with bed alarm set Nurse Communication: Mobility status PT Visit Diagnosis: Other abnormalities of gait and mobility (R26.89);Other symptoms and signs involving the nervous system (R29.898)     Time: 1205-1222 PT Time Calculation (min) (ACUTE ONLY): 17 min  Charges:    $Gait Training: 8-22 mins PT General Charges $$ ACUTE PT VISIT: 1 Visit                     Aerik Polan B, PT, DPT Acute Rehab Services [phone]    Mirta Mally 04/15/2023, 4:31 PM

## 2023-04-15 NOTE — Plan of Care (Signed)

## 2023-04-16 DIAGNOSIS — I6529 Occlusion and stenosis of unspecified carotid artery: Secondary | ICD-10-CM | POA: Diagnosis not present

## 2023-04-16 DIAGNOSIS — K920 Hematemesis: Secondary | ICD-10-CM | POA: Diagnosis not present

## 2023-04-16 LAB — CBC WITH DIFFERENTIAL/PLATELET
Abs Immature Granulocytes: 0.07 10*3/uL (ref 0.00–0.07)
Basophils Absolute: 0 10*3/uL (ref 0.0–0.1)
Basophils Relative: 0 %
Eosinophils Absolute: 0.4 10*3/uL (ref 0.0–0.5)
Eosinophils Relative: 5 %
HCT: 23.6 % — ABNORMAL LOW (ref 39.0–52.0)
Hemoglobin: 7.9 g/dL — ABNORMAL LOW (ref 13.0–17.0)
Immature Granulocytes: 1 %
Lymphocytes Relative: 25 %
Lymphs Abs: 2.3 10*3/uL (ref 0.7–4.0)
MCH: 28.2 pg (ref 26.0–34.0)
MCHC: 33.5 g/dL (ref 30.0–36.0)
MCV: 84.3 fL (ref 80.0–100.0)
Monocytes Absolute: 0.8 10*3/uL (ref 0.1–1.0)
Monocytes Relative: 8 %
Neutro Abs: 5.6 10*3/uL (ref 1.7–7.7)
Neutrophils Relative %: 61 %
Platelets: 330 10*3/uL (ref 150–400)
RBC: 2.8 MIL/uL — ABNORMAL LOW (ref 4.22–5.81)
RDW: 16.3 % — ABNORMAL HIGH (ref 11.5–15.5)
WBC: 9.2 10*3/uL (ref 4.0–10.5)
nRBC: 0 % (ref 0.0–0.2)

## 2023-04-16 LAB — HEPARIN LEVEL (UNFRACTIONATED): Heparin Unfractionated: <0.1 [IU]/mL — ABNORMAL LOW (ref 0.30–0.70)

## 2023-04-16 LAB — BASIC METABOLIC PANEL
Anion gap: 9 (ref 5–15)
BUN: 14 mg/dL (ref 8–23)
CO2: 26 mmol/L (ref 22–32)
Calcium: 8.1 mg/dL — ABNORMAL LOW (ref 8.9–10.3)
Chloride: 104 mmol/L (ref 98–111)
Creatinine, Ser: 1.32 mg/dL — ABNORMAL HIGH (ref 0.61–1.24)
GFR, Estimated: >60 mL/min (ref >60)
Glucose, Bld: 84 mg/dL (ref 70–99)
Potassium: 3.3 mmol/L — ABNORMAL LOW (ref 3.5–5.1)
Sodium: 139 mmol/L (ref 135–145)

## 2023-04-16 LAB — PHOSPHORUS: Phosphorus: 3.2 mg/dL (ref 2.5–4.6)

## 2023-04-16 LAB — CULTURE, BLOOD (SINGLE): Culture: NO GROWTH

## 2023-04-16 LAB — MAGNESIUM: Magnesium: 1.9 mg/dL (ref 1.7–2.4)

## 2023-04-16 MED ORDER — POTASSIUM CHLORIDE CRYS ER 20 MEQ PO TBCR
40.0000 meq | EXTENDED_RELEASE_TABLET | Freq: Once | ORAL | Status: AC
  Administered 2023-04-16: 40 meq via ORAL
  Filled 2023-04-16: qty 2

## 2023-04-16 MED ORDER — PANTOPRAZOLE SODIUM 40 MG PO TBEC: 40.0000 mg | DELAYED_RELEASE_TABLET | Freq: Two times a day (BID) | ORAL | 2 refills | Status: DC

## 2023-04-16 MED ORDER — CLOPIDOGREL BISULFATE 75 MG PO TABS: 75.0000 mg | ORAL_TABLET | Freq: Every day | ORAL | 2 refills | Status: DC

## 2023-04-16 MED ORDER — ROSUVASTATIN CALCIUM 40 MG PO TABS: 40.0000 mg | ORAL_TABLET | Freq: Every day | ORAL | 2 refills | Status: DC

## 2023-04-16 MED ORDER — SUCRALFATE 1 GM/10ML PO SUSP: 1.0000 g | Freq: Three times a day (TID) | ORAL | 0 refills | Status: DC

## 2023-04-16 MED ORDER — ASPIRIN 81 MG PO CHEW: 81.0000 mg | CHEWABLE_TABLET | Freq: Every day | ORAL | 2 refills | Status: DC

## 2023-04-16 NOTE — Progress Notes (Signed)
 STROKE TEAM PROGRESS NOTE   BRIEF HPI [name] is a 63 y.o. male with history of HTN, HLD, DM2, recent acute and subacute brainstem and left cerebellar hemisphere infarctions 04/01/23 s/p basilar angioplasty and stenting with discharge on 12/22 on aspirin  and Brilinta , statin presenting with dark stools, recent black bilious emesis, and left arm weakness. Initial evaluation reveals a + FOBT and neuroimaging revealed an acute intra-axial hemorrhage at the right cerebral peduncle with mild surrounding edema and no significant mass effect and was admitted for further evaluation.   SIGNIFICANT HOSPITAL EVENTS 12/27 - Admission for +FOBT and ICH - MRI brain 05:00 acute intra-axial hemorrhage at the right cerebral peduncle approximately 8 mm with mild surrounding edema and no significant mass effect - CT head stability scan 08:40 with stable ICH - Patient reports no vomiting in 12 hours, Hgb 15.5 on previous discharge, 12.1 on admission  - 12/28 on liquid diet, and heparin  IV started on top of ASA 81  INTERIM HISTORY/SUBJECTIVE Patient is sitting up in bed.  He is keen to go home.  Neuro stable, intact.  Hb stable but low at 7.9 but no over bleeding. No active GI bleeding.   Continue po aspirin  and Plavix  as patient cannot afford Brilinta ) and had bleeding on it  OBJECTIVE CBC    Component Value Date/Time   WBC 9.2 04/16/2023 0456   RBC 2.80 (L) 04/16/2023 0456   HGB 7.9 (L) 04/16/2023 0456   HGB 15.9 07/27/2011 1426   HCT 23.6 (L) 04/16/2023 0456   HCT 54.3 (H) 03/06/2023 1449   PLT 330 04/16/2023 0456   PLT 240 07/27/2011 1426   MCV 84.3 04/16/2023 0456   MCV 88 07/27/2011 1426   MCH 28.2 04/16/2023 0456   MCHC 33.5 04/16/2023 0456   RDW 16.3 (H) 04/16/2023 0456   RDW 13.5 07/27/2011 1426   LYMPHSABS 2.3 04/16/2023 0456   MONOABS 0.8 04/16/2023 0456   EOSABS 0.4 04/16/2023 0456   BASOSABS 0.0 04/16/2023 0456   BMET    Component Value Date/Time   NA 139 04/16/2023 0456    NA 139 07/27/2011 1426   K 3.3 (L) 04/16/2023 0456   K 3.6 07/27/2011 1426   CL 104 04/16/2023 0456   CL 104 07/27/2011 1426   CO2 26 04/16/2023 0456   CO2 27 07/27/2011 1426   GLUCOSE 84 04/16/2023 0456   GLUCOSE 123 (H) 07/27/2011 1426   BUN 14 04/16/2023 0456   BUN 17 07/27/2011 1426   CREATININE 1.32 (H) 04/16/2023 0456   CREATININE 1.27 07/27/2011 1426   CALCIUM  8.1 (L) 04/16/2023 0456   CALCIUM  8.3 (L) 07/27/2011 1426   GFRNONAA >60 04/16/2023 0456   GFRNONAA >60 07/27/2011 1426   Lab Results  Component Value Date   HGBA1C 5.2 04/01/2023   Lab Results  Component Value Date   CHOL 148 04/02/2023   HDL 27 (L) 04/02/2023   LDLCALC 101 (H) 04/02/2023   TRIG 96 04/06/2023   CHOLHDL 5.5 04/02/2023   IMAGING past 24 hours No results found.  Vitals:   04/16/23 0048 04/16/23 0049 04/16/23 0349 04/16/23 0824  BP: 102/66  109/66 (!) 102/56  Pulse:  69 81 74  Resp:   17   Temp: 98.1 F (36.7 C)  98.3 F (36.8 C) 98.4 F (36.9 C)  TempSrc: Oral  Oral Oral  SpO2:  95% 96% 94%  Weight:      Height:       PHYSICAL EXAM General:  Alert,  well-nourished, well-developed patient in no acute distress laying in ER stretcher Psych:  Mood and affect appropriate for situation, patient is calm and cooperative with exam CV: Regular rate and rhythm on bedside cardiac monitor Respiratory:  Regular, unlabored respirations on room air GI: Abdomen soft, rounded  NEURO:  Mental Status: AA&Ox3, patient is able to give clear and coherent history Speech/Language: speech is without dysarthria or aphasia.  Naming, repetition, fluency, and comprehension intact.  Cranial Nerves:  II: PERRL. Visual fields full.  III, IV, VI: EOMI. Eyelids elevate symmetrically.  V: Sensation is intact to light touch and symmetrical to face.  VII: Face is symmetrical resting and and with movement VIII: Hearing is intact to voice. IX, X: Palate elevates symmetrically. Phonation is normal.  XI: Shoulder  shrug 5/5. XII: Tongue is midline without fasciculations. Motor: 5/5 strength to all muscle groups tested.  Tone: is normal and bulk is normal Sensation: Intact to light touch bilaterally. Extinction absent to light touch to DSS.   Coordination: FTN intact bilaterally (initially there seemed to be some past pointing in the left upper extremity but with repeat examination, this improved), HKS: no ataxia in BLE.No drift.  Gait: Deferred  ASSESSMENT/PLAN  Intracerebral Hemorrhage:  acute intra-axial hemorrhage at the right cerebral peduncle, approximately 8 mm with mild surrounding edema Etiology:  Likely 2/2 DAPT from recent basilar stenting   CT Head (stability scan): Known 8 mm acute parenchymal hemorrhage at the right thalamic capsular junction/right cerebral peduncle.  Mild surrounding edema, unchanged.  Small chronic left occipital lobe cortical infarct. MRI : Acute small ICH at the right cerebral peduncle, approximately 8 mm.  Mild surrounding edema.  No significant mass effect.  No intraventricular or extra-axial extension identified.  Otherwise expected evolution of brainstem and left cerebellar infarcts since 04/01/2023.  No other acute intracranial abnormality.  Improved basilar artery flow void since 04/01/2023, evidence of continued occlusion distal left vertebral artery. MRA : Unchanged distal left vertebral artery occlusion.  Distal right vertebral artery remains patent.  Patent proximal basilar artery stent and improved distal basilar, bilateral SCA and PCA flow related enhancement since 04/01/2023.  Stable anterior circulation, mild to moderate bilateral supraclinoid ICA atherosclerotic stenosis.  2D Echo 04/02/23: LVEF 55 to 60%. 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 Dolphus  Near occlusion of the  right vertebral basilar junction just distal to the right posterior inferior cerebellar  artery with a string sign opacification of the proximal one third to half  of the basilar artery.  Distal one third of the basilar artery opacifies  retrogradely via a severely diseased left posterior communicating artery.   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 loaded  with brilinta  12/19, discharged home on Brilinta  and ASA DAPT Stent assisted angioplasty of basilar artery performed 12/20 Now on aspirin  81 and heparin  IV.  Plan to continue heparin  IV tomorrow, if stable then on 1/1 switch to Brilinta  45 twice daily or Plavix  75 daily while checking P2 Y12 levels per Dr. Dolphus.  GIB Anemia  Likely due to brilinta  use recent hematemesis, melena FOBT +  HGB 15.5--12.1--8.6--8.0--7.6--7.4--7.7--7.5 Hold Brilinta  On liquid diet PPI GI on board, no procedure needed at this time  Hypertensive during recent admission (12/17-12/22 requiring cleviprex  and labetalol ) Hypotensive blood pressure during current admission, query possible sepsis Home meds:  None Stable on the low end BP goal normotensive  Hyperlipidemia Home meds:  Crestor  40 mg daily LDL 101, goal < 70 Crestor  resumed Continue statin at discharge  Diabetes type II  Controlled Non-insulin  dependent  Home meds:  None HgbA1c 5.2, goal < 7.0 CBGs SSI as needed Close PCP follow up for DM control  Other Stroke Risk Factors ETOH use, alcohol  level <10, advised to drink no more than 2 drink(s) a day Obesity, Body mass index is 34.22 kg/m., BMI >/= 30 associated with increased stroke risk, recommend weight loss, diet and exercise as appropriate   Other Active Problems leukocytosis WBC 17.0->10.6->10.3->8.6->10.2 Questionable UA with large leukocytes, small bacteria. Pending culture.  CXR with findings concerning for bronchitis  Blood cultures NGTD Respiratory panel unremarkable  Afebrile  CKD stage IIIa Cr 1.61--1.56--1.37--1.25--1.22--1.34 - encourage po intake  Hospital day # 5  Patient with recent intracranial stent presented with GI bleed and small midbrain hemorrhage but no acute stroke symptoms on aspirin  and Brilinta .  Patient has a fresh intracranial stent and is high risk for occlusion in the absence of dual antiplatelet therapy however he is also at risk for increasing his intracerebral and GI hemorrhage on dual antiplatelet therapy but risk-benefit may be inferior of resuming antiplatelet therapy at the present time.  Patient informs me that he cannot afford Brilinta  and recommend aspirin  and Plavix  instead.  Discussed with Dr.Singh patient appears hemodynamically stable to resume  dual antiplatelet therapy of aspirin  81 mg and Plavix  75 mg daily.   Long discussion with patient and friend at the bedside and answered questions about risk-benefit of resuming antiplatelet therapy versus stent occlusion if this therapy is withheld..  Greater than 50% time during this 25-minute visit were spent on counseling and coordination of care and discussion patient and care team and answering questions.  Eather Popp, MD Medical Director [facility] Pager: [phone] 04/16/2023 11:15 AM   Eather Popp, MD Stroke Neurology 04/16/2023 11:15  AM     To contact Stroke Continuity provider, please refer to [email or web address]. After hours, contact General Neurology

## 2023-04-16 NOTE — Discharge Instructions (Signed)
  Follow with Primary MD Delaine Glendia Bring, MD in 7 days follow-up with your gastroenterologist within a month, follow-up with the recommended neurologist and interventional neurologist within 2 to 3 weeks.  Get your antiplatelet regimen readdressed by the neurologist and interventional neurologist next visit.  Get CBC, CMP, 2 view Chest X ray -  checked next visit with your primary MD    Activity: As tolerated with Full fall precautions use walker/cane & assistance as needed  Disposition Home    Diet: Heart Healthy   Special Instructions: If you have smoked or chewed Tobacco  in the last 2 yrs please stop smoking, stop any regular Alcohol   and or any Recreational drug use.  On your next visit with your primary care physician please Get Medicines reviewed and adjusted.  Please request your Prim.MD to go over all Hospital Tests and Procedure/Radiological results at the follow up, please get all Hospital records sent to your Prim MD by signing hospital release before you go home.  If you experience worsening of your admission symptoms, develop shortness of breath, life threatening emergency, suicidal or homicidal thoughts you must seek medical attention immediately by calling 911 or calling your MD immediately  if symptoms less severe.  You Must read complete instructions/literature along with all the possible adverse reactions/side effects for all the Medicines you take and that have been prescribed to you. Take any new Medicines after you have completely understood and accpet all the possible adverse reactions/side effects.   Do not drive when taking Pain medications.  Do not take more than prescribed Pain, Sleep and Anxiety Medications  Wear Seat belts while driving.

## 2023-04-16 NOTE — Discharge Summary (Signed)
 Gavin Dorsey FMW:[id number] DOB: 1960-06-15 DOA: 04/10/2023  PCP: Delaine Glendia Bring, MD  Admit date: 04/10/2023  Discharge date: 04/16/2023  Admitted From: Home   Disposition:  Home   Recommendations for Outpatient Follow-up:   Follow up with PCP in 1-2 weeks  PCP Please obtain BMP/CBC, 2 view CXR in 1week,  (see Discharge instructions)   PCP Please follow up on the following pending results:    Home Health: None   Equipment/Devices: None  Consultations: Neuro, IR, GI Discharge Condition: Stable    CODE STATUS: Full    Diet Recommendation: Heart Healthy     Chief Complaint  Patient presents with   Weakness     Brief history of present illness from the day of admission and additional interim summary    63 y.o. 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   Patient stated that he began taking Brilinta  at time of the discharge 2 days ago. During my evaluation at the bedside patient reported that he has 3-4 episodes of loose stool in last 24 hours and also noticed black-colored stool for 3 to episodes as well.     Further workup suggested upper GI bleed, acute blood loss related anemia, sepsis acute bronchitis and sepsis and he was admitted to the hospital.                                                                 Hospital Course   Acute blood loss anemia due to upper  GI bleed:  - Serial H/H has shown stability. Hgb 8.1g/dl today. Pt asymptomatic.  Supportive care upper GI bleed has resolved, this likely happened due to him being placed on DAPT which was required after his recent stroke and stent placement.   Upper GI bleeding: Suggested by melena, +FOBT, elevated BUN. Diverticulosis on CT without inflammation. Clinically shows evidence of having stopped bleeding. Hemodynamics are stable. BUN returned to normal - Appears we will be able to avoid inpatient EGD, GI consult appreciated, ok'ed unrestricted diet and signed off 12/29. - Continue  PPI BID for 2 months, carafate  for another few weeks.  Follow-up with GI within a month.     Acute and subacute brainstem and left cerebellar hemisphere infarctions 04/01/23 s/p basilar angioplasty and stenting with discharge on 04/06/23 on aspirin  and Brilinta  : Symptoms stable. - Continue aspirin  -He was started and kept on heparin  gtt without bolus 12/28 to maintain stent patency.  Patient had a  long discussion with neurologist and stroke team on 04/15/2023 to which it was decided that patient will be discharged on aspirin  and Plavix  along with statin.  He could not afford Brilinta , he postdischarge she requested to follow-up with neurologist and IR within 2 weeks for further clarification of his antiplatelet regimen course and duration.   Acute bronchitis: Pleated treatment for it symptoms resolved. Suggest sleep study after discharge.  Suspicion for possible underlying undiagnosed sleep apnea.   Chronic HFpEF: LVEF 55-60%, G1DD on recent echo. Compensated.  - Does not appear to be on standing medications for this.    NIDT2DM: Well-controlled with HbA1c 5.2%.  -Diet controlled, PCP to monitor   HLD:  - Continue statin, counseled on compliance.   AKI: Resolved.  - Avoid nephrotoxins.    4.1cm aortic root dilatation:  - Recommend annual imaging by CTA or MRA.  PCP to monitor closely in the outpatient setting  postdischarge.  Hypokalemia.  Replaced.   Discharge diagnosis     Principal Problem:   GI bleed Active Problems:   History of stroke in prior 3 months   Sepsis (HCC)   Acute cystitis   Chronic diastolic CHF (congestive heart failure) (HCC)   Diarrhea   Essential hypertension   Basilar artery stenosis s/p stent placement   Chronic kidney disease (CKD), stage II (mild)   Non-insulin  dependent type 2 diabetes mellitus (HCC)   Hyperlipidemia   Nontraumatic intracranial hemorrhage (HCC)   Adverse effect of platelet aggregation inhibitor   Intracranial carotid stenosis    Discharge instructions    Discharge Instructions     Diet - low sodium heart healthy   Complete by: As directed    Discharge instructions   Complete by: As directed    Follow with Primary MD Delaine Glendia Bring, MD in 7 days follow-up with your gastroenterologist within a month, follow-up with the recommended neurologist and interventional neurologist within 2 to 3 weeks.  Get your antiplatelet regimen readdressed by the neurologist and interventional neurologist next visit.  Get CBC, CMP, 2 view Chest X ray -  checked next visit with your primary MD    Activity: As tolerated with Full fall precautions use walker/cane & assistance as needed  Disposition Home    Diet: Heart Healthy   Special Instructions: If you have smoked or chewed Tobacco  in the last 2 yrs please stop smoking, stop any regular Alcohol   and or any Recreational drug use.  On your next visit with your primary care physician please Get Medicines reviewed and adjusted.  Please request your Prim.MD to go over all Hospital Tests and Procedure/Radiological results at the follow up, please get all Hospital records sent to your Prim MD by signing hospital release before you go home.  If you experience worsening of your admission symptoms, develop shortness of breath, life threatening emergency, suicidal or homicidal thoughts you must seek  medical attention immediately by calling 911 or calling your MD immediately  if symptoms less severe.  You Must read complete instructions/literature along with all the possible adverse reactions/side effects for all the Medicines you take and that have been prescribed to you. Take any new Medicines after you have completely understood and accpet all the possible adverse reactions/side effects.   Do not drive when taking Pain medications.  Do not take more than prescribed Pain, Sleep and Anxiety Medications  Wear Seat belts while driving.   Increase activity slowly   Complete by: As directed  Discharge Medications   Allergies as of 04/16/2023   No Known Allergies      Medication List     STOP taking these medications    Brilinta  90 MG Tabs tablet Generic drug: ticagrelor    ticagrelor  90 MG Tabs tablet Commonly known as: BRILINTA        TAKE these medications    AMBULATORY NON FORMULARY MEDICATION Testosterone  Cream 20%  Apply 0.5cc daily  Quantity: 30ml Refill 5  [facility] Dr. Jewell FILLERS [phone]   aspirin  81 MG chewable tablet Chew 1 tablet (81 mg total) by mouth daily.   clopidogrel  75 MG tablet Commonly known as: PLAVIX  Take 1 tablet (75 mg total) by mouth daily.   diazepam 5 MG tablet Commonly known as: VALIUM Take 5 mg by mouth 2 (two) times daily as needed for anxiety.   pantoprazole  40 MG tablet Commonly known as: Protonix  Take 1 tablet (40 mg total) by mouth 2 (two) times daily before a meal.   rosuvastatin  40 MG tablet Commonly known as: CRESTOR  Take 1 tablet (40 mg total) by mouth daily.   sucralfate  1 GM/10ML suspension Commonly known as: CARAFATE  Take 10 mLs (1 g total) by mouth 4 (four) times daily -  with meals and at bedtime.   tadalafil  5 MG tablet Commonly known as: CIALIS  Take 1 tablet (5 mg total) by mouth daily as needed for erectile dysfunction.          Follow-up Information     Delaine Glendia Bring, MD. Schedule an appointment as soon as possible for a visit in 1 week(s).   Specialty: Internal Medicine Contact information: [address] [place] [address] [phone]         [facility]. Schedule an appointment as soon as possible for a visit in 2 week(s).   Contact information: [address]     [address] [place]  [address] [phone]        Dolphus Carrion, MD. Schedule an appointment as soon as possible for a visit in 2 week(s).   Specialties: Interventional Radiology, Radiology Contact information: [address] [place] [address] [phone]         [facility]. Schedule an appointment as soon as possible for a visit in 1 month(s).   Specialty: Gastroenterology Contact information: [address] [place]  [address] [phone]                Major procedures and Radiology Reports - PLEASE review detailed and final reports thoroughly  -        CT HEAD WO CONTRAST ( ) Result Date: 04/11/2023 CLINICAL DATA:  Provided history: Weakness. EXAM: CT HEAD WITHOUT CONTRAST TECHNIQUE: Contiguous axial images were obtained from the base of the skull through the vertex without intravenous contrast. RADIATION DOSE REDUCTION: This exam was performed according to the departmental dose-optimization program which includes automated exposure control, adjustment of the mA and/or kV according to patient size and/or use of iterative reconstruction technique. COMPARISON:  Brain MRI 04/11/2023. Non-contrast head CT and CT angiogram head/neck 04/01/2023. FINDINGS: Brain: Mild generalized cerebral atrophy. Known 8 mm acute parenchymal hemorrhage at the right thalamocapsular junction/right cerebral peduncle. Mild surrounding edema, unchanged. Known acute/subacute infarcts within the brainstem, left middle cerebellar  peduncle and left cerebellar hemisphere, occult by CT and better appreciated on the brain MRI performed earlier today. Known small chronic cortical infarct within the left occipital lobe. Patchy and ill-defined hypoattenuation  within the cerebral white matter, nonspecific but compatible with mild chronic small vessel ischemic disease. No extra-axial fluid collection. No evidence of an intracranial mass. No midline shift. Vascular: No hyperdense vessel. Vascular stent within the basilar artery. Atherosclerotic calcifications. Skull: No calvarial fracture or aggressive osseous lesion. Sinuses/Orbits: No mass or acute finding within the imaged orbits. Minimal mucosal thickening scattered within the paranasal sinuses. IMPRESSION: 1. Known 8 mm acute parenchymal hemorrhage at the right thalamocapsular junction/right cerebral peduncle. Mild surrounding edema, unchanged. 2. Known acute/subacute infarcts within the brainstem, left middle cerebellar peduncle and left cerebellar hemisphere, occult by CT and better appreciated on the brain MRI performed earlier today. 3. 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 Electronically Signed   By: Rockey Childs D.O.   On: 04/11/2023 09:01   MR BRAIN WO CONTRAST Addendum Date: 04/11/2023 ADDENDUM REPORT: 04/11/2023 06:47 ADDENDUM: MRI and MRA findings were discussed by telephone with Dr. SUBRINA SUNDIL on 04/11/2023 9367 hours. Electronically Signed   By: VEAR Hurst M.D.   On: 04/11/2023 06:47   Result Date: 04/11/2023 CLINICAL DATA:  63 year old male with scattered brainstem and left cerebellar hemisphere infarcts earlier this month, left vertebral artery occlusion, severe basilar artery stenosis/pre-occlusion status post stent assisted Basilar angioplasty on 04/04/2023. Neurologic deficit. EXAM: MRI HEAD WITHOUT CONTRAST TECHNIQUE: Multiplanar, multiecho pulse sequences of the brain and surrounding  structures were obtained without intravenous contrast. COMPARISON:  Brain MRI 04/01/2023. Intracranial MRA today reported separately. FINDINGS: Brain: Expected evolution of scattered, patchy brainstem and left cerebellar hemisphere infarcts since 04/01/2023. New DWI susceptibility at the right cerebral peduncle with surrounding patchy T2 and FLAIR hyperintensity (series 7, image 17, series 8, image 45) compatible with intra-axial hemorrhage estimated at about 8 mm diameter. No significant mass effect. No other new diffusion abnormality when compared to 04/01/2023. No other intracranial hemorrhage identified. No IVH or ventriculomegaly. Stable other supratentorial gray and white matter signal, widely scattered nonspecific white matter T2 and FLAIR hyperintensity. An small area of left superior occipital pole encephalomalacia. No midline shift or intracranial mass effect. Basilar cisterns remain normal. Negative pituitary and cervicomedullary junction. Vascular: Continued absence of the distal left vertebral artery flow void. But improved basilar artery flow void since 04/01/2023. Other Major intracranial vascular flow voids are preserved. Skull and upper cervical spine: Stable, negative. Sinuses/Orbits: Stable, negative. Other: Mastoids remain well aerated. Grossly normal visible internal auditory structures. Stable visible scalp and face. IMPRESSION: 1. MRI evidence of acute intra-axial hemorrhage at the right cerebral peduncle, proximally 8 mm. Mild surrounding edema. No significant mass effect. No intraventricular or extra-axial extension identified. 2. Otherwise expected evolution of brainstem and left cerebellar infarcts since 04/01/2023. No other acute intracranial abnormality. 3. Improved basilar artery flow void since 04/01/2023, evidence of continued occlusion distal left vertebral artery. See MRA today reported separately. Electronically Signed: By: VEAR Hurst M.D. On: 04/11/2023 06:21   MR ANGIO HEAD WO  CONTRAST Result Date: 04/11/2023 CLINICAL DATA:  63 year old male with scattered brainstem and left cerebellar hemisphere infarcts earlier this month, left vertebral artery occlusion, severe basilar artery stenosis/pre-occlusion status post stent assisted Basilar angioplasty on 04/04/2023. Neurologic deficit. EXAM: MRA HEAD WITHOUT CONTRAST TECHNIQUE: Angiographic images of the Circle of Willis were acquired using MRA technique without intravenous contrast. COMPARISON:  Brain MRI today. Pretreatment CTA head and neck 04/01/2023. FINDINGS: Anterior circulation: Tortuous distal cervical ICAs and antegrade flow signal throughout both ICA siphons. Ophthalmic artery origins appear normal. Mild to  moderate narrowing of the supraclinoid ICAs appears stable. Patent carotid termini, MCA and ACA origins. Visible ACA and MCA branches appears stable. Posterior circulation: No distal left vertebral artery antegrade flow signal. Antegrade flow in the distal right vertebral artery appears stable to the vertebrobasilar junction. Right PICA origin is patent. New proximal basilar artery susceptibility artifact compatible with new vascular stent (series 3, image 37) with maintained antegrade flow in the distal basilar artery indicating stent patency. SCA and PCA origins are patent. Posterior communicating arteries are diminutive or absent. Bilateral PCA branches appear improved since 04/01/2023. Anatomic variants: None significant. Other: Subtle new susceptibility artifact at the right midbrain, cerebral peduncle. See MRI today reported separately. IMPRESSION: 1. Unchanged distal left vertebral artery occlusion. Distal right vertebral artery remains patent. Patent proximal Basilar artery stent and improved distal Basilar, bilateral SCA and PCA flow related enhancement since 03/31/2061. 2. Stable anterior circulation, mild to moderate bilateral supraclinoid ICA atherosclerotic stenosis. 3. Right cerebral peduncle hemorrhage on MRI  today is reported separately. Electronically Signed   By: VEAR Hurst M.D.   On: 04/11/2023 06:26   CT CHEST ABDOMEN PELVIS W CONTRAST Result Date: 04/11/2023 CLINICAL DATA:  Dark stools, hematemesis and generalized weakness left upper extremity. Being evaluated for sepsis. Checking for source. 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 EXAM: CT CHEST, ABDOMEN, AND PELVIS WITH CONTRAST TECHNIQUE: Multidetector CT imaging of the chest, abdomen and pelvis was performed following the standard protocol during bolus administration of intravenous contrast. RADIATION DOSE REDUCTION: This exam was performed according to the departmental dose-optimization program which includes automated exposure control, adjustment of the mA and/or kV according to patient size and/or use of iterative reconstruction technique. CONTRAST:  75mL OMNIPAQUE  IOHEXOL  350 MG/ML SOLN COMPARISON:  No recent chest x-ray. Comparison is made with the abdomen and pelvis CT with contrast 11/12/2021 and a chest CT with contrast 09/11/2006. FINDINGS: CT CHEST FINDINGS Cardiovascular: The cardiac size is normal. There is no pericardial effusion. The pulmonary arteries and veins are normal in caliber. The pulmonary arteries are centrally clear. Small amount of scattered aortic and great vessel atherosclerosis. There is no stenosis or dissection. The aortic root measures 4.0 cm at the sinuses of Valsalva on 6:107. The mid ascending segment 4.1 cm on 6:111. Annual follow-up imaging recommended. Mediastinum/Nodes: No enlarged mediastinal, hilar, or axillary lymph nodes. The lower poles of the thyroid gland, trachea, and esophagus demonstrate no significant findings. Lungs/Pleura: There is diffuse bronchial thickening. There is no consolidation, effusion, pneumothorax or visible pulmonary nodule. 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 No acute or  other significant osseous findings. Unremarkable chest wall. CT ABDOMEN PELVIS FINDINGS Hepatobiliary: No focal liver abnormality is seen. No calcified gallstones, gallbladder wall thickening, or biliary dilatation. Pancreas: Questionable haziness at the pancreatic tail versus artifact due to beam hardening from the patient's arms in the field. Correlate with serum lipase for possible significance. There is no pancreatic mass enhancement or ductal dilatation, no other focal abnormality. Spleen: No abnormality. Adrenals/Urinary Tract: Adrenal glands are unremarkable. Kidneys are normal, without renal calculi, focal lesion, or hydronephrosis. Bladder is unremarkable. Stomach/Bowel: There is fluid filling of the nondilated stomach. Normal wall thickness. The unopacified small bowel is normal caliber without mesenteric inflammatory change. The appendix is normal. There is scattered colonic diverticulosis without evidence of diverticulitis. Vascular/Lymphatic: Aortic atherosclerosis. No enlarged abdominal or pelvic lymph nodes. Reproductive: There is mild prostatomegaly, stable. Other: No abdominal  wall hernia or abnormality. No abdominopelvic ascites. There is no free hemorrhage or free air. Musculoskeletal: Facet hypertrophy and spondylosis lumbar spine. No acute or other significant osseous findings. Mild hip DJD. Moderate acquired spinal stenosis L4-5. IMPRESSION: 1. Questionable haziness at the pancreatic tail versus artifact due to beam hardening from the patient's arms in the field. Correlate with serum lipase for possible significance. 2. Bronchitis without evidence of pneumonia. 3. Aortic atherosclerosis. 4. 4.0 cm aortic root and 4.1 cm mid ascending segment. Recommend annual imaging followup by CTA or MRA. This recommendation follows 2010 ACCF/AHA/AATS/ACR/ASA/ SCA/SCAI/SIR/STS/SVM Guidelines for the Diagnosis and Management of Patients with Thoracic Aortic Disease. Circulation. 2010; 121: Z733-z630. Aortic  aneurysm NOS (ICD10-I71.9). 5. Diverticulosis without evidence of diverticulitis. 6. Mild prostatomegaly. 7. Moderate acquired spinal stenosis L4-5. Aortic Atherosclerosis (ICD10-I70.0). Electronically Signed   By: Francis Quam M.D.   On: 04/11/2023 01:47   IR Intra Cran Stent Result Date: 04/08/2023 CLINICAL DATA:  Symptomatic basilar artery occlusion on recent diagnostic arteriogram. EXAM: INTRACRANIAL STENT (INCL PTA) COMPARISON:  Recent diagnostic arteriogram of the head and neck. MEDICATIONS: Heparin  4000 units IV. No antibiotic was administered within 1 hour of the procedure. ANESTHESIA/SEDATION: General anesthesia. CONTRAST:  Omnipaque  300 approximately 75 mm. FLUOROSCOPY TIME:  Fluoroscopy Time: 42 minutes (1921 mGy). COMPLICATIONS: None immediate. TECHNIQUE: Informed written consent was obtained from the patient after a thorough discussion of the procedural risks, benefits and alternatives. All questions were addressed. Maximal Sterile Barrier Technique was utilized including caps, mask, sterile gowns, sterile gloves, sterile drape, hand hygiene and skin antiseptic. A timeout was performed prior to the initiation of the procedure. The right forearm to the wrist was prepped and draped in the usual sterile manner. The right radial artery was identified and its morphology documented in the radiology PACS system. A dorsal palmar anastomosis was verified to be present. Using ultrasound guidance, access into the right radial artery was obtained with a 6/7 French radial sheath over an 018 inch micro guidewire. The obturator, and the micro guidewire were removed. Good aspiration was obtained from the side port of the radial sheath. A cocktail of 2000 units of heparin , 2.5 mg of Versed , and 200 mcg of nitroglycerin  was then infused in diluted form without event. A right radial arteriogram was performed. Over an 035 inch Roadrunner guidewire, a combination of a 7 French 105 cm Rist sheath with a support Simmons  2 125 cm catheter was advanced to the right subclavian artery and selectively advanced to the mid right vertebral artery. The support catheter, and the guidewire were removed. Good aspiration obtained from the 7 French Rist sheath. A gentle control arteriogram performed through this demonstrated no evidence of spasms, dissections or of intraluminal filling defects. Over an 035 inch Roadrunner guidewire, a 115 cm 5 French Sofia intermediate catheter was then advanced to the distal vertebral artery. The guidewire was removed. Good aspiration obtained from the hub of the [facility] guide catheter. FINDINGS: A control arteriogram was then performed through the intermediate catheter redemonstrated patency of the right vertebrobasilar junction and the dominant right posterior-inferior cerebellar artery. A 50% narrowing of the origin of the right PICA was noted. Distal to this a near occlusive stenosis of the proximal basilar artery was noted with a string sign distal opacification of the basilar artery to the level of the anterior-inferior cerebellar arteries. More distally, the basilar artery remained occluded. Measurements of the basilar artery distal and proximal to the severe pre occlusive stenosis were then performed. In a coaxial  manner and with constant heparinized saline solution using biplane roadmap technique and constant fluoroscopic guidance, a 2 mm x 15 mm Gateway angioplasty balloon catheter which had been prepped with 50% contrast and 50% heparinized saline infusion was advanced over an 014 inch standard micro guidewire with a moderate J configuration to the distal right vertebrobasilar junction. Using a torque device, the micro guidewire was gently manipulated and advanced without difficulty through the near occlusive proximal basilar artery to the level of the anterior-inferior cerebellar arteries. The balloon angioplasty catheter was then advanced and positioned with the distal and proximal markers of the  balloon angioplasty microcatheter adequate distance from the site of the severe stenosis. Control inflation of the balloon catheter was then performed using micro inflation syringe device via micro tubing to just over 2 atmospheres where it was maintained for approximately 28 minutes. Thereafter, the balloon was deflated and retrieved proximally. A control arteriogram performed through the intermediate catheter now demonstrated brisk flow through the angioplastied site into the distal basilar artery with the opacification of the posterior cerebral arteries and the superior cerebellar arteries with no change in the anterior-inferior cerebellar arteries. The balloon was then advanced distal to the angioplastied segment and exchanged for a 014 inch 300 cm exchange micro guidewire with a moderate J configuration without difficulty. A control arteriogram performed through the intermediate catheter demonstrates safe positioning of the tip of the exchange micro guidewire in the left posterior cerebral artery. At this time, a 2 mm x 12 mm Onyx Frontier balloon mountable stent which had been prepped with 50% contrast and 50% heparinized saline infusion antegradely and heparinized saline retrogradely was advanced in a coaxial manner using the rapid exchange technique without difficulty. The proximal and distal markers were then aligned adequately across the angioplastied segment. This stent was then deployed by inflating the balloon to 6 atmospheres where it was maintained for approximately a minute and half. Thereafter, the balloon was deflated and retrieved proximally. A control arteriogram performed through the intermediate catheter demonstrated excellent flow through the stented segment with brisk perfusion of the posterior circulation. Dominant left anterior-inferior cerebellar artery posterior-inferior cerebellar artery complex was noted. The balloon was then retrieved and removed. Control arteriograms were then  performed at 10 20, and 30 minutes post deployment of the stent. These continued to demonstrate excellent flow through the stented segment into the posterior circulation. The exchange micro guidewire was retrieved and removed. A final control arteriogram performed through the 7 French sheath at the origin of the right vertebral artery continued to demonstrate opacified right vertebral artery extra cranially and intracranially, with the posterior circulation being widely patent. No evidence of intraluminal filling defects, or of occlusion was evident. The wrist guidewire was removed. A wrist band was then applied for hemostasis at the right radial puncture site. Distal right radial pulse was verified to be present. The patient was then extubated. Upon recovery, the patient denied any nausea, vomiting or headaches. His speech appeared fairly clear. Tongue was in the midline. He was then transferred to the PACU and then neuro ICU for overnight post revascularization management. The patient was given 3 mg of intra arterial Integrilin  to prevent platelet aggregation at the site of the stent placement. IMPRESSION: Status post revascularization of occluded basilar artery with stent assisted angioplasty achieving near complete caliber of the previously occluded basilar artery with initial balloon angioplasty followed by placement of a balloon mountable 2 mm x 12 mm Onyx Frontier stent. PLAN: Follow-up in the clinic 2 weeks  post discharge. Electronically Signed   By: Thyra Nash M.D.   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 EXAM: INTRACRANIAL STENT (INCL PTA) COMPARISON:  Recent diagnostic arteriogram of the head and neck. MEDICATIONS: Heparin  4000 units IV. No antibiotic was administered within 1 hour of the procedure. ANESTHESIA/SEDATION: General anesthesia. CONTRAST:   Omnipaque  300 approximately 75 mm. FLUOROSCOPY TIME:  Fluoroscopy Time: 42 minutes (1921 mGy). COMPLICATIONS: None immediate. TECHNIQUE: Informed written consent was obtained from the patient after a thorough discussion of the procedural risks, benefits and alternatives. All questions were addressed. Maximal Sterile Barrier Technique was utilized including caps, mask, sterile gowns, sterile gloves, sterile drape, hand hygiene and skin antiseptic. A timeout was performed prior to the initiation of the procedure. The right forearm to the wrist was prepped and draped in the usual sterile manner. The right radial artery was identified and its morphology documented in the radiology PACS system. A dorsal palmar anastomosis was verified to be present. Using ultrasound guidance, access into the right radial artery was obtained with a 6/7 French radial sheath over an 018 inch micro guidewire. The obturator, and the micro guidewire were removed. Good aspiration was obtained from the side port of the radial sheath. A cocktail of 2000 units of heparin , 2.5 mg of Versed , and 200 mcg of nitroglycerin  was then infused in diluted form without event. A right radial arteriogram was performed. Over an 035 inch Roadrunner guidewire, a combination of a 7 French 105 cm Rist sheath with a support Simmons 2 125 cm catheter was advanced to the right subclavian artery and selectively advanced to the mid right vertebral artery. The support catheter, and the guidewire were removed. Good aspiration obtained from the 7 French Rist sheath. A gentle control arteriogram performed through this demonstrated no evidence of spasms, dissections or of intraluminal filling defects. Over an 035 inch Roadrunner guidewire, a 115 cm 5 French Sofia intermediate catheter was then advanced to the distal vertebral artery. The guidewire was removed. Good aspiration obtained from the hub of the [facility] guide catheter. FINDINGS: A control arteriogram was then  performed through the intermediate catheter redemonstrated patency of the right vertebrobasilar junction and the dominant right posterior-inferior cerebellar artery. A 50% narrowing of the origin of the right PICA was noted. Distal to this a near occlusive stenosis of the proximal basilar artery was noted with a string sign distal opacification of the basilar artery to the level of the anterior-inferior cerebellar arteries. More distally, the basilar artery remained occluded. Measurements of the basilar artery distal and proximal to the severe pre occlusive stenosis were then performed. In a coaxial manner and with constant heparinized saline solution using biplane roadmap technique and constant fluoroscopic guidance, a 2 mm x 15 mm Gateway angioplasty balloon catheter which had been prepped with 50% contrast and 50% heparinized saline infusion was advanced over an 014 inch standard micro guidewire with a moderate J configuration to the distal right vertebrobasilar junction. Using a torque device, the micro guidewire was gently manipulated and advanced without difficulty through the near occlusive proximal basilar artery to the level of the anterior-inferior cerebellar arteries. The balloon angioplasty catheter was then advanced and positioned with the distal and proximal markers of the balloon angioplasty microcatheter adequate distance from the site of the severe stenosis. Control inflation of the balloon catheter was then performed using micro inflation syringe device via  micro tubing to just over 2 atmospheres where it was maintained for approximately 28 minutes. Thereafter, the balloon was deflated and retrieved proximally. A control arteriogram performed through the intermediate catheter now demonstrated brisk flow through the angioplastied site into the distal basilar artery with the opacification of the posterior cerebral arteries and the superior cerebellar arteries with no change in the anterior-inferior  cerebellar arteries. The balloon was then advanced distal to the angioplastied segment and exchanged for a 014 inch 300 cm exchange micro guidewire with a moderate J configuration without difficulty. A control arteriogram performed through the intermediate catheter demonstrates safe positioning of the tip of the exchange micro guidewire in the left posterior cerebral artery. At this time, a 2 mm x 12 mm Onyx Frontier balloon mountable stent which had been prepped with 50% contrast and 50% heparinized saline infusion antegradely and heparinized saline retrogradely was advanced in a coaxial manner using the rapid exchange technique without difficulty. The proximal and distal markers were then aligned adequately across the angioplastied segment. This stent was then deployed by inflating the balloon to 6 atmospheres where it was maintained for approximately a minute and half. Thereafter, the balloon was deflated and retrieved proximally. A control arteriogram performed through the intermediate catheter demonstrated excellent flow through the stented segment with brisk perfusion of the posterior circulation. Dominant left anterior-inferior cerebellar artery posterior-inferior cerebellar artery complex was noted. The balloon was then retrieved and removed. Control arteriograms were then performed at 10 20, and 30 minutes post deployment of the stent. These continued to demonstrate excellent flow through the stented segment into the posterior circulation. The exchange micro guidewire was retrieved and removed. A final control arteriogram performed through the 7 French sheath at the origin of the right vertebral artery continued to demonstrate opacified right vertebral artery extra cranially and intracranially, with the posterior circulation being widely patent. No evidence of intraluminal filling defects, or of occlusion was evident. The wrist guidewire was removed. A wrist band was then applied for hemostasis at the right  radial puncture site. Distal right radial pulse was verified to be present. The patient was then extubated. Upon recovery, the patient denied any nausea, vomiting or headaches. His speech appeared fairly clear. Tongue was in the midline. He was then transferred to the PACU and then neuro ICU for overnight post revascularization management. The patient was given 3 mg of intra arterial Integrilin  to prevent platelet aggregation at the site of the stent placement. IMPRESSION: Status post revascularization of occluded basilar artery with stent assisted angioplasty achieving near complete caliber of the previously occluded basilar artery with initial balloon angioplasty followed by placement of a balloon mountable 2 mm x 12 mm Onyx Frontier stent. PLAN: Follow-up in the clinic 2 weeks post discharge. Electronically Signed   By: Thyra Nash M.D.   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 EXAM: INTRACRANIAL STENT (INCL PTA) COMPARISON:  Recent diagnostic arteriogram of the head and neck. MEDICATIONS: Heparin  4000 units IV. No antibiotic was administered within 1 hour of the procedure. ANESTHESIA/SEDATION: General anesthesia. CONTRAST:  Omnipaque  300 approximately 75 mm. FLUOROSCOPY TIME:  Fluoroscopy Time: 42 minutes (1921 mGy). COMPLICATIONS: None immediate. TECHNIQUE: Informed written consent was obtained from the patient after a thorough discussion of the procedural risks, benefits and alternatives. All questions were addressed. Maximal Sterile Barrier Technique was utilized including caps, mask, sterile gowns, sterile gloves, sterile drape, hand  hygiene and skin antiseptic. A timeout was performed prior to the initiation of the procedure. The right forearm to the wrist was prepped and draped in the usual sterile manner. The right radial artery was identified and its morphology documented  in the radiology PACS system. A dorsal palmar anastomosis was verified to be present. Using ultrasound guidance, access into the right radial artery was obtained with a 6/7 French radial sheath over an 018 inch micro guidewire. The obturator, and the micro guidewire were removed. Good aspiration was obtained from the side port of the radial sheath. A cocktail of 2000 units of heparin , 2.5 mg of Versed , and 200 mcg of nitroglycerin  was then infused in diluted form without event. A right radial arteriogram was performed. Over an 035 inch Roadrunner guidewire, a combination of a 7 French 105 cm Rist sheath with a support Simmons 2 125 cm catheter was advanced to the right subclavian artery and selectively advanced to the mid right vertebral artery. The support catheter, and the guidewire were removed. Good aspiration obtained from the 7 French Rist sheath. A gentle control arteriogram performed through this demonstrated no evidence of spasms, dissections or of intraluminal filling defects. Over an 035 inch Roadrunner guidewire, a 115 cm 5 French Sofia intermediate catheter was then advanced to the distal vertebral artery. The guidewire was removed. Good aspiration obtained from the hub of the [facility] guide catheter. FINDINGS: A control arteriogram was then performed through the intermediate catheter redemonstrated patency of the right vertebrobasilar junction and the dominant right posterior-inferior cerebellar artery. A 50% narrowing of the origin of the right PICA was noted. Distal to this a near occlusive stenosis of the proximal basilar artery was noted with a string sign distal opacification of the basilar artery to the level of the anterior-inferior cerebellar arteries. More distally, the basilar artery remained occluded. Measurements of the basilar artery distal and proximal to the severe pre occlusive stenosis were then performed. In a coaxial manner and with constant heparinized saline solution using biplane  roadmap technique and constant fluoroscopic guidance, a 2 mm x 15 mm Gateway angioplasty balloon catheter which had been prepped with 50% contrast and 50% heparinized saline infusion was advanced over an 014 inch standard micro guidewire with a moderate J configuration to the distal right vertebrobasilar junction. Using a torque device, the micro guidewire was gently manipulated and advanced without difficulty through the near occlusive proximal basilar artery to the level of the anterior-inferior cerebellar arteries. The balloon angioplasty catheter was then advanced and positioned with the distal and proximal markers of the balloon angioplasty microcatheter adequate distance from the site of the severe stenosis. Control inflation of the balloon catheter was then performed using micro inflation syringe device via micro tubing to just over 2 atmospheres where it was maintained for approximately 28 minutes. Thereafter, the balloon was deflated and retrieved proximally. A control arteriogram performed through the intermediate catheter now demonstrated brisk flow through the angioplastied site into the distal basilar artery with the opacification of the posterior cerebral arteries and the superior cerebellar arteries with no change in the anterior-inferior cerebellar arteries. The balloon was then advanced distal to the angioplastied segment and exchanged for a 014 inch 300 cm exchange micro guidewire with a moderate J configuration without difficulty. A control arteriogram performed through the intermediate catheter demonstrates safe positioning of the tip of the exchange micro guidewire in the left posterior cerebral artery. At this time, a 2 mm x 12 mm Onyx Frontier balloon mountable stent which  had been prepped with 50% contrast and 50% heparinized saline infusion antegradely and heparinized saline retrogradely was advanced in a coaxial manner using the rapid exchange technique without difficulty. The proximal and  distal markers were then aligned adequately across the angioplastied segment. This stent was then deployed by inflating the balloon to 6 atmospheres where it was maintained for approximately a minute and half. Thereafter, the balloon was deflated and retrieved proximally. A control arteriogram performed through the intermediate catheter demonstrated excellent flow through the stented segment with brisk perfusion of the posterior circulation. Dominant left anterior-inferior cerebellar artery posterior-inferior cerebellar artery complex was noted. The balloon was then retrieved and removed. Control arteriograms were then performed at 10 20, and 30 minutes post deployment of the stent. These continued to demonstrate excellent flow through the stented segment into the posterior circulation. The exchange micro guidewire was retrieved and removed. A final control arteriogram performed through the 7 French sheath at the origin of the right vertebral artery continued to demonstrate opacified right vertebral artery extra cranially and intracranially, with the posterior circulation being widely patent. No evidence of intraluminal filling defects, or of occlusion was evident. The wrist guidewire was removed. A wrist band was then applied for hemostasis at the right radial puncture site. Distal right radial pulse was verified to be present. The patient was then extubated. Upon recovery, the patient denied any nausea, vomiting or headaches. His speech appeared fairly clear. Tongue was in the midline. He was then transferred to the PACU and then neuro ICU for overnight post revascularization management. The patient was given 3 mg of intra arterial Integrilin  to prevent platelet aggregation at the site of the stent placement. IMPRESSION: Status post revascularization of occluded basilar artery with stent assisted angioplasty achieving near complete caliber of the previously occluded basilar artery with initial balloon angioplasty  followed by placement of a balloon mountable 2 mm x 12 mm Onyx Frontier stent. PLAN: Follow-up in the clinic 2 weeks post discharge. Electronically Signed   By: Thyra Nash M.D.   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 EXAM: INTRACRANIAL STENT (INCL PTA) COMPARISON:  Recent diagnostic arteriogram of the head and neck. MEDICATIONS: Heparin  4000 units IV. No antibiotic was administered within 1 hour of the procedure. ANESTHESIA/SEDATION: General anesthesia. CONTRAST:  Omnipaque  300 approximately 75 mm. FLUOROSCOPY TIME:  Fluoroscopy Time: 42 minutes (1921 mGy). COMPLICATIONS: None immediate. TECHNIQUE: Informed written consent was obtained from the patient after a thorough discussion of the procedural risks, benefits and alternatives. All questions were addressed. Maximal Sterile Barrier Technique was utilized including caps, mask, sterile gowns, sterile gloves, sterile drape, hand hygiene and skin antiseptic. A timeout was performed prior to the initiation of the procedure. The right forearm to the wrist was prepped and draped in the usual sterile manner. The right radial artery was identified and its morphology documented in the radiology PACS system. A dorsal palmar anastomosis was verified to be present. Using ultrasound guidance, access into the right radial artery was obtained with a 6/7 French radial sheath over an 018 inch micro guidewire. The obturator, and the micro guidewire were removed. Good aspiration was obtained from the side port of the radial sheath. A cocktail of 2000 units of heparin , 2.5 mg of Versed , and 200 mcg of nitroglycerin  was then infused in diluted form without event. A right radial arteriogram was performed. Over an 035 inch Roadrunner guidewire, a combination  of a 7 French 105 cm Rist sheath with a support Simmons 2 125 cm catheter was advanced to the right subclavian  artery and selectively advanced to the mid right vertebral artery. The support catheter, and the guidewire were removed. Good aspiration obtained from the 7 French Rist sheath. A gentle control arteriogram performed through this demonstrated no evidence of spasms, dissections or of intraluminal filling defects. Over an 035 inch Roadrunner guidewire, a 115 cm 5 French Sofia intermediate catheter was then advanced to the distal vertebral artery. The guidewire was removed. Good aspiration obtained from the hub of the [facility] guide catheter. FINDINGS: A control arteriogram was then performed through the intermediate catheter redemonstrated patency of the right vertebrobasilar junction and the dominant right posterior-inferior cerebellar artery. A 50% narrowing of the origin of the right PICA was noted. Distal to this a near occlusive stenosis of the proximal basilar artery was noted with a string sign distal opacification of the basilar artery to the level of the anterior-inferior cerebellar arteries. More distally, the basilar artery remained occluded. Measurements of the basilar artery distal and proximal to the severe pre occlusive stenosis were then performed. In a coaxial manner and with constant heparinized saline solution using biplane roadmap technique and constant fluoroscopic guidance, a 2 mm x 15 mm Gateway angioplasty balloon catheter which had been prepped with 50% contrast and 50% heparinized saline infusion was advanced over an 014 inch standard micro guidewire with a moderate J configuration to the distal right vertebrobasilar junction. Using a torque device, the micro guidewire was gently manipulated and advanced without difficulty through the near occlusive proximal basilar artery to the level of the anterior-inferior cerebellar arteries. The balloon angioplasty catheter was then advanced and positioned with the distal and proximal markers of the balloon angioplasty microcatheter adequate distance from  the site of the severe stenosis. Control inflation of the balloon catheter was then performed using micro inflation syringe device via micro tubing to just over 2 atmospheres where it was maintained for approximately 28 minutes. Thereafter, the balloon was deflated and retrieved proximally. A control arteriogram performed through the intermediate catheter now demonstrated brisk flow through the angioplastied site into the distal basilar artery with the opacification of the posterior cerebral arteries and the superior cerebellar arteries with no change in the anterior-inferior cerebellar arteries. The balloon was then advanced distal to the angioplastied segment and exchanged for a 014 inch 300 cm exchange micro guidewire with a moderate J configuration without difficulty. A control arteriogram performed through the intermediate catheter demonstrates safe positioning of the tip of the exchange micro guidewire in the left posterior cerebral artery. At this time, a 2 mm x 12 mm Onyx Frontier balloon mountable stent which had been prepped with 50% contrast and 50% heparinized saline infusion antegradely and heparinized saline retrogradely was advanced in a coaxial manner using the rapid exchange technique without difficulty. The proximal and distal markers were then aligned adequately across the angioplastied segment. This stent was then deployed by inflating the balloon to 6 atmospheres where it was maintained for approximately a minute and half. Thereafter, the balloon was deflated and retrieved proximally. A control arteriogram performed through the intermediate catheter demonstrated excellent flow through the stented segment with brisk perfusion of the posterior circulation. Dominant left anterior-inferior cerebellar artery posterior-inferior cerebellar artery complex was noted. The balloon was then retrieved and removed. Control arteriograms were then performed at 10 20, and 30 minutes post deployment of the stent.  These continued to demonstrate excellent flow  through the stented segment into the posterior circulation. The exchange micro guidewire was retrieved and removed. A final control arteriogram performed through the 7 French sheath at the origin of the right vertebral artery continued to demonstrate opacified right vertebral artery extra cranially and intracranially, with the posterior circulation being widely patent. No evidence of intraluminal filling defects, or of occlusion was evident. The wrist guidewire was removed. A wrist band was then applied for hemostasis at the right radial puncture site. Distal right radial pulse was verified to be present. The patient was then extubated. Upon recovery, the patient denied any nausea, vomiting or headaches. His speech appeared fairly clear. Tongue was in the midline. He was then transferred to the PACU and then neuro ICU for overnight post revascularization management. The patient was given 3 mg of intra arterial Integrilin  to prevent platelet aggregation at the site of the stent placement. IMPRESSION: Status post revascularization of occluded basilar artery with stent assisted angioplasty achieving near complete caliber of the previously occluded basilar artery with initial balloon angioplasty followed by placement of a balloon mountable 2 mm x 12 mm Onyx Frontier stent. PLAN: Follow-up in the clinic 2 weeks post discharge. Electronically Signed   By: Thyra Nash M.D.   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 EXAM: IR ANGIO VERTEBRAL SEL VERTEBRAL UNI RIGHT MOD SED COMPARISON:  CT angiogram of the head and neck of December 17th and MRI of the brain of December 17th, 2024. MEDICATIONS: Heparin  2000 units IV. None antibiotic was administered within 1 hour of the procedure.  ANESTHESIA/SEDATION: Versed  1 mg IV; Fentanyl  25 mcg IV Moderate Sedation Time:  28 minutes The patient was continuously monitored during the procedure by the interventional radiology nurse under my direct supervision. CONTRAST:  Omnipaque  300 approximately 65 mL. FLUOROSCOPY TIME:  Fluoroscopy Time: 8 minutes 54 seconds (777 mGy). COMPLICATIONS: None immediate. TECHNIQUE: Informed written consent was obtained from the patient after a thorough discussion of the procedural risks, benefits and alternatives. All questions were addressed. Maximal Sterile Barrier Technique was utilized including caps, mask, sterile gowns, sterile gloves, sterile drape, hand hygiene and skin antiseptic. A timeout was performed prior to the initiation of the procedure. The right forearm to the wrist was prepped and draped in the usual sterile manner. The right radial artery was identified, and its morphology documented in the radiology PACS system. A dorsal palmar anastomosis was verified to be present. Using ultrasound guidance, a 4/5 French sheath was then placed over an 018 inch micro guidewire. The obturator, and the micro guidewire were removed. Good aspiration was obtained from the side port of the radial sheath. A cocktail of 2000 units of heparin , 200 mcg of nitroglycerin , and 2.5 mg verapamil  was infused in diluted form without event. A right radial arteriogram was performed. Over an 035 inch Roadrunner guidewire, a 5 French Simmons 2 diagnostic catheter was advanced to the aortic arch region, and selectively positioned in the right vertebral artery, the right common carotid artery and the left common carotid artery. A wrist band was applied for hemostasis at the right radial puncture site. Distal right pulse was verified to be present. FINDINGS: The dominant right vertebral artery origin is widely patent. The vessel is seen to opacify to the cranial skull base. Patency is seen of the right posterior-inferior cerebellar artery and  the right  vertebrobasilar junction. More distally, there is a severe pre occlusive stenosis of the proximal basilar artery proximal to the origins of the anterior-inferior cerebellar arteries. The left subclavian arteriogram demonstrates occluded left vertebral artery at its origin without distal reconstitution from the thyrocervical trunk branches. The left common carotid arteriogram demonstrates the left external carotid artery and its major branches to be widely patent. The left internal carotid artery at the bulb to the cranial skull base demonstrates wide patency with mild tortuosity involving the distal 1/3 of the cervical left ICA. The petrous, the cavernous and the supraclinoid left ICA are widely patent. The left middle cerebral artery and left anterior cerebral artery opacify into the capillary and venous phases. Prompt cross-filling via the anterior communicating artery of the right anterior cerebral artery A2 segment is evident. The delayed arterial run demonstrates a diminutive left posterior communicating artery which retrogradely opacifies a small caliber distal basilar artery. The right common carotid arteriogram reveals the right external carotid artery and its major branches to be widely patent. The right internal carotid artery at the bulb to the cranial skull base is widely patent with moderate tortuosity involving its mid 1/3. The petrous, the cavernous and the supraclinoid right ICA are widely patent with mild stenosis involving the supraclinoid ICA. The right middle cerebral artery and the right anterior cerebral artery opacify into the capillary and venous phases. 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 Distal 1/3 of the basilar artery filling retrogradely via a diminutive left posterior communicating artery. 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 PLAN: Findings reviewed with patient and the patient's neurologist.  Electronically Signed   By: Thyra Nash M.D.   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 EXAM: IR ANGIO VERTEBRAL SEL VERTEBRAL UNI RIGHT MOD SED COMPARISON:  CT angiogram of the head and neck of December 17th and MRI of the brain of December 17th, 2024. MEDICATIONS: Heparin  2000 units IV. None antibiotic was administered within 1 hour of the procedure. ANESTHESIA/SEDATION: Versed  1 mg IV; Fentanyl  25 mcg IV Moderate Sedation Time:  28 minutes The patient was continuously monitored during the procedure by the interventional radiology nurse under my direct supervision. CONTRAST:  Omnipaque  300 approximately 65 mL. FLUOROSCOPY TIME:  Fluoroscopy Time: 8 minutes 54 seconds (777 mGy). COMPLICATIONS: None immediate. TECHNIQUE: Informed written consent was obtained from the patient after a thorough discussion of the procedural risks, benefits and alternatives. All questions were addressed. Maximal Sterile Barrier Technique was utilized including caps, mask, sterile gowns, sterile gloves, sterile drape, hand hygiene and skin antiseptic. A timeout was performed prior to the initiation of the procedure. The right forearm to the wrist was prepped and draped in the usual sterile manner. The right radial artery was identified, and its morphology documented in the radiology PACS system. A dorsal palmar anastomosis was verified to be present. Using ultrasound guidance, a 4/5 French sheath was then placed over an 018 inch micro guidewire. The obturator, and the micro guidewire were removed. Good aspiration was obtained from the side port of the radial sheath. A cocktail of 2000 units of heparin , 200 mcg of nitroglycerin , and 2.5 mg verapamil  was infused in diluted form without event. A right radial  arteriogram was performed. Over an 035 inch Roadrunner guidewire, a 5 French Simmons 2 diagnostic  catheter was advanced to the aortic arch region, and selectively positioned in the right vertebral artery, the right common carotid artery and the left common carotid artery. A wrist band was applied for hemostasis at the right radial puncture site. Distal right pulse was verified to be present. FINDINGS: The dominant right vertebral artery origin is widely patent. The vessel is seen to opacify to the cranial skull base. Patency is seen of the right posterior-inferior cerebellar artery and the right vertebrobasilar junction. More distally, there is a severe pre occlusive stenosis of the proximal basilar artery proximal to the origins of the anterior-inferior cerebellar arteries. The left subclavian arteriogram demonstrates occluded left vertebral artery at its origin without distal reconstitution from the thyrocervical trunk branches. The left common carotid arteriogram demonstrates the left external carotid artery and its major branches to be widely patent. The left internal carotid artery at the bulb to the cranial skull base demonstrates wide patency with mild tortuosity involving the distal 1/3 of the cervical left ICA. The petrous, the cavernous and the supraclinoid left ICA are widely patent. The left middle cerebral artery and left anterior cerebral artery opacify into the capillary and venous phases. Prompt cross-filling via the anterior communicating artery of the right anterior cerebral artery A2 segment is evident. The delayed arterial run demonstrates a diminutive left posterior communicating artery which retrogradely opacifies a small caliber distal basilar artery. The right common carotid arteriogram reveals the right external carotid artery and its major branches to be widely patent. The right internal carotid artery at the bulb to the cranial skull base is widely patent with moderate tortuosity involving  its mid 1/3. The petrous, the cavernous and the supraclinoid right ICA are widely patent with mild stenosis involving the supraclinoid ICA. The right middle cerebral artery and the right anterior cerebral artery opacify into the capillary and venous phases. 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 Distal 1/3 of the basilar artery filling retrogradely via a diminutive left posterior communicating artery. 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 PLAN: Findings reviewed with patient and the patient's neurologist. Electronically Signed   By: Thyra Nash M.D.   On: 04/04/2023 07:53   IR ANGIO VERTEBRAL SEL VERTEBRAL UNI R MOD SED Result Date: 04/04/2023 CLINICAL DATA:  Vertebrobasilar ischemic symptoms of gait instability, lack of balance, and ischemic strokes involving the left cerebellar hemisphere. EXAM: IR ANGIO VERTEBRAL SEL VERTEBRAL UNI RIGHT MOD SED COMPARISON:  CT angiogram of the head and neck of December 17th and MRI of the brain of December 17th, 2024. MEDICATIONS: Heparin  2000 units IV. None antibiotic was administered within 1 hour of the procedure. ANESTHESIA/SEDATION: Versed  1 mg IV; Fentanyl  25 mcg IV Moderate Sedation Time:  28 minutes The patient was continuously monitored during the procedure by the interventional radiology nurse under my direct supervision. CONTRAST:  Omnipaque  300 approximately 65 mL. FLUOROSCOPY TIME:  Fluoroscopy Time: 8 minutes 54 seconds (777 mGy). COMPLICATIONS: None immediate. TECHNIQUE: Informed written consent was obtained from the patient after a thorough discussion of the procedural risks, benefits and alternatives. All questions were addressed. Maximal Sterile Barrier Technique was utilized including caps, mask, sterile gowns, sterile gloves, sterile drape, hand hygiene and skin antiseptic. A timeout was performed prior to the initiation of the procedure. The right forearm to the wrist was prepped  and draped in the usual sterile manner. The right radial artery was identified, and its morphology documented in the radiology PACS system. A dorsal palmar anastomosis was verified  to be present. Using ultrasound guidance, a 4/5 French sheath was then placed over an 018 inch micro guidewire. The obturator, and the micro guidewire were removed. Good aspiration was obtained from the side port of the radial sheath. A cocktail of 2000 units of heparin , 200 mcg of nitroglycerin , and 2.5 mg verapamil  was infused in diluted form without event. A right radial arteriogram was performed. Over an 035 inch Roadrunner guidewire, a 5 French Simmons 2 diagnostic catheter was advanced to the aortic arch region, and selectively positioned in the right vertebral artery, the right common carotid artery and the left common carotid artery. A wrist band was applied for hemostasis at the right radial puncture site. Distal right pulse was verified to be present. FINDINGS: The dominant right vertebral artery origin is widely patent. The vessel is seen to opacify to the cranial skull base. Patency is seen of the right posterior-inferior cerebellar artery and the right vertebrobasilar junction. More distally, there is a severe pre occlusive stenosis of the proximal basilar artery proximal to the origins of the anterior-inferior cerebellar arteries. The left subclavian arteriogram demonstrates occluded left vertebral artery at its origin without distal reconstitution from the thyrocervical trunk branches. The left common carotid arteriogram demonstrates the left external carotid artery and its major branches to be widely patent. The left internal carotid artery at the bulb to the cranial skull base demonstrates wide patency with mild tortuosity involving the distal 1/3 of the cervical left ICA. The petrous, the cavernous and the supraclinoid left ICA are widely patent. The left middle cerebral artery and left anterior cerebral artery opacify  into the capillary and venous phases. Prompt cross-filling via the anterior communicating artery of the right anterior cerebral artery A2 segment is evident. The delayed arterial run demonstrates a diminutive left posterior communicating artery which retrogradely opacifies a small caliber distal basilar artery. The right common carotid arteriogram reveals the right external carotid artery and its major branches to be widely patent. The right internal carotid artery at the bulb to the cranial skull base is widely patent with moderate tortuosity involving its mid 1/3. The petrous, the cavernous and the supraclinoid right ICA are widely patent with mild stenosis involving the supraclinoid ICA. The right middle cerebral artery and the right anterior cerebral artery opacify into the capillary and venous phases. 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 Distal 1/3 of the basilar artery filling retrogradely via a diminutive left posterior communicating artery. 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 PLAN: Findings reviewed with patient and the patient's neurologist. Electronically Signed   By: Thyra Nash M.D.   On: 04/04/2023 07:53   IR ANGIO VERTEBRAL SEL SUBCLAVIAN INNOMINATE UNI L MOD SED Result Date: 04/04/2023 CLINICAL DATA:  Vertebrobasilar ischemic symptoms of gait instability, lack of balance, and ischemic strokes involving the left cerebellar hemisphere. EXAM: IR ANGIO VERTEBRAL SEL VERTEBRAL UNI RIGHT MOD SED COMPARISON:  CT angiogram of the head and neck of December 17th and MRI of the brain of December 17th, 2024. MEDICATIONS: Heparin  2000 units IV. None antibiotic was administered within 1 hour of the procedure. ANESTHESIA/SEDATION: Versed  1 mg IV; Fentanyl  25 mcg IV Moderate Sedation Time:  28 minutes The patient was continuously monitored during the procedure by the interventional radiology nurse under my direct supervision.  CONTRAST:  Omnipaque  300 approximately 65 mL. FLUOROSCOPY TIME:  Fluoroscopy Time: 8 minutes 54 seconds (777 mGy). COMPLICATIONS: None immediate. TECHNIQUE: Informed written consent was obtained from the patient after a  thorough discussion of the procedural risks, benefits and alternatives. All questions were addressed. Maximal Sterile Barrier Technique was utilized including caps, mask, sterile gowns, sterile gloves, sterile drape, hand hygiene and skin antiseptic. A timeout was performed prior to the initiation of the procedure. The right forearm to the wrist was prepped and draped in the usual sterile manner. The right radial artery was identified, and its morphology documented in the radiology PACS system. A dorsal palmar anastomosis was verified to be present. Using ultrasound guidance, a 4/5 French sheath was then placed over an 018 inch micro guidewire. The obturator, and the micro guidewire were removed. Good aspiration was obtained from the side port of the radial sheath. A cocktail of 2000 units of heparin , 200 mcg of nitroglycerin , and 2.5 mg verapamil  was infused in diluted form without event. A right radial arteriogram was performed. Over an 035 inch Roadrunner guidewire, a 5 French Simmons 2 diagnostic catheter was advanced to the aortic arch region, and selectively positioned in the right vertebral artery, the right common carotid artery and the left common carotid artery. A wrist band was applied for hemostasis at the right radial puncture site. Distal right pulse was verified to be present. FINDINGS: The dominant right vertebral artery origin is widely patent. The vessel is seen to opacify to the cranial skull base. Patency is seen of the right posterior-inferior cerebellar artery and the right vertebrobasilar junction. More distally, there is a severe pre occlusive stenosis of the proximal basilar artery proximal to the origins of the anterior-inferior cerebellar arteries. The left subclavian  arteriogram demonstrates occluded left vertebral artery at its origin without distal reconstitution from the thyrocervical trunk branches. The left common carotid arteriogram demonstrates the left external carotid artery and its major branches to be widely patent. The left internal carotid artery at the bulb to the cranial skull base demonstrates wide patency with mild tortuosity involving the distal 1/3 of the cervical left ICA. The petrous, the cavernous and the supraclinoid left ICA are widely patent. The left middle cerebral artery and left anterior cerebral artery opacify into the capillary and venous phases. Prompt cross-filling via the anterior communicating artery of the right anterior cerebral artery A2 segment is evident. The delayed arterial run demonstrates a diminutive left posterior communicating artery which retrogradely opacifies a small caliber distal basilar artery. The right common carotid arteriogram reveals the right external carotid artery and its major branches to be widely patent. The right internal carotid artery at the bulb to the cranial skull base is widely patent with moderate tortuosity involving its mid 1/3. The petrous, the cavernous and the supraclinoid right ICA are widely patent with mild stenosis involving the supraclinoid ICA. The right middle cerebral artery and the right anterior cerebral artery opacify into the capillary and venous phases. 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 Distal 1/3 of the basilar artery filling retrogradely via a diminutive left posterior communicating artery. 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 PLAN: Findings reviewed with patient and the patient's neurologist. Electronically Signed   By: Thyra Nash M.D.   On: 04/04/2023 07:53   ECHOCARDIOGRAM COMPLETE Result Date: 04/02/2023    ECHOCARDIOGRAM REPORT   Patient Name:   THOR NANNINI Date of Exam: 04/02/2023 Medical Rec  #:  [id number]      Height:       68.0 in Accession #:    [id number]     Weight:       222.4 lb Date of  Birth:  February 05, 1961      BSA:          2.138 m Patient Age:    62 years       BP:           150/94 mmHg Patient Gender: M              HR:           69 bpm. Exam Location:  Inpatient Procedure: 2D Echo, Cardiac Doppler and Color Doppler Indications:    stroke  History:        Patient has no prior history of Echocardiogram examinations.                 Risk Factors:Diabetes and Hypertension.  Sonographer:    Tinnie Barefoot RDCS Referring Phys: [id number] [facility] IMPRESSIONS  1. Left ventricular ejection fraction, by estimation, is 55 to 60%. The left ventricle has normal function. The left ventricle has no regional wall motion abnormalities. There is moderate concentric left ventricular hypertrophy. Left ventricular diastolic parameters are consistent with Grade I diastolic dysfunction (impaired relaxation).  2. Right ventricular systolic function is normal. The right ventricular size is normal. Tricuspid regurgitation signal is inadequate for assessing PA pressure.  3. The mitral valve is normal in structure. No evidence of mitral valve regurgitation. No evidence of mitral stenosis.  4. The aortic valve is tricuspid. There is mild calcification of the aortic valve. Aortic valve regurgitation is not visualized. No aortic stenosis is present.  5. Aortic dilatation noted. There is mild dilatation of the ascending aorta, measuring 43 mm.  6. The inferior vena cava is normal in size with greater than 50% respiratory variability, suggesting right atrial pressure of 3 mmHg. FINDINGS  Left Ventricle: Left ventricular ejection fraction, by estimation, is 55 to 60%. The left ventricle has normal function. The left ventricle has no regional wall motion abnormalities. The left ventricular internal cavity size was normal in size. There is  moderate concentric left ventricular hypertrophy. Left ventricular diastolic  parameters are consistent with Grade I diastolic dysfunction (impaired relaxation). Right Ventricle: The right ventricular size is normal. No increase in right ventricular wall thickness. Right ventricular systolic function is normal. Tricuspid regurgitation signal is inadequate for assessing PA pressure. Left Atrium: Left atrial size was normal in size. Right Atrium: Right atrial size was normal in size. Pericardium: There is no evidence of pericardial effusion. Mitral Valve: The mitral valve is normal in structure. Mild mitral annular calcification. No evidence of mitral valve regurgitation. No evidence of mitral valve stenosis. Tricuspid Valve: The tricuspid valve is normal in structure. Tricuspid valve regurgitation is not demonstrated. Aortic Valve: The aortic valve is tricuspid. There is mild calcification of the aortic valve. Aortic valve regurgitation is not visualized. No aortic stenosis is present. Pulmonic Valve: The pulmonic valve was normal in structure. Pulmonic valve regurgitation is not visualized. Aorta: Aortic dilatation noted. There is mild dilatation of the ascending aorta, measuring 43 mm. Venous: The inferior vena cava is normal in size with greater than 50% respiratory variability, suggesting right atrial pressure of 3 mmHg. IAS/Shunts: No atrial level shunt detected by color flow Doppler.  LEFT VENTRICLE PLAX 2D LVIDd:         4.20 cm   Diastology LVIDs:         2.90 cm   LV e' medial:    4.90 cm/s LV PW:         1.20 cm   LV E/e' medial:  11.9 LV IVS:        1.40 cm   LV e' lateral:   10.40 cm/s LVOT diam:     2.10 cm   LV E/e' lateral: 5.6 LV SV:         59 LV SV Index:   28 LVOT Area:     3.46 cm  RIGHT VENTRICLE             IVC RV Basal diam:  2.80 cm     IVC diam: 1.70 cm RV S prime:     13.80 cm/s TAPSE (M-mode): 1.6 cm LEFT ATRIUM             Index        RIGHT ATRIUM           Index LA diam:        3.80 cm 1.78 cm/m   RA Area:     14.40 cm LA Vol (A2C):   44.3 ml 20.72 ml/m  RA  Volume:   33.80 ml  15.81 ml/m LA Vol (A4C):   51.4 ml 24.04 ml/m LA Biplane Vol: 50.8 ml 23.76 ml/m  AORTIC VALVE LVOT Vmax:   98.50 cm/s LVOT Vmean:  65.500 cm/s LVOT VTI:    0.170 m  AORTA Ao Root diam: 3.50 cm Ao Asc diam:  4.30 cm MITRAL VALVE MV Area (PHT): 3.42 cm    SHUNTS MV Decel Time: 222 msec    Systemic VTI:  0.17 m MV E velocity: 58.20 cm/s  Systemic Diam: 2.10 cm MV A velocity: 61.60 cm/s MV E/A ratio:  0.94 Dalton McleanMD Electronically signed by Ezra Kanner Signature Date/Time: 04/02/2023/5:07:42 PM    Final    CT ANGIO HEAD NECK W WO CM Result Date: 04/01/2023 CLINICAL DATA:  Left-sided ear pain, speech changes, off balance; acute and subacute infarcts on MRI EXAM: CT ANGIOGRAPHY HEAD AND NECK WITH AND WITHOUT CONTRAST TECHNIQUE: Multidetector CT imaging of the head and neck was performed using the standard protocol during bolus administration of intravenous contrast. Multiplanar CT image reconstructions and MIPs were obtained to evaluate the vascular anatomy. Carotid stenosis measurements (when applicable) are obtained utilizing NASCET criteria, using the distal internal carotid diameter as the denominator. RADIATION DOSE REDUCTION: This exam was performed according to the departmental dose-optimization program which includes automated exposure control, adjustment of the mA and/or kV according to patient size and/or use of iterative reconstruction technique. CONTRAST:  75mL OMNIPAQUE  IOHEXOL  350 MG/ML SOLN COMPARISON:  No prior CTA available, correlation is made with 04/01/2023 CT head and 04/01/2023 MRI head FINDINGS: CT HEAD FINDINGS For noncontrast findings, please see same day CT head. CTA NECK FINDINGS Aortic arch: Standard branching. Imaged portion shows no evidence of aneurysm or dissection. No significant stenosis of the major arch vessel origins. Right carotid system: No evidence of dissection, occlusion, or hemodynamically significant stenosis (greater than 50%). Left  carotid system: No evidence of dissection, occlusion, or hemodynamically significant stenosis (greater than 50%). Vertebral arteries: Nonopacification of the left vertebral artery from its origin through the skull base, which may be chronic. The right vertebral artery is patent from its origin to the skull base without significant stenosis. Skeleton: No acute osseous abnormality. Degenerative changes in the cervical spine. Other neck: No acute finding. Upper chest: No focal pulmonary opacity or pleural effusion. Review of the MIP images confirms the above findings CTA HEAD FINDINGS Anterior circulation: Both internal carotid arteries are patent to the termini, with moderate stenosis in the proximal right  supraclinoid ICA (series 6, images 10 9-110) and mild stenosis in the proximal left supraclinoid ICA (series 6, image 109). A1 segments patent. Normal anterior communicating artery. Anterior cerebral arteries are patent to their distal aspects without significant stenosis. No M1 stenosis or occlusion. MCA branches perfused to their distal aspects without significant stenosis. Posterior circulation: The left vertebral artery is not opacified in the V4 segment. The right vertebral artery is fairly well opacified in the proximal right V4 segment, with diminishing opacification in the more distal right V4 and at the vertebrobasilar junction. Poor opacification of basilar artery, which is not well seen. Better opacification in the left greater than right P1 segment, with poor opacification of the P2 segments and more distal PCAs. The posterior communicating arteries are not definitively seen but are likely patent given minimal opacification of the posterior circulation. Venous sinuses: Not well opacified due to phase of timing. Anatomic variants: None significant. No evidence of aneurysm or vascular malformation. Review of the MIP images confirms the above findings IMPRESSION: 1. Nonopacification of the left vertebral  artery from its origin through the vertebrobasilar junction, which may be chronic. 2. Poor opacification of the remainder of the posterior circulation, with fairly robust opacification the proximal right V4 and P1 segment, but poor opacification of the distal right V4, basilar, and more distal PCAs. 3. Moderate stenosis in the proximal right supraclinoid ICA and mild stenosis in the proximal left supraclinoid ICA. 4. No hemodynamically significant stenosis in the neck. These results will be called to the ordering clinician or representative by the Radiologist Assistant, and communication documented in the PACS or [employer]. Electronically Signed   By: Donald Campion M.D.   On: 04/01/2023 18:51   MR BRAIN WO CONTRAST Addendum Date: 04/01/2023 ADDENDUM REPORT: 04/01/2023 17:00 ADDENDUM: Critical Value/emergent results were called by telephone at the time of interpretation on 04/01/2023 at 4:58 pm to provider Dr. Willo, who verbally acknowledged these results. Results were also discussed with the stroke neurologist on-call, Dr. Jerrie, at 4:39 p.m. Electronically Signed   By: Ryan Chess M.D.   On: 04/01/2023 17:00   Result Date: 04/01/2023 CLINICAL DATA:  Neuro deficit, acute, stroke suspected. Balance difficulty. EXAM: MRI HEAD WITHOUT CONTRAST TECHNIQUE: Multiplanar, multiecho pulse sequences of the brain and surrounding structures were obtained without intravenous contrast. COMPARISON:  Head CT 04/01/2023. FINDINGS: Brain: Scattered foci of acute and subacute infarction throughout the brainstem and left cerebellar hemisphere. Acute hemorrhage or significant mass effect. Background of mild chronic small-vessel disease. The previously questioned left occipital lobe infarct is chronic. No hydrocephalus or extra-axial collection. No mass or abnormal susceptibility Vascular: Loss of the distal vertebral and basilar artery flow voids, suspicious for occlusion. Skull and upper cervical spine: Normal  marrow signal. Sinuses/Orbits: No acute findings. Other: None. IMPRESSION: 1. Scattered foci of acute and subacute infarction throughout the brainstem and left cerebellar hemisphere. 2. Loss of the distal vertebral and basilar artery flow voids, suspicious for occlusion. CTA of the head and neck is recommended for confirmation. Radiology assistant personnel have been notified to put me in telephone contact with the referring physician or the referring physician's clinical representative in order to discuss these findings. Once this communication is established I will issue an addendum to this report for documentation purposes. Electronically Signed: By: Ryan Chess M.D. On: 04/01/2023 16:23   CT HEAD WO CONTRAST Result Date: 04/01/2023 CLINICAL DATA:  Left-sided ear pain EXAM: CT HEAD WITHOUT CONTRAST TECHNIQUE: Contiguous axial images were obtained from the  base of the skull through the vertex without intravenous contrast. RADIATION DOSE REDUCTION: This exam was performed according to the departmental dose-optimization program which includes automated exposure control, adjustment of the mA and/or kV according to patient size and/or use of iterative reconstruction technique. COMPARISON:  Head CT 10/31/22 FINDINGS: Brain: Compared to prior exam there is a new age indeterminate infarct in the left occipital lobe (series 3, image 17). No hemorrhage. No hydrocephalus. No extra-axial fluid collection. No mass effect. No mass lesion. Vascular: No hyperdense vessel or unexpected calcification. Skull: Mild soft tissue swelling along the midline posterior scalp. Calvarial fracture. No focal bone lesion. Sinuses/Orbits: No middle ear or mastoid effusion. Paranasal sinuses are notable for mucosal thickening in the right sphenoid and bilateral maxillary sinuses. Orbits are unremarkable. Other: None. IMPRESSION: New age indeterminate infarct in the left occipital lobe. Recommend brain MRI for further evaluation.  Electronically Signed   By: Lyndall Gore M.D.   On: 04/01/2023 10:02    Micro Results    Recent Results (from the past 240 hours)  Culture, blood (single)     Status: None   Collection Time: 04/10/23 11:08 PM   Specimen: BLOOD  Result Value Ref Range Status   Specimen Description BLOOD RIGHT ANTECUBITAL  Final   Special Requests   Final    BOTTLES DRAWN AEROBIC AND ANAEROBIC Blood Culture results may not be optimal due to an inadequate volume of blood received in culture bottles   Culture   Final    NO GROWTH 5 DAYS Performed at [facility], [address] [place], [place] [address]    Report Status 04/16/2023 FINAL  Final  Resp panel by RT-PCR (RSV, Flu A&B, Covid) Anterior Nasal Swab     Status: None   Collection Time: 04/10/23 11:17 PM   Specimen: Anterior Nasal Swab  Result Value Ref Range Status   SARS Coronavirus 2 by RT PCR NEGATIVE NEGATIVE Final   Influenza A by PCR NEGATIVE NEGATIVE Final   Influenza B by PCR NEGATIVE NEGATIVE Final    Comment: (NOTE) The Xpert Xpress SARS-CoV-2/FLU/RSV plus assay is intended as an aid in the diagnosis of influenza from Nasopharyngeal swab specimens and should not be used as a sole basis for treatment. Nasal washings and aspirates are unacceptable for Xpert Xpress SARS-CoV-2/FLU/RSV testing.  Fact Sheet for Patients: bloggercourse.com  Fact Sheet for Healthcare Providers: seriousbroker.it  This test is not yet approved or cleared by the [place]  FDA and has been authorized for detection and/or diagnosis of SARS-CoV-2 by FDA under an Emergency Use Authorization (EUA). This EUA will remain in effect (meaning this test can be used) for the duration of the COVID-19 declaration under Section 564(b)(1) of the Act, 21 U.S.C. section 360bbb-3(b)(1), unless the authorization is terminated or revoked.     Resp Syncytial Virus by PCR NEGATIVE NEGATIVE Final    Comment:  (NOTE) Fact Sheet for Patients: bloggercourse.com  Fact Sheet for Healthcare Providers: seriousbroker.it  This test is not yet approved or cleared by the [place]  FDA and has been authorized for detection and/or diagnosis of SARS-CoV-2 by FDA under an Emergency Use Authorization (EUA). This EUA will remain in effect (meaning this test can be used) for the duration of the COVID-19 declaration under Section 564(b)(1) of the Act, 21 U.S.C. section 360bbb-3(b)(1), unless the authorization is terminated or revoked.  Performed at [facility], [address] [place], [place] [address]     Today   Subjective  Wlliam Grosso today has no headache,no chest abdominal pain,no new weakness tingling or numbness, feels much better wants to go home today.    Objective   Blood pressure 109/66, pulse 81, temperature 98.3 F (36.8 C), temperature source Oral, resp. rate 17, height 5' 8 (1.727 m), weight 102.1 kg, SpO2 96%.   Intake/Output Summary (Last 24 hours) at 04/16/2023 0820 Last data filed at 04/16/2023 0356 Gross per 24 hour  Intake --  Output 1350 ml  Net -1350 ml    Exam  Awake Alert, No new F.N deficits,    Bodfish.AT,PERRAL Supple Neck,   Symmetrical Chest wall movement, Good air movement bilaterally, CTAB RRR,No Gallops,   +ve B.Sounds, Abd Soft, Non tender,  No Cyanosis, Clubbing or edema    Data Review   Recent Labs  Lab 04/10/23 2114 04/11/23 1708 04/12/23 0311 04/12/23 1100 04/13/23 0251 04/13/23 1726 04/14/23 0439 04/15/23 0514 04/16/23 0456  WBC 17.0*   < > 10.3  --  8.6  --  10.2 9.1 9.2  HGB 12.1*   < > 8.0*   < > 7.4* 7.7* 7.5* 8.1* 7.9*  HCT 36.9*   < > 24.1*   < > 21.9* 23.3* 22.7* 24.3* 23.6*  PLT 337   < > 206  --  214  --  253 288 330  MCV 85.6   < > 85.8  --  85.5  --  86.0 86.5 84.3  MCH 28.1   < > 28.5  --  28.9  --  28.4 28.8 28.2  MCHC 32.8   < > 33.2  --  33.8  --  33.0 33.3 33.5   RDW 16.4*   < > 16.6*  --  16.3*  --  16.3* 16.2* 16.3*  LYMPHSABS 2.0  --   --   --  2.3  --  2.6 2.6 2.3  MONOABS 1.6*  --   --   --  0.6  --  0.8 0.8 0.8  EOSABS 0.2  --   --   --  0.5  --  0.4 0.4 0.4  BASOSABS 0.1  --   --   --  0.1  --  0.1 0.0 0.0   < > = values in this interval not displayed.    Recent Labs  Lab 04/10/23 2114 04/11/23 0238 04/11/23 0437 04/11/23 1517 04/12/23 0311 04/13/23 0251 04/14/23 0439 04/15/23 0514 04/16/23 0456  NA 137  --   --  136 138 139 139 140 139  K 4.4  --   --  4.2 3.8 3.5 3.7 3.7 3.3*  CL 106  --   --  104 107 107 104 106 104  CO2 21*  --   --  23 26 26 26 31 26   ANIONGAP 10  --   --  9 5 6 9  3* 9  GLUCOSE 158*  --   --  112* 95 84 85 91 84  BUN 71*  --   --  60* 27* 14 10 11 14   CREATININE 1.56*  --   --  1.37* 1.25* 1.22 1.34* 1.32* 1.32*  AST 21  --   --  21 18  --   --   --   --   ALT 21  --   --  19 17  --   --   --   --   ALKPHOS 55  --   --  57 45  --   --   --   --  BILITOT 0.6  --   --  0.7 0.7  --   --   --   --   ALBUMIN 3.0*  --   --  2.9* 2.3*  --   --   --   --   LATICACIDVEN  --  1.8 1.7  --   --   --   --   --   --   MG  --   --   --   --   --  1.8 1.9 2.0 1.9  CALCIUM  8.8*  --   --  8.5* 7.9* 8.0* 8.0* 8.2* 8.1*    Total Time in preparing paper work, data evaluation and todays exam - 35 minutes  Signature  -    Lavada Stank M.D on 04/16/2023 at 8:20 AM   -  To page go to [email or web address]

## 2023-04-21 ENCOUNTER — Other Ambulatory Visit: Payer: Self-pay

## 2023-04-21 DIAGNOSIS — E291 Testicular hypofunction: Secondary | ICD-10-CM

## 2023-04-22 ENCOUNTER — Ambulatory Visit: Payer: 59 | Attending: Neurology

## 2023-04-22 ENCOUNTER — Other Ambulatory Visit: Payer: 59

## 2023-04-22 DIAGNOSIS — M6281 Muscle weakness (generalized): Secondary | ICD-10-CM | POA: Insufficient documentation

## 2023-04-22 DIAGNOSIS — R2681 Unsteadiness on feet: Secondary | ICD-10-CM | POA: Diagnosis present

## 2023-04-22 DIAGNOSIS — E291 Testicular hypofunction: Secondary | ICD-10-CM

## 2023-04-22 NOTE — Therapy (Signed)
 OUTPATIENT PHYSICAL THERAPY NEURO EVALUATION   Patient Name: Gavin Dorsey MRN: [id number] DOB:1960-07-14, 63 y.o., male Today's Date: 04/22/2023   PCP: Delaine Glendia Bring, MD  REFERRING PROVIDER: Rosemarie Eather RAMAN, MD   END OF SESSION:  PT End of Session - 04/22/23 1627     Visit Number 1    Number of Visits 17    Date for PT Re-Evaluation 06/17/23    PT Start Time 0847    PT Stop Time 0930    PT Time Calculation (min) 43 min    Equipment Utilized During Treatment Gait belt    Activity Tolerance Patient tolerated treatment well    Behavior During Therapy WFL for tasks assessed/performed             Past Medical History:  Diagnosis Date   Diabetes mellitus without complication (HCC)    Hypertension    Past Surgical History:  Procedure Laterality Date   IR ANGIO INTRA EXTRACRAN SEL COM CAROTID INNOMINATE BILAT MOD SED  04/02/2023   IR ANGIO VERTEBRAL SEL SUBCLAVIAN INNOMINATE UNI L MOD SED  04/02/2023   IR ANGIO VERTEBRAL SEL VERTEBRAL UNI R MOD SED  04/02/2023   IR ANGIO VERTEBRAL SEL VERTEBRAL UNI R MOD SED  04/04/2023   IR CT HEAD LTD  04/04/2023   IR INTRA CRAN STENT  04/04/2023   IR US  GUIDE VASC ACCESS RIGHT  04/02/2023   IR US  GUIDE VASC ACCESS RIGHT  04/04/2023   RADIOLOGY WITH ANESTHESIA N/A 04/04/2023   Procedure: basilar artery angioplasty;  Surgeon: Dolphus Carrion, MD;  Location: [facility] OR;  Service: Radiology;  Laterality: N/A;   Patient Active Problem List   Diagnosis Date Noted   Intracranial carotid stenosis 04/15/2023   GI bleed 04/11/2023   History of stroke in prior 3 months 04/11/2023   Sepsis (HCC) 04/11/2023   Acute cystitis 04/11/2023   Chronic kidney disease (CKD), stage II (mild) 04/11/2023   Chronic diastolic CHF (congestive heart failure) (HCC) 04/11/2023   Non-insulin  dependent type 2 diabetes mellitus (HCC) 04/11/2023   Hyperlipidemia 04/11/2023   Diarrhea 04/11/2023   Nontraumatic intracranial hemorrhage (HCC) 04/11/2023    Adverse effect of platelet aggregation inhibitor 04/11/2023   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  Pt reports initial symptoms prior to stroke felt like an inner ear infection, followed by decreased balance. He was admitted for CVA on 04/01/2023-04/06/2023. Pt reports he returned to hospital 04/10/2023-04/16/2023 due to internal bleeding. Pt was planning on joining a gym prior to stroke, has been active prior to stroke.  He feels since hospitalization he has lost LE strength and he still struggles with decreased balance. He reports 1 fall on stairs in last 6 months. He reports has had some coccyx pain since this fall and reports this is exacerbation of an old injury. Pt lives in an apartment, but is currently living with his friends.  Pt staying with some friends.  Pt accompanied by: self  PERTINENT HISTORY:   PMH per chart also significant for HTN, DMII, HLD, dysphagia secondary to stroke  PAIN:  Are you having pain? Yes: NPRS scale: mild Pain location: coccyx Pain description:   Aggravating factors:   Relieving factors:    PRECAUTIONS: Fall  RED FLAGS: Cervical red flags: pt reports occ  issue with swallowing liquids sincre stroke says he is using a straw. Pt declines speech therapy eval at this time    WEIGHT BEARING RESTRICTIONS: No  FALLS: Has patient fallen in last 6 months? Yes. Number of falls 1  LIVING ENVIRONMENT: Lives with:  currently living with friends, but otherwise lives alone in apartment Lives in: Other has apartment, currently staying with friends Stairs:  patients apartment has 15 steps with bilat handrails   Has following equipment at home:  walk-in shower    PLOF: Independent  PATIENT GOALS: start working out at gym, improve strength/balance  OBJECTIVE:  Note: Objective measures were completed at Evaluation unless otherwise noted.  DIAGNOSTIC FINDINGS: imaging reports via chart  CT HEAD 04/11/23:  IMPRESSION: 1. Known 8 mm acute parenchymal hemorrhage at the right thalamocapsular junction/right cerebral peduncle. Mild surrounding edema, unchanged. 2. Known acute/subacute infarcts within the brainstem, left middle cerebellar peduncle and left cerebellar hemisphere, occult by CT and better appreciated on the brain MRI performed earlier today. 3. 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     Electronically Signed   By: Rockey Childs D.O.   On: 04/11/2023 09:01  MR ANGIO HEAD 04/11/23:  IMPRESSION: 1. Unchanged distal left vertebral artery occlusion. Distal right vertebral artery remains patent. Patent proximal Basilar artery stent and improved distal Basilar, bilateral SCA and PCA flow related enhancement since 03/31/2061. 2. Stable anterior circulation, mild to moderate bilateral supraclinoid ICA atherosclerotic stenosis. 3. Right cerebral peduncle hemorrhage on MRI today is reported separately.     Electronically Signed   By: VEAR Hurst M.D.   On: 04/11/2023 06:26  MR BRAIN 04/11/23:  IMPRESSION: 1. MRI evidence of acute intra-axial hemorrhage  at the right cerebral peduncle, proximally 8 mm. Mild surrounding edema. No significant mass effect. No intraventricular or extra-axial extension identified. 2. Otherwise expected evolution of brainstem and left cerebellar infarcts since 04/01/2023. No other acute intracranial abnormality. 3. Improved basilar artery flow void since 04/01/2023, evidence of continued occlusion distal left vertebral artery. See MRA today reported separately.   Electronically Signed: By: VEAR Hurst M.D. On: 04/11/2023 06:21  Please refer to chart for full details  COGNITION: Overall cognitive status: Within functional limits for tasks assessed   SENSATION: Pt reports  no n/t  COORDINATION: WFL BUE and WFL BLE  EDEMA:  Pt reports no swelling    POSTURE: rounded shoulders  LOWER EXTREMITY MMT:    Grossly 4+/5 BLE, most deficits found with hip mm     TRANSFERS: Assistive device utilized: None  Sit to stand: Complete Independence Stand to sit: Complete Independence Chair to chair: Complete Independence   GAIT: Gait pattern:  unsteadiness observed with head turns/scanning Distance walked: clinic distances/10MWT Assistive device utilized: None Level of assistance: SBA   FUNCTIONAL TESTS:  5 times sit to stand: 11 sec  : 1.1 m/s BERG: retest/complete remaining items next 1-2 visits current impairment found with SLB and tandem stance DGI: deferred   PATIENT SURVEYS:  FOTO 66 (goal 75)                                                                                                                              TREATMENT DATE:   TA: Issued and reviewed HEP in session with pt completing at least one set of each exercise to confirm correct technique:  Access Code: [facility] URL: [email or web address] Date: 04/22/2023 Prepared by: Darryle Patten  Exercises - Sit to Stand  - 1 x daily - 5-6 x weekly - 2 sets - 10 reps - Standing Tandem Balance with Counter Support  - 1  x daily - 5-6 x weekly - 2 sets *correct on form to say 30 seconds per LE - Standing Single Leg Stance with Counter Support  - 1 x daily - 5-6 x weekly - 2 sets - 1*corrected on form to say 30 seconds per LE   PATIENT EDUCATION: Education details: assessment, goals, HEP, plan Person educated: Patient Education method: Explanation, Demonstration, Verbal cues, and Handouts Education comprehension: verbalized understanding and returned demonstration  HOME EXERCISE PROGRAM:   Access Code: [facility] URL: [email or web address] Date: 04/22/2023 Prepared by: Darryle Patten  Exercises - Sit to Stand  - 1 x daily - 5-6 x weekly - 2 sets - 10 reps - Standing Tandem Balance with Counter Support  - 1 x daily - 5-6 x weekly - 2 sets *correct on form to say 30 seconds per LE - Standing Single Leg Stance with Counter Support  - 1 x daily - 5-6 x weekly - 2 sets - 1*corrected on form to say 30 seconds per LE  GOALS: Goals reviewed with patient? Yes   SHORT TERM GOALS: Target date: 05/20/2023    Patient will be independent in home exercise program to improve strength/mobility for better functional independence with ADLs. Baseline: initiated  Goal status: INITIAL   LONG TERM GOALS: Target date: 06/17/2023    Patient will increase FOTO score to equal to or greater than 75  to demonstrate statistically significant improvement in mobility and quality of life.  Baseline: 66 Goal status: INITIAL  2.  Patient will complete five times sit to stand test in < 10 seconds indicating an increased LE strength and improved balance.  Baseline: 11 sec Goal status: INITIAL  3.  Patient will increase Berg Balance score by > 6 points to demonstrate decreased fall risk during functional activities Baseline:  Goal status: INITIAL  4.  Patient will increase 10 meter walk test to >1.5m/s as to improve gait speed for better community ambulation.. Baseline: 1.1 m/s Goal status: INITIAL  5.  Patient  will increase dynamic gait index score to >19/24 as to demonstrate reduced fall risk and improved dynamic gait balance for better safety with community/home ambulation.   Baseline:  Goal status: INITIAL   ASSESSMENT:  CLINICAL IMPRESSION: Patient is a pleasant 63 y.o. male who was seen today for physical therapy evaluation s/p CVA and sepsis. Evaluation indicates impairments of strength, balance, and gait. Pt to complete further balance assessment next visit as he was noted to have instability with head turns/scanning with gait. HEP initiated today to address deficits found during eval. The pt will benefit from further skilled PT to improve balance, strength, gait and mobility in order to return to PLOF and decrease fall risk.  OBJECTIVE IMPAIRMENTS: Abnormal gait, decreased balance, difficulty walking, decreased strength, improper body mechanics, and pain.   ACTIVITY LIMITATIONS: squatting, stairs, and locomotion level  PARTICIPATION LIMITATIONS: community activity and yard work  PERSONAL FACTORS: 1-2 comorbidities: PMH per chart also significant for HTN, DMII, HLD, dysphagia secondary to stroke  are also affecting patient's functional outcome.   REHAB POTENTIAL: Good  CLINICAL DECISION MAKING: Evolving/moderate complexity  EVALUATION COMPLEXITY: Moderate  PLAN:  PT FREQUENCY: 1-2x/week  PT DURATION: 8 weeks  PLANNED INTERVENTIONS: [id number]- PT Re-evaluation, 97110-Therapeutic exercises, 97530- Therapeutic activity, [id number]- Neuromuscular re-education, 97535- Self Care, [id number]- Manual therapy, [id number]- Gait training, [phone]- Canalith repositioning, Joint mobilization, Spinal mobilization, Vestibular training, DME instructions, Cryotherapy, and Moist heat  PLAN FOR NEXT SESSION: complete further assessment, balance, strength   Darryle JONELLE Patten, PT 04/22/2023, 5:03 PM

## 2023-04-23 LAB — TESTOSTERONE: Testosterone: 366 ng/dL (ref 264–916)

## 2023-04-24 ENCOUNTER — Encounter: Payer: Self-pay | Admitting: Urology

## 2023-04-24 ENCOUNTER — Ambulatory Visit: Payer: Self-pay | Admitting: Physical Therapy

## 2023-04-28 ENCOUNTER — Ambulatory Visit: Payer: 59 | Admitting: Physical Therapy

## 2023-04-28 DIAGNOSIS — M6281 Muscle weakness (generalized): Secondary | ICD-10-CM | POA: Diagnosis not present

## 2023-04-28 DIAGNOSIS — R2681 Unsteadiness on feet: Secondary | ICD-10-CM

## 2023-04-28 NOTE — Therapy (Signed)
 OUTPATIENT PHYSICAL THERAPY NEURO EVALUATION   Patient Name: Gavin Dorsey MRN: [id number] DOB:1960-10-10, 63 y.o., male Today's Date: 04/28/2023   PCP: Delaine Glendia Bring, MD  REFERRING PROVIDER: Rosemarie Eather RAMAN, MD   END OF SESSION:  PT End of Session - 04/28/23 0759     Visit Number 2    Number of Visits 17    Date for PT Re-Evaluation 06/17/23    PT Start Time 0802    PT Stop Time 0842    PT Time Calculation (min) 40 min    Equipment Utilized During Treatment Gait belt    Activity Tolerance Patient tolerated treatment well    Behavior During Therapy WFL for tasks assessed/performed             Past Medical History:  Diagnosis Date   Diabetes mellitus without complication (HCC)    Hypertension    Past Surgical History:  Procedure Laterality Date   IR ANGIO INTRA EXTRACRAN SEL COM CAROTID INNOMINATE BILAT MOD SED  04/02/2023   IR ANGIO VERTEBRAL SEL SUBCLAVIAN INNOMINATE UNI L MOD SED  04/02/2023   IR ANGIO VERTEBRAL SEL VERTEBRAL UNI R MOD SED  04/02/2023   IR ANGIO VERTEBRAL SEL VERTEBRAL UNI R MOD SED  04/04/2023   IR CT HEAD LTD  04/04/2023   IR INTRA CRAN STENT  04/04/2023   IR US  GUIDE VASC ACCESS RIGHT  04/02/2023   IR US  GUIDE VASC ACCESS RIGHT  04/04/2023   RADIOLOGY WITH ANESTHESIA N/A 04/04/2023   Procedure: basilar artery angioplasty;  Surgeon: Dolphus Carrion, MD;  Location: [facility] OR;  Service: Radiology;  Laterality: N/A;   Patient Active Problem List   Diagnosis Date Noted   Intracranial carotid stenosis 04/15/2023   GI bleed 04/11/2023   History of stroke in prior 3 months 04/11/2023   Sepsis (HCC) 04/11/2023   Acute cystitis 04/11/2023   Chronic kidney disease (CKD), stage II (mild) 04/11/2023   Chronic diastolic CHF (congestive heart failure) (HCC) 04/11/2023   Non-insulin  dependent type 2 diabetes mellitus (HCC) 04/11/2023   Hyperlipidemia 04/11/2023   Diarrhea 04/11/2023   Nontraumatic intracranial hemorrhage (HCC) 04/11/2023    Adverse effect of platelet aggregation inhibitor 04/11/2023   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 Reports that he now has to manage 15 steps into lofted apartment. States that he has been walking thea long halls and free weights to improve ankle strength over the weekend.    Pt staying with some friends.  Pt accompanied by: self  PERTINENT HISTORY:   PMH per chart also significant for HTN, DMII, HLD, dysphagia secondary to stroke  Pt reports initial symptoms prior to stroke felt like an inner ear infection, followed by decreased balance. He was admitted for CVA on 04/01/2023-04/06/2023. Pt reports he returned to hospital 04/10/2023-04/16/2023 due to internal bleeding. Pt was planning on joining a gym prior to stroke, has been active prior to stroke.  He feels since hospitalization he has lost LE strength and he still struggles with decreased balance. He reports 1 fall on stairs in last 6 months. He reports has had some coccyx pain since this fall and reports this is exacerbation of an old injury. Pt lives in an apartment, but is currently living with his friends. PAIN:  Are you having pain? Yes: NPRS scale: mild Pain location: coccyx Pain description:   Aggravating factors:    Relieving factors:    PRECAUTIONS: Fall  RED FLAGS: Cervical red flags: pt reports occ issue with swallowing liquids sincre stroke says he is using a straw. Pt declines speech therapy eval at this time    WEIGHT BEARING RESTRICTIONS: No  FALLS: Has patient fallen in last 6 months? Yes. Number of falls 1  LIVING ENVIRONMENT: Lives with:  currently living with friends, but otherwise lives alone in apartment Lives in: Other has apartment, currently staying with friends Stairs:  patients apartment has 15 steps with bilat handrails   Has following equipment at home:  walk-in shower    PLOF: Independent  PATIENT GOALS: start working out at gym, improve strength/balance  OBJECTIVE:  Note: Objective measures were completed at Evaluation unless otherwise noted.  DIAGNOSTIC FINDINGS: imaging reports via chart  CT HEAD 04/11/23:  IMPRESSION: 1. Known 8 mm acute parenchymal hemorrhage at the right thalamocapsular junction/right cerebral peduncle. Mild surrounding edema, unchanged. 2. Known acute/subacute infarcts within the brainstem, left middle cerebellar peduncle and left cerebellar hemisphere, occult by CT and better appreciated on the brain MRI performed earlier today. 3. 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     Electronically Signed   By: Rockey Childs D.O.   On: 04/11/2023 09:01  MR ANGIO HEAD 04/11/23:  IMPRESSION: 1. Unchanged distal left vertebral artery occlusion. Distal right vertebral artery remains patent. Patent proximal Basilar artery stent and improved distal Basilar, bilateral SCA and PCA flow related enhancement since 03/31/2061. 2. Stable anterior circulation, mild to moderate bilateral supraclinoid ICA atherosclerotic stenosis. 3. Right cerebral peduncle hemorrhage on MRI today is reported separately.     Electronically Signed   By: VEAR Hurst M.D.   On:  04/11/2023 06:26  MR BRAIN 04/11/23:  IMPRESSION: 1. MRI evidence of acute intra-axial hemorrhage at the right cerebral peduncle, proximally 8 mm. Mild surrounding edema. No significant mass effect. No intraventricular or extra-axial extension identified. 2. Otherwise expected evolution of brainstem and left cerebellar infarcts since 04/01/2023. No other acute intracranial abnormality. 3. Improved basilar artery flow void since 04/01/2023, evidence of continued occlusion distal left vertebral artery. See MRA today reported separately.   Electronically Signed: By: VEAR Hurst M.D. On: 04/11/2023 06:21  Please refer to chart for full  details  COGNITION: Overall cognitive status: Within functional limits for tasks assessed   SENSATION: Pt reports no n/t  COORDINATION: WFL BUE and WFL BLE  EDEMA:  Pt reports no swelling    POSTURE: rounded shoulders  LOWER EXTREMITY MMT:    Grossly 4+/5 BLE, most deficits found with hip mm     TRANSFERS: Assistive device utilized: None  Sit to stand: Complete Independence Stand to sit: Complete Independence Chair to chair: Complete Independence   GAIT: Gait pattern:  unsteadiness observed with head turns/scanning Distance walked: clinic distances/10MWT Assistive device utilized: None Level of assistance: SBA   FUNCTIONAL TESTS:  5 times sit to stand: 11 sec  : 1.1 m/s BERG: retest/complete remaining items next 1-2 visits current impairment found with SLB and tandem stance DGI: deferred   PATIENT SURVEYS:  FOTO 66 (goal 75)                                                                                                                              TREATMENT DATE:   Nustep level 2-5 x 6 min. With cues for consistent SPM through various resistance   Patient demonstrates increased fall risk as noted by score of   50/56 on Berg Balance Scale.  (<36= high risk for falls, close to 100%; 37-45 significant >80%; 46-51  moderate >50%; 52-55 lower >25%)  Patient demonstrates increased fall risk as noted by score of 23/30 on  Functional Gait Assessment.   <22/30 = predictive of falls, <20/30 = fall in 6 months, <18/30 = predictive of falls in PD MCID: 5 points stroke population, 4 points geriatric population (ANPTA Core Set of Outcome Measures for Adults with Neurologic Conditions, 2018)   PT instructed pt in DGI. See below for results. Demonstrates increased fall risk with score of 22/24. (<19 indicates increased fall risk)    STS 2x 10 cues for speed and to prevent anchoring knees on back of chair.   Feet apart VOR 1  x 15 sec  Feet together VOR 1 x x 15 sec  Feet apart eyes closed x 15 sec  Feet together eyes closed 3 x 10 sec   Gait with head nods 27ft x 6 with cues for decreased speed to prevent veer to the R and L. Mild veer R and L .    Tandem stance 20 sce hold x 3 bil   Forward/reverse gait 10 ft x 3.   Seated LAQ 5# AW x 12  Seated hip flexion 5# AWx 12 Standing hip extension 5# AWx 10   CGA for safety with DGI and dynamic gait training with noted instability with head nods and eyes closed.    PATIENT EDUCATION: Education details: assessment, goals, HEP, plan Person educated: Patient Education method: Explanation, Demonstration, Verbal cues, and Handouts Education comprehension: verbalized understanding and returned demonstration  HOME EXERCISE PROGRAM:   Access Code: [facility] URL: [email or web address] Date: 04/28/2023 Prepared by: Massie Dollar  Exercises - Sit  to Stand  - 1 x daily - 5-6 x weekly - 2 sets - 10 reps - Standing Tandem Balance with Counter Support  - 1 x daily - 5-6 x weekly - 3 sets - 10 reps - Standing Single Leg Stance with Counter Support  - 1 x daily - 5-6 x weekly - 3 sets - 10 reps - Seated Long Arc Quad with Ankle Weight  - 1 x daily - 7 x weekly - 3 sets - 10 reps - Standing Hip Extension with Ankle Weight  - 1 x daily - 7 x weekly - 3  sets - 10 reps - Feet Together Balance at [employer] Eyes Closed  - 1 x daily - 7 x weekly - 3 sets - 10 reps - Standing with Head Nod  - 1 x daily - 7 x weekly - 3 sets - 10 reps  GOALS: Goals reviewed with patient? Yes   SHORT TERM GOALS: Target date: 05/20/2023    Patient will be independent in home exercise program to improve strength/mobility for better functional independence with ADLs. Baseline: initiated  Goal status: INITIAL   LONG TERM GOALS: Target date: 06/17/2023    Patient will increase FOTO score to equal to or greater than 75  to demonstrate statistically significant improvement in mobility and quality of life.  Baseline: 66 Goal status: INITIAL  2.  Patient will complete five times sit to stand test in < 10 seconds indicating an increased LE strength and improved balance. Baseline: 11 sec Goal status: INITIAL  3.  Patient will increase Berg Balance score by > 6 points to demonstrate decreased fall risk during functional activities Baseline: 50 Goal status: INITIAL  4.  Patient will increase 10 meter walk test to >1.48m/s as to improve gait speed for better community ambulation.. Baseline: 1.1 m/s Goal status: INITIAL  5.  Patient will increase dynamic gait index score to >19/24 as to demonstrate reduced fall risk and improved dynamic gait balance for better safety with community/home ambulation.   Baseline: 22 Goal status: MET   ASSESSMENT:  CLINICAL IMPRESSION: Patient is a pleasant 63 y.o. male who was seen today for physical therapy treatment evaluation s/p CVA and sepsis. PT treatment focused on completion of balance assessments and dynamic balance/strengthening. HEP modified and provided by PT. Mild instability with eyes closed, head nods, and tandem. The pt will benefit from further skilled PT to improve balance, strength, gait and mobility in order to return to PLOF and decrease fall risk.  OBJECTIVE IMPAIRMENTS: Abnormal gait, decreased balance,  difficulty walking, decreased strength, improper body mechanics, and pain.   ACTIVITY LIMITATIONS: squatting, stairs, and locomotion level  PARTICIPATION LIMITATIONS: community activity and yard work  PERSONAL FACTORS: 1-2 comorbidities: PMH per chart also significant for HTN, DMII, HLD, dysphagia secondary to stroke  are also affecting patient's functional outcome.   REHAB POTENTIAL: Good  CLINICAL DECISION MAKING: Evolving/moderate complexity  EVALUATION COMPLEXITY: Moderate  PLAN:  PT FREQUENCY: 1-2x/week  PT DURATION: 8 weeks  PLANNED INTERVENTIONS: 97164- PT Re-evaluation, 97110-Therapeutic exercises, 97530- Therapeutic activity, 97112- Neuromuscular re-education, 97535- Self Care, [id number]- Manual therapy, [id number]- Gait training, [phone]- Canalith repositioning, Joint mobilization, Spinal mobilization, Vestibular training, DME instructions, Cryotherapy, and Moist heat  PLAN FOR NEXT SESSION:   continue dynamic balance and BLE strengthening.    Massie FORBES Dollar, PT 04/28/2023, 8:00 AM

## 2023-04-30 ENCOUNTER — Telehealth (HOSPITAL_COMMUNITY): Payer: Self-pay

## 2023-04-30 NOTE — Telephone Encounter (Signed)
 Called to reschedule consult. Pt said that he doesn't have any family and it is hard to keep asking friends to take off work to bring him all the way to [place]. He sees neurology on 1/20 and will find out if he can drive. Then things will be easier. Driving to [place] will still be a bit of a stretch. Had our PA Lunda Salines) speak with Dr. Alvira Josephs. He prefers for pt to come for f/u but if he absolutely cannot, he will need to do a f/u cta head/neck at the end of March. Relayed this message to Gavin Dorsey. AB

## 2023-05-01 ENCOUNTER — Ambulatory Visit: Payer: 59

## 2023-05-05 ENCOUNTER — Encounter: Payer: Self-pay | Admitting: Neurology

## 2023-05-05 ENCOUNTER — Ambulatory Visit: Payer: 59 | Admitting: Physical Therapy

## 2023-05-05 ENCOUNTER — Ambulatory Visit (HOSPITAL_COMMUNITY): Payer: 59

## 2023-05-05 ENCOUNTER — Ambulatory Visit (INDEPENDENT_AMBULATORY_CARE_PROVIDER_SITE_OTHER): Payer: 59 | Admitting: Neurology

## 2023-05-05 VITALS — BP 141/86 | HR 89 | Ht 68.0 in | Wt 235.0 lb

## 2023-05-05 DIAGNOSIS — S0636AA Traumatic hemorrhage of cerebrum, unspecified, with loss of consciousness status unknown, initial encounter: Secondary | ICD-10-CM

## 2023-05-05 DIAGNOSIS — I69398 Other sequelae of cerebral infarction: Secondary | ICD-10-CM

## 2023-05-05 DIAGNOSIS — R269 Unspecified abnormalities of gait and mobility: Secondary | ICD-10-CM

## 2023-05-05 DIAGNOSIS — I6322 Cerebral infarction due to unspecified occlusion or stenosis of basilar arteries: Secondary | ICD-10-CM | POA: Diagnosis not present

## 2023-05-05 NOTE — Progress Notes (Signed)
[facility] [address] [place]. [place] [address] [phone]       OFFICE FOLLOW-UP NOTE  [name] Date of Birth:  03-31-1961 Medical Record Number:  [id number]   HPI: Gavin Dorsey is a 63 year old Caucasian male seen today for initial office visit following hospital admission for stroke in December 2024.  History is obtained from the patient and review of electronic medical records.  I personally reviewed pertinent available imaging films in PACS.  He has past medical history of diabetes, hypertension and obesity.  He presented initially on 04/06/2023 with sudden onset of left ear pain, tinnitus and imbalance.  He stated this began after he went to a shooting range a few days after Thanksgiving.  He subsequently got worse and developed trouble with his walking feeling off balance and almost laid down but did not hit his head on the ground.  CT head showed no acute abnormalities but however MRI scan of the brain showed scattered left cerebellar and brainstem infarcts and CT angiogram and demonstrated left vertebral artery occlusion with poor opacification of the remainder of the posterior circulation including the distal right vertebral V4 segment and basilar and more distal PCAs.  Patient was loaded with aspirin and Plavix and transferred to [facility] neuro ICU for closer monitoring and diagnostic angiogram.  NIH stroke scale was only 1 on admission for limb ataxia.  2D echo showed ejection fraction 55 to 60%.  LDL cholesterol is 101 mg percent.  Hemoglobin A1c was 5.2.  Cerebral angiogram on 04/02/2023 showed near occlusion of the right vertebral basilar junction just distal to the left right PICA with a string sign opacification of the proximal one third of the proximal half of the basilar artery.  Distal one third of the basilar artery opacified retrogradely via severely diseased left P-comm.  Left vertebral artery was occluded at its origin without any distal  reconstitution.  Patient was loaded with Brilinta he underwent elective angioplasty and stenting of the basilar artery on 04/04/2023.  He did very well and was discharged home 2 days later with no residual deficits.  He however was admitted again on 04/11/2023 with concern for GI bleed with significant 4 point drop in his hemoglobin.  MRI scan of the brain and MRA showed patent basilar artery stent with unchanged distal left vertebral artery occlusion however there was a small intra-axial hemorrhage of the right cerebral peduncle measuring 8 mm with mild surrounding edema but no significant mass effect.  No intraventricular extension.  Brilinta was held and patient was started temporarily on aspirin 81 and IV heparin and subsequently discharged on aspirin 81 and Brilinta 45 twice daily however patient could not afford it hence he was switched to aspirin 81 and Plavix 75 mg daily dual antiplatelet therapy which he is currently taking.  Patient states is done well since discharge.  He is tolerating aspirin and Plavix with minor bruising but no bleeding.  He has remained hemodynamically stable.  He has had no recurrent TIA or stroke symptoms.  He is now having regular bowel movements.  He states he is doing outpatient physical occupational therapy but had to recently missed a few appointments.  States his balance is better but is not back to baseline.  He has no new complaints.  ROS:   14 system review of systems is positive for dizziness, bilious vomiting, nausea gait difficulty, imbalance all other systems negative  PMH:  Past Medical History:  Diagnosis Date   Diabetes mellitus  without complication (HCC)    Hypertension     Social History:  Social History   Socioeconomic History   Marital status: Single    Spouse name: Not on file   Number of children: Not on file   Years of education: Not on file   Highest education level: Not on file  Occupational History   Not on file  Tobacco Use    Smoking status: Never   Smokeless tobacco: Never  Substance and Sexual Activity   Alcohol use: Yes   Drug use: Never   Sexual activity: Yes  Other Topics Concern   Not on file  Social History Narrative   Not on file   Social Drivers of Health   Financial Resource Strain: Not on file  Food Insecurity: No Food Insecurity (04/11/2023)   Hunger Vital Sign    Worried About Running Out of Food in the Last Year: Never true    Ran Out of Food in the Last Year: Never true  Transportation Needs: No Transportation Needs (04/11/2023)   PRAPARE - Administrator, Civil Service (Medical): No    Lack of Transportation (Non-Medical): No  Physical Activity: Not on file  Stress: Not on file  Social Connections: Not on file  Intimate Partner Violence: Not At Risk (04/11/2023)   Humiliation, Afraid, Rape, and Kick questionnaire    Fear of Current or Ex-Partner: No    Emotionally Abused: No    Physically Abused: No    Sexually Abused: No    Medications:   Current Outpatient Medications on File Prior to Visit  Medication Sig Dispense Refill   aspirin EC 81 MG tablet Take 81 mg by mouth daily. Swallow whole.     clopidogrel (PLAVIX) 75 MG tablet Take 1 tablet (75 mg total) by mouth daily. 30 tablet 2   pantoprazole (PROTONIX) 40 MG tablet Take 1 tablet (40 mg total) by mouth 2 (two) times daily before a meal. 60 tablet 2   rosuvastatin (CRESTOR) 40 MG tablet Take 1 tablet (40 mg total) by mouth daily. 30 tablet 2   sucralfate (CARAFATE) 1 GM/10ML suspension Take 10 mLs (1 g total) by mouth 4 (four) times daily -  with meals and at bedtime. 420 mL 0   No current facility-administered medications on file prior to visit.    Allergies:  No Known Allergies  Physical Exam General: well developed, well nourished, seated, in no evident distress Head: head normocephalic and atraumatic.  Neck: supple with no carotid or supraclavicular bruits Cardiovascular: regular rate and rhythm, no  murmurs Musculoskeletal: no deformity Skin:  no rash/petichiae Vascular:  Normal pulses all extremities Vitals:   05/05/23 1506  BP: (!) 141/86  Pulse: 89   Neurologic Exam Mental Status: Awake and fully alert. Oriented to place and time. Recent and remote memory intact. Attention span, concentration and fund of knowledge appropriate. Mood and affect appropriate.  Cranial Nerves: Fundoscopic exam reveals sharp disc margins. Pupils equal, briskly reactive to light. Extraocular movements full without nystagmus. Visual fields full to confrontation. Hearing intact. Facial sensation intact. Face, tongue, palate moves normally and symmetrically.  Motor: Normal bulk and tone. Normal strength in all tested extremity muscles. Sensory.: intact to touch ,pinprick .position and vibratory sensation.  Coordination: Rapid alternating movements normal in all extremities. Finger-to-nose and heel-to-shin performed accurately bilaterally. Gait and Station: Arises from chair without difficulty. Stance is normal. Gait demonstrates normal stride length and balance . Able to heel, toe and tandem walk with moderate  difficulty.  Reflexes: 1+ and symmetric. Toes downgoing.   NIHSS  0 Modified Rankin  1   ASSESSMENT: 63 year old Caucasian male with brainstem and cerebellar infarct in December 2024 secondary to symptomatic basilar artery stenosis treated with angioplasty stenting who returned in January 2025 with GI bleed as well as small right subcortical hemorrhage.  He is doing quite well with minimal residual deficits except mild gait imbalance     PLAN:I had a long d/w patient about his recent brainstem stroke, symptomatic basilar artery stenosis s/p angioplasty stenting, recent GI bleed and small intracerebral hemorrhage, risk for recurrent stroke/TIAs, personally independently reviewed imaging studies and stroke evaluation results and answered questions.Continue aspirin 81 mg daily and clopidogrel 75 mg daily   for secondary stroke prevention and maintain strict control of hypertension with blood pressure goal below 130/90, diabetes with hemoglobin A1c goal below 6.5% and lipids with LDL cholesterol goal below 70 mg/dL. I also advised the patient to eat a healthy diet with plenty of whole grains, cereals, fruits and vegetables, exercise regularly and maintain ideal body weight patient may return back to work and can drive.  Followup in the future with me in 6 months or call earlier if necessary.  Greater than 50% of time during this 40 minute visit was spent on counseling,explanation of diagnosis basilar artery stenosis, brainstem stroke, angioplasty stenting, GI hemorrhage, small brain hemorrhage, planning of further management, discussion with patient and family and coordination of care Delia Heady, MD Note: This document was prepared with digital dictation and possible smart phrase technology. Any transcriptional errors that result from this process are unintentional

## 2023-05-05 NOTE — Patient Instructions (Signed)
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 I also advised the patient to eat a healthy diet with plenty of whole grains, cereals, fruits and vegetables, exercise regularly and maintain ideal body weight patient may return back to work and can drive.  Followup in the future with me in 6 months or call earlier if necessary.  Stroke Prevention Some medical conditions and behaviors can lead to a higher chance of having a stroke. You can help prevent a stroke by eating healthy, exercising, not smoking, and managing any medical conditions you have. Stroke is a leading cause of functional impairment. Primary prevention is particularly important because a majority of strokes are first-time events. Stroke changes the lives of not only those who experience a stroke but also their family and other caregivers. How can this condition affect me? A stroke is a medical emergency and should be treated right away. A stroke can lead to brain damage and can sometimes be life-threatening. If a person gets medical treatment right away, there is a better chance of surviving and recovering from a stroke. What can increase my risk? The following medical conditions may increase your risk of a stroke: Cardiovascular disease. High blood pressure (hypertension). Diabetes. High cholesterol. Sickle cell disease. Blood clotting disorders (hypercoagulable state). Obesity. Sleep disorders (obstructive sleep  apnea). Other risk factors include: Being older than age 71. Having a history of blood clots, stroke, or mini-stroke (transient ischemic attack, TIA). Genetic factors, such as race, ethnicity, or a family history of stroke. Smoking cigarettes or using other tobacco products. Taking birth control pills, especially if you also use tobacco. Heavy use of alcohol or drugs, especially cocaine and methamphetamine. Physical inactivity. What actions can I take to prevent this? Manage your health conditions High cholesterol levels. Eating a healthy diet is important for preventing high cholesterol. If cholesterol cannot be managed through diet alone, you may need to take medicines. Take any prescribed medicines to control your cholesterol as told by your health care provider. Hypertension. To reduce your risk of stroke, try to keep your blood pressure below 130/80. Eating a healthy diet and exercising regularly are important for controlling blood pressure. If these steps are not enough to manage your blood pressure, you may need to take medicines. Take any prescribed medicines to control hypertension as told by your health care provider. Ask your health care provider if you should monitor your blood pressure at home. Have your blood pressure checked every year, even if your blood pressure is normal. Blood pressure increases with age and some medical conditions. Diabetes. Eating a healthy diet and exercising regularly are important parts of managing your blood sugar (glucose). If your blood sugar cannot be managed through diet and exercise, you may need to take medicines. Take any prescribed medicines to control your diabetes as told by your health care provider. Get evaluated for obstructive sleep apnea. Talk to your health care provider about getting a sleep evaluation if you snore a lot or have excessive sleepiness. Make sure that any other medical conditions you have, such as atrial  fibrillation or  atherosclerosis, are managed. Nutrition Follow instructions from your health care provider about what to eat or drink to help manage your health condition. These instructions may include: Reducing your daily calorie intake. Limiting how much salt (sodium) you use to 1,500 milligrams (mg) each day. Using only healthy fats for cooking, such as olive oil, canola oil, or sunflower oil. Eating healthy foods. You can do this by: Choosing foods that are high in fiber, such as whole grains, and fresh fruits and vegetables. Eating at least 5 servings of fruits and vegetables a day. Try to fill one-half of your plate with fruits and vegetables at each meal. Choosing lean protein foods, such as lean cuts of meat, poultry without skin, fish, tofu, beans, and nuts. Eating low-fat dairy products. Avoiding foods that are high in sodium. This can help lower blood pressure. Avoiding foods that have saturated fat, trans fat, and cholesterol. This can help prevent high cholesterol. Avoiding processed and prepared foods. Counting your daily carbohydrate intake.  Lifestyle If you drink alcohol: Limit how much you have to: 0-1 drink a day for women who are not pregnant. 0-2 drinks a day for men. Know how much alcohol is in your drink. In the [place] one drink equals one 12 oz bottle of beer ( ), one 5 oz glass of wine ( ), or one 1 oz glass of hard liquor (44mL). Do not use any products that contain nicotine or tobacco. These products include cigarettes, chewing tobacco, and vaping devices, such as e-cigarettes. If you need help quitting, ask your health care provider. Avoid secondhand smoke. Do not use drugs. Activity  Try to stay at a healthy weight. Get at least 30 minutes of exercise on most days, such as: Fast walking. Biking. Swimming. Medicines Take over-the-counter and prescription medicines only as told by your health care provider. Aspirin or blood thinners (antiplatelets or  anticoagulants) may be recommended to reduce your risk of forming blood clots that can lead to stroke. Avoid taking birth control pills. Talk to your health care provider about the risks of taking birth control pills if: You are over 9 years old. You smoke. You get very bad headaches. You have had a blood clot. Where to find more information American Stroke Association: www.strokeassociation.org Get help right away if: You or a loved one has any symptoms of a stroke. "BE FAST" is an easy way to remember the main warning signs of a stroke: B - Balance. Signs are dizziness, sudden trouble walking, or loss of balance. E - Eyes. Signs are trouble seeing or a sudden change in vision. F - Face. Signs are sudden weakness or numbness of the face, or the face or eyelid drooping on one side. A - Arms. Signs are weakness or numbness in an arm. This happens suddenly and usually on one side of the body. S - Speech. Signs are sudden trouble speaking, slurred speech, or trouble understanding what people say. T - Time. Time to call emergency services. Write down what time symptoms started. You or a loved one has other signs of a stroke, such as: A sudden, severe headache with no known cause. Nausea or vomiting. Seizure. These symptoms may represent a serious problem that is an emergency. Do not wait to see if the symptoms will go away. Get medical help right away. Call your local emergency services (911 in the [place] Do not drive yourself to the hospital. Summary You can help to prevent a stroke by eating healthy, exercising, not smoking,  limiting alcohol intake, and managing any medical conditions you may have. Do not use any products that contain nicotine or tobacco. These include cigarettes, chewing tobacco, and vaping devices, such as e-cigarettes. If you need help quitting, ask your health care provider. Remember "BE FAST" for warning signs of a stroke. Get help right away if you or a loved one has any  of these signs. This information is not intended to replace advice given to you by your health care provider. Make sure you discuss any questions you have with your health care provider. Document Revised: 03/04/2022 Document Reviewed: 03/04/2022 Elsevier Patient Education  2024 [employer].

## 2023-05-09 ENCOUNTER — Ambulatory Visit: Payer: 59

## 2023-05-09 NOTE — Therapy (Incomplete)
OUTPATIENT PHYSICAL THERAPY NEURO TREATMENT   Patient Name: Gavin Dorsey MRN: [id number] DOB:12-18-1960, 63 y.o., male Today's Date: 05/09/2023   PCP: Simonne Martinet, MD  REFERRING PROVIDER: Micki Riley, MD   END OF SESSION:    Past Medical History:  Diagnosis Date   Diabetes mellitus without complication (HCC)    Hypertension    Past Surgical History:  Procedure Laterality Date   IR ANGIO INTRA EXTRACRAN SEL COM CAROTID INNOMINATE BILAT MOD SED  04/02/2023   IR ANGIO VERTEBRAL SEL SUBCLAVIAN INNOMINATE UNI L MOD SED  04/02/2023   IR ANGIO VERTEBRAL SEL VERTEBRAL UNI R MOD SED  04/02/2023   IR ANGIO VERTEBRAL SEL VERTEBRAL UNI R MOD SED  04/04/2023   IR CT HEAD LTD  04/04/2023   IR INTRA CRAN STENT  04/04/2023   IR US GUIDE VASC ACCESS RIGHT  04/02/2023   IR US GUIDE VASC ACCESS RIGHT  04/04/2023   RADIOLOGY WITH ANESTHESIA N/A 04/04/2023   Procedure: basilar artery angioplasty;  Surgeon: Julieanne Cotton, MD;  Location: [facility] OR;  Service: Radiology;  Laterality: N/A;   Patient Active Problem List   Diagnosis Date Noted   Intracranial carotid stenosis 04/15/2023   GI bleed 04/11/2023   History of stroke in prior 3 months 04/11/2023   Sepsis (HCC) 04/11/2023   Acute cystitis 04/11/2023   Chronic kidney disease (CKD), stage II (mild) 04/11/2023   Chronic diastolic CHF (congestive heart failure) (HCC) 04/11/2023   Non-insulin dependent type 2 diabetes mellitus (HCC) 04/11/2023   Hyperlipidemia 04/11/2023   Diarrhea 04/11/2023   Nontraumatic intracranial hemorrhage (HCC) 04/11/2023   Adverse effect of platelet aggregation inhibitor 04/11/2023   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  Rationale for Evaluation and Treatment: Rehabilitation  SUBJECTIVE:                                                                                                                                                                                             SUBJECTIVE STATEMENT:   Reports moving back into apart by himself. Reports that he now has to manage 15 steps into lofted apartment. States that he has been walking thea "long halls" and free weights to improve ankle strength over the weekend.    Pt staying with some friends.  Pt accompanied  by: self  PERTINENT HISTORY:   PMH per chart also significant for HTN, DMII, HLD, dysphagia secondary to stroke  Pt reports initial symptoms prior to stroke felt like an "inner ear infection," followed by decreased balance. He was admitted for CVA on 04/01/2023-04/06/2023. Pt reports he returned to hospital 04/10/2023-04/16/2023 due to "internal bleeding." Pt was planning on joining a gym prior to stroke, has been active prior to stroke.  He feels since hospitalization he has lost LE strength and he still struggles with decreased balance. He reports 1 fall on stairs in last 6 months. He reports has had some coccyx pain since this fall and reports this is exacerbation of an old injury. Pt lives in an apartment, but is currently living with his friends. PAIN:  Are you having pain? Yes: NPRS scale: mild Pain location: coccyx Pain description:   Aggravating factors:   Relieving factors:    PRECAUTIONS: Fall  RED FLAGS: Cervical red flags: pt reports occ issue with swallowing liquids sincre stroke says he is using a straw. Pt declines speech therapy eval at this time    WEIGHT BEARING RESTRICTIONS: No  FALLS: Has patient fallen in last 6 months? Yes. Number of falls 1  LIVING ENVIRONMENT: Lives with:  currently living with friends, but otherwise lives alone in  apartment Lives in: Other has apartment, currently staying with friends Stairs:  patients apartment has 15 steps with bilat handrails   Has following equipment at home:  walk-in shower    PLOF: Independent  PATIENT GOALS: start working out at gym, improve strength/balance  OBJECTIVE:  Note: Objective measures were completed at Evaluation unless otherwise noted.  DIAGNOSTIC FINDINGS: imaging reports via chart  CT HEAD 04/11/23: " IMPRESSION: 1. Known 8 mm acute parenchymal hemorrhage at the right thalamocapsular junction/right cerebral peduncle. Mild surrounding edema, unchanged. 2. Known acute/subacute infarcts within the brainstem, left middle cerebellar peduncle and left cerebellar hemisphere, occult by CT and better appreciated on the brain MRI performed earlier today. 3. 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     Electronically Signed   By: Jackey Loge D.O.   On: 04/11/2023 09:01"  MR ANGIO HEAD 04/11/23: " IMPRESSION: 1. Unchanged distal left vertebral artery occlusion. Distal right vertebral artery remains patent. Patent proximal Basilar artery stent and improved distal Basilar, bilateral SCA and PCA flow related enhancement since 03/31/2061. 2. Stable anterior circulation, mild to moderate bilateral supraclinoid ICA atherosclerotic stenosis. 3. Right cerebral peduncle hemorrhage on MRI today is reported separately.     Electronically Signed   By: Odessa Fleming M.D.   On: 04/11/2023 06:26"  MR BRAIN 04/11/23: " IMPRESSION: 1. MRI evidence of acute intra-axial hemorrhage at the right cerebral peduncle, proximally 8 mm. Mild surrounding edema. No significant mass effect. No intraventricular or extra-axial extension identified. 2. Otherwise expected evolution of brainstem and left cerebellar infarcts since 04/01/2023. No other acute intracranial abnormality. 3. Improved basilar  artery flow void since 04/01/2023, evidence of continued occlusion distal left vertebral artery. See MRA today reported separately.   Electronically Signed: By: Odessa Fleming M.D. On: 04/11/2023 06:21"  Please refer to chart for full details  COGNITION: Overall cognitive status: Within functional limits for tasks assessed   SENSATION: Pt reports no n/t  COORDINATION: WFL BUE and WFL BLE  EDEMA:  Pt reports no swelling    POSTURE: rounded shoulders  LOWER EXTREMITY MMT:    Grossly 4+/5 BLE, most deficits found with  hip mm     TRANSFERS: Assistive device utilized: None  Sit to stand: Complete Independence Stand to sit: Complete Independence Chair to chair: Complete Independence   GAIT: Gait pattern:  unsteadiness observed with head turns/scanning Distance walked: clinic distances/10MWT Assistive device utilized: None Level of assistance: SBA   FUNCTIONAL TESTS:  5 times sit to stand: 11 sec  : 1.1 m/s BERG: retest/complete remaining items next 1-2 visits current impairment found with SLB and tandem stance DGI: deferred   PATIENT SURVEYS:  FOTO 66 (goal 75)                                                                                                                              TREATMENT DATE:  TherEx:  Nustep level 2 interval with 5 x 6 min. With cues for consistent SPM through various resistance   STS 2x 10 cues for speed and to prevent anchoring knees on back of chair.   Seated LAQ 5# AW x 12  Seated hip flexion 5# AWx 12 Standing hip extension 5# AWx 10   Neuro Re-ed  In // bar: Forward/backward ambulation 4x  Standing with CGA next to support surface:  Airex pad: static stand 30 seconds x 2 trials, noticeable trembling of ankles/LE's with fatigue and challenge to maintain stability Airex pad: horizontal head turns 30 seconds scanning room 10x ; cueing for arc of motion  Airex pad: vertical head turns 30 seconds, cueing for arc of motion,  noticeable sway with upward gaze increasing demand on ankle righting reaction musculature Airex pad: one foot on 6" step one foot on airex pad, hold position for 30 seconds, switch legs, 2x each LE;   Tandem stance 20 sce hold x 3 bil     PATIENT EDUCATION: Education details: assessment, goals, HEP, plan Person educated: Patient Education method: Explanation, Demonstration, Verbal cues, and Handouts Education comprehension: verbalized understanding and returned demonstration  HOME EXERCISE PROGRAM:   Access Code: [facility] URL: [email or web address] Date: 04/28/2023 Prepared by: Grier Rocher  Exercises - Sit to Stand  - 1 x daily - 5-6 x weekly - 2 sets - 10 reps - Standing Tandem Balance with Counter Support  - 1 x daily - 5-6 x weekly - 3 sets - 10 reps - Standing Single Leg Stance with Counter Support  - 1 x daily - 5-6 x weekly - 3 sets - 10 reps - Seated Long Arc Quad with Ankle Weight  - 1 x daily - 7 x weekly - 3 sets - 10 reps - Standing Hip Extension with Ankle Weight  - 1 x daily - 7 x weekly - 3 sets - 10 reps - Feet Together Balance at [employer] Eyes Closed  - 1 x daily - 7 x weekly - 3 sets - 10 reps - Standing with Head Nod  - 1 x daily - 7 x weekly - 3 sets - 10 reps  GOALS: Goals reviewed with patient? Yes  SHORT TERM GOALS: Target date: 05/20/2023    Patient will be independent in home exercise program to improve strength/mobility for better functional independence with ADLs. Baseline: initiated  Goal status: INITIAL   LONG TERM GOALS: Target date: 06/17/2023    Patient will increase FOTO score to equal to or greater than 75  to demonstrate statistically significant improvement in mobility and quality of life.  Baseline: 66 Goal status: INITIAL  2.  Patient will complete five times sit to stand test in < 10 seconds indicating an increased LE strength and improved balance. Baseline: 11 sec Goal status: INITIAL  3.  Patient will  increase Berg Balance score by > 6 points to demonstrate decreased fall risk during functional activities Baseline: 50 Goal status: INITIAL  4.  Patient will increase 10 meter walk test to >1.35m/s as to improve gait speed for better community ambulation.. Baseline: 1.1 m/s Goal status: INITIAL  5.  Patient will increase dynamic gait index score to >19/24 as to demonstrate reduced fall risk and improved dynamic gait balance for better safety with community/home ambulation.   Baseline: 22 Goal status: MET   ASSESSMENT:  CLINICAL IMPRESSION: ***The pt will benefit from further skilled PT to improve balance, strength, gait and mobility in order to return to PLOF and decrease fall risk.  OBJECTIVE IMPAIRMENTS: Abnormal gait, decreased balance, difficulty walking, decreased strength, improper body mechanics, and pain.   ACTIVITY LIMITATIONS: squatting, stairs, and locomotion level  PARTICIPATION LIMITATIONS: community activity and yard work  PERSONAL FACTORS: 1-2 comorbidities: PMH per chart also significant for HTN, DMII, HLD, dysphagia secondary to stroke  are also affecting patient's functional outcome.   REHAB POTENTIAL: Good  CLINICAL DECISION MAKING: Evolving/moderate complexity  EVALUATION COMPLEXITY: Moderate  PLAN:  PT FREQUENCY: 1-2x/week  PT DURATION: 8 weeks  PLANNED INTERVENTIONS: 97164- PT Re-evaluation, 97110-Therapeutic exercises, 97530- Therapeutic activity, 97112- Neuromuscular re-education, 97535- Self Care, [id number]- Manual therapy, [id number]- Gait training, [phone]- Canalith repositioning, Joint mobilization, Spinal mobilization, Vestibular training, DME instructions, Cryotherapy, and Moist heat  PLAN FOR NEXT SESSION:   continue dynamic balance and BLE strengthening.    Lenda Kelp, PT 05/09/2023, 7:50 AM

## 2023-05-14 ENCOUNTER — Ambulatory Visit: Payer: 59 | Admitting: Physical Therapy

## 2023-05-14 ENCOUNTER — Telehealth: Payer: Self-pay

## 2023-05-14 NOTE — Telephone Encounter (Signed)
Pt did not arrive for scheduled appointment. No telephone call nor message preceeded this absence. Author attempted to contact pt via telephone number listed in chart. No answer, no option to leave VM. Pt immediately returned call- he is informed of missed appointment. Pt reports he spoke to unnamed male staff on Friday at previous no show and asked to be DC from PT and all visits to be canceled. Author confirmed pt's desire to be DC.    12:47 PM, 05/14/23 Rosamaria Lints, PT, DPT Physical Therapist -  [facility]  Outpatient Physical Therapy- [facility] [phone]

## 2023-05-19 ENCOUNTER — Ambulatory Visit: Payer: 59 | Admitting: Physical Therapy

## 2023-05-22 ENCOUNTER — Ambulatory Visit: Payer: 59 | Admitting: Physical Therapy

## 2023-05-26 ENCOUNTER — Ambulatory Visit: Payer: Self-pay | Admitting: Physical Therapy

## 2023-05-29 ENCOUNTER — Ambulatory Visit: Payer: Self-pay | Admitting: Physical Therapy

## 2023-06-02 ENCOUNTER — Ambulatory Visit: Payer: Self-pay | Admitting: Physical Therapy

## 2023-06-05 ENCOUNTER — Ambulatory Visit: Payer: Self-pay | Admitting: Physical Therapy

## 2023-06-09 ENCOUNTER — Ambulatory Visit: Payer: Self-pay | Admitting: Physical Therapy

## 2023-06-12 ENCOUNTER — Ambulatory Visit: Payer: Self-pay | Admitting: Physical Therapy

## 2023-06-16 ENCOUNTER — Ambulatory Visit: Payer: Self-pay | Admitting: Physical Therapy

## 2023-06-19 ENCOUNTER — Ambulatory Visit: Payer: Self-pay | Admitting: Physical Therapy

## 2023-06-23 ENCOUNTER — Ambulatory Visit: Payer: Self-pay | Admitting: Physical Therapy

## 2023-06-26 ENCOUNTER — Emergency Department (HOSPITAL_COMMUNITY)

## 2023-06-26 ENCOUNTER — Ambulatory Visit: Payer: Self-pay | Admitting: Physical Therapy

## 2023-06-26 ENCOUNTER — Emergency Department (HOSPITAL_COMMUNITY): Admitting: Anesthesiology

## 2023-06-26 ENCOUNTER — Inpatient Hospital Stay (HOSPITAL_COMMUNITY)

## 2023-06-26 ENCOUNTER — Encounter (HOSPITAL_COMMUNITY): Admission: EM | Disposition: E | Payer: Self-pay | Source: Home / Self Care | Attending: Neurology

## 2023-06-26 ENCOUNTER — Encounter (HOSPITAL_COMMUNITY): Payer: Self-pay | Admitting: Neurology

## 2023-06-26 ENCOUNTER — Inpatient Hospital Stay (HOSPITAL_COMMUNITY)
Admission: EM | Admit: 2023-06-26 | Discharge: 2023-07-15 | DRG: 252 | Disposition: E | Attending: Neurology | Admitting: Neurology

## 2023-06-26 DIAGNOSIS — G9340 Encephalopathy, unspecified: Secondary | ICD-10-CM | POA: Diagnosis not present

## 2023-06-26 DIAGNOSIS — Z781 Physical restraint status: Secondary | ICD-10-CM

## 2023-06-26 DIAGNOSIS — E876 Hypokalemia: Secondary | ICD-10-CM | POA: Diagnosis not present

## 2023-06-26 DIAGNOSIS — E669 Obesity, unspecified: Secondary | ICD-10-CM | POA: Diagnosis present

## 2023-06-26 DIAGNOSIS — J9601 Acute respiratory failure with hypoxia: Secondary | ICD-10-CM | POA: Diagnosis not present

## 2023-06-26 DIAGNOSIS — E7800 Pure hypercholesterolemia, unspecified: Secondary | ICD-10-CM | POA: Diagnosis present

## 2023-06-26 DIAGNOSIS — R29714 NIHSS score 14: Secondary | ICD-10-CM | POA: Diagnosis present

## 2023-06-26 DIAGNOSIS — I77810 Thoracic aortic ectasia: Secondary | ICD-10-CM | POA: Diagnosis present

## 2023-06-26 DIAGNOSIS — Z79899 Other long term (current) drug therapy: Secondary | ICD-10-CM

## 2023-06-26 DIAGNOSIS — Z7982 Long term (current) use of aspirin: Secondary | ICD-10-CM

## 2023-06-26 DIAGNOSIS — Z789 Other specified health status: Secondary | ICD-10-CM | POA: Diagnosis not present

## 2023-06-26 DIAGNOSIS — H4902 Third [oculomotor] nerve palsy, left eye: Secondary | ICD-10-CM | POA: Diagnosis present

## 2023-06-26 DIAGNOSIS — R29726 NIHSS score 26: Secondary | ICD-10-CM | POA: Diagnosis not present

## 2023-06-26 DIAGNOSIS — Y712 Prosthetic and other implants, materials and accessory cardiovascular devices associated with adverse incidents: Secondary | ICD-10-CM | POA: Diagnosis present

## 2023-06-26 DIAGNOSIS — G8191 Hemiplegia, unspecified affecting right dominant side: Secondary | ICD-10-CM | POA: Diagnosis present

## 2023-06-26 DIAGNOSIS — I130 Hypertensive heart and chronic kidney disease with heart failure and stage 1 through stage 4 chronic kidney disease, or unspecified chronic kidney disease: Secondary | ICD-10-CM | POA: Diagnosis not present

## 2023-06-26 DIAGNOSIS — I6350 Cerebral infarction due to unspecified occlusion or stenosis of unspecified cerebral artery: Secondary | ICD-10-CM | POA: Diagnosis not present

## 2023-06-26 DIAGNOSIS — E119 Type 2 diabetes mellitus without complications: Secondary | ICD-10-CM | POA: Diagnosis present

## 2023-06-26 DIAGNOSIS — R471 Dysarthria and anarthria: Secondary | ICD-10-CM | POA: Diagnosis present

## 2023-06-26 DIAGNOSIS — T82856A Stenosis of peripheral vascular stent, initial encounter: Secondary | ICD-10-CM | POA: Diagnosis present

## 2023-06-26 DIAGNOSIS — E785 Hyperlipidemia, unspecified: Secondary | ICD-10-CM | POA: Diagnosis not present

## 2023-06-26 DIAGNOSIS — Z8719 Personal history of other diseases of the digestive system: Secondary | ICD-10-CM

## 2023-06-26 DIAGNOSIS — J9600 Acute respiratory failure, unspecified whether with hypoxia or hypercapnia: Secondary | ICD-10-CM | POA: Diagnosis not present

## 2023-06-26 DIAGNOSIS — Z7189 Other specified counseling: Secondary | ICD-10-CM | POA: Diagnosis not present

## 2023-06-26 DIAGNOSIS — I119 Hypertensive heart disease without heart failure: Secondary | ICD-10-CM | POA: Diagnosis present

## 2023-06-26 DIAGNOSIS — I5032 Chronic diastolic (congestive) heart failure: Secondary | ICD-10-CM

## 2023-06-26 DIAGNOSIS — Z515 Encounter for palliative care: Secondary | ICD-10-CM

## 2023-06-26 DIAGNOSIS — I639 Cerebral infarction, unspecified: Principal | ICD-10-CM | POA: Diagnosis present

## 2023-06-26 DIAGNOSIS — Z66 Do not resuscitate: Secondary | ICD-10-CM | POA: Diagnosis not present

## 2023-06-26 DIAGNOSIS — Z888 Allergy status to other drugs, medicaments and biological substances status: Secondary | ICD-10-CM

## 2023-06-26 DIAGNOSIS — R29810 Facial weakness: Secondary | ICD-10-CM | POA: Diagnosis present

## 2023-06-26 DIAGNOSIS — D696 Thrombocytopenia, unspecified: Secondary | ICD-10-CM | POA: Diagnosis not present

## 2023-06-26 DIAGNOSIS — D649 Anemia, unspecified: Secondary | ICD-10-CM | POA: Diagnosis present

## 2023-06-26 DIAGNOSIS — N182 Chronic kidney disease, stage 2 (mild): Secondary | ICD-10-CM | POA: Diagnosis not present

## 2023-06-26 DIAGNOSIS — R29705 NIHSS score 5: Secondary | ICD-10-CM | POA: Diagnosis present

## 2023-06-26 DIAGNOSIS — T82898A Other specified complication of vascular prosthetic devices, implants and grafts, initial encounter: Principal | ICD-10-CM | POA: Diagnosis present

## 2023-06-26 DIAGNOSIS — J189 Pneumonia, unspecified organism: Secondary | ICD-10-CM | POA: Diagnosis not present

## 2023-06-26 DIAGNOSIS — R569 Unspecified convulsions: Secondary | ICD-10-CM | POA: Diagnosis not present

## 2023-06-26 DIAGNOSIS — Z8673 Personal history of transient ischemic attack (TIA), and cerebral infarction without residual deficits: Secondary | ICD-10-CM

## 2023-06-26 DIAGNOSIS — I6302 Cerebral infarction due to thrombosis of basilar artery: Secondary | ICD-10-CM | POA: Diagnosis present

## 2023-06-26 DIAGNOSIS — I6329 Cerebral infarction due to unspecified occlusion or stenosis of other precerebral arteries: Secondary | ICD-10-CM | POA: Diagnosis not present

## 2023-06-26 DIAGNOSIS — Z6841 Body Mass Index (BMI) 40.0 and over, adult: Secondary | ICD-10-CM

## 2023-06-26 DIAGNOSIS — I6501 Occlusion and stenosis of right vertebral artery: Secondary | ICD-10-CM | POA: Diagnosis not present

## 2023-06-26 DIAGNOSIS — I6339 Cerebral infarction due to thrombosis of other cerebral artery: Secondary | ICD-10-CM

## 2023-06-26 DIAGNOSIS — Z1152 Encounter for screening for COVID-19: Secondary | ICD-10-CM | POA: Diagnosis not present

## 2023-06-26 DIAGNOSIS — I63543 Cerebral infarction due to unspecified occlusion or stenosis of bilateral cerebellar arteries: Secondary | ICD-10-CM | POA: Diagnosis not present

## 2023-06-26 DIAGNOSIS — R1310 Dysphagia, unspecified: Secondary | ICD-10-CM | POA: Diagnosis present

## 2023-06-26 DIAGNOSIS — N289 Disorder of kidney and ureter, unspecified: Secondary | ICD-10-CM | POA: Diagnosis present

## 2023-06-26 DIAGNOSIS — Z9181 History of falling: Secondary | ICD-10-CM

## 2023-06-26 DIAGNOSIS — I6322 Cerebral infarction due to unspecified occlusion or stenosis of basilar arteries: Secondary | ICD-10-CM | POA: Diagnosis present

## 2023-06-26 DIAGNOSIS — R0989 Other specified symptoms and signs involving the circulatory and respiratory systems: Secondary | ICD-10-CM | POA: Diagnosis not present

## 2023-06-26 DIAGNOSIS — I63213 Cerebral infarction due to unspecified occlusion or stenosis of bilateral vertebral arteries: Secondary | ICD-10-CM | POA: Diagnosis not present

## 2023-06-26 DIAGNOSIS — I6389 Other cerebral infarction: Secondary | ICD-10-CM | POA: Diagnosis not present

## 2023-06-26 DIAGNOSIS — Z7902 Long term (current) use of antithrombotics/antiplatelets: Secondary | ICD-10-CM

## 2023-06-26 HISTORY — PX: RADIOLOGY WITH ANESTHESIA: SHX6223

## 2023-06-26 HISTORY — PX: IR CT HEAD LTD: IMG2386

## 2023-06-26 HISTORY — PX: IR PERCUTANEOUS ART THROMBECTOMY/INFUSION INTRACRANIAL INC DIAG ANGIO: IMG6087

## 2023-06-26 HISTORY — PX: IR US GUIDE VASC ACCESS RIGHT: IMG2390

## 2023-06-26 LAB — ETHANOL: Alcohol, Ethyl (B): <10 mg/dL (ref <10)

## 2023-06-26 LAB — CBC
HCT: 33.9 % — ABNORMAL LOW (ref 39.0–52.0)
Hemoglobin: 9.9 g/dL — ABNORMAL LOW (ref 13.0–17.0)
MCH: 22.7 pg — ABNORMAL LOW (ref 26.0–34.0)
MCHC: 29.2 g/dL — ABNORMAL LOW (ref 30.0–36.0)
MCV: 77.6 fL — ABNORMAL LOW (ref 80.0–100.0)
Platelets: 357 10*3/uL (ref 150–400)
RBC: 4.37 MIL/uL (ref 4.22–5.81)
RDW: 17.7 % — ABNORMAL HIGH (ref 11.5–15.5)
WBC: 7.1 10*3/uL (ref 4.0–10.5)
nRBC: 0 % (ref 0.0–0.2)

## 2023-06-26 LAB — GLUCOSE, CAPILLARY
Glucose-Capillary: 152 mg/dL — ABNORMAL HIGH (ref 70–99)
Glucose-Capillary: 168 mg/dL — ABNORMAL HIGH (ref 70–99)

## 2023-06-26 LAB — DIFFERENTIAL
Abs Immature Granulocytes: 0.02 10*3/uL (ref 0.00–0.07)
Basophils Absolute: 0.1 10*3/uL (ref 0.0–0.1)
Basophils Relative: 1 %
Eosinophils Absolute: 0.5 10*3/uL (ref 0.0–0.5)
Eosinophils Relative: 8 %
Immature Granulocytes: 0 %
Lymphocytes Relative: 30 %
Lymphs Abs: 2.1 10*3/uL (ref 0.7–4.0)
Monocytes Absolute: 0.9 10*3/uL (ref 0.1–1.0)
Monocytes Relative: 12 %
Neutro Abs: 3.5 10*3/uL (ref 1.7–7.7)
Neutrophils Relative %: 49 %

## 2023-06-26 LAB — COMPREHENSIVE METABOLIC PANEL
ALT: 12 U/L (ref 0–44)
AST: 19 U/L (ref 15–41)
Albumin: 3.6 g/dL (ref 3.5–5.0)
Alkaline Phosphatase: 57 U/L (ref 38–126)
Anion gap: 6 (ref 5–15)
BUN: 18 mg/dL (ref 8–23)
CO2: 31 mmol/L (ref 22–32)
Calcium: 8.6 mg/dL — ABNORMAL LOW (ref 8.9–10.3)
Chloride: 103 mmol/L (ref 98–111)
Creatinine, Ser: 1.23 mg/dL (ref 0.61–1.24)
GFR, Estimated: >60 mL/min (ref >60)
Glucose, Bld: 109 mg/dL — ABNORMAL HIGH (ref 70–99)
Potassium: 4 mmol/L (ref 3.5–5.1)
Sodium: 140 mmol/L (ref 135–145)
Total Bilirubin: 0.4 mg/dL (ref 0.0–1.2)
Total Protein: 7.4 g/dL (ref 6.5–8.1)

## 2023-06-26 LAB — POCT I-STAT 7, (LYTES, BLD GAS, ICA,H+H)
Acid-Base Excess: 2 mmol/L (ref 0.0–2.0)
Bicarbonate: 27.7 mmol/L (ref 20.0–28.0)
Calcium, Ion: 1.15 mmol/L (ref 1.15–1.40)
HCT: 33 % — ABNORMAL LOW (ref 39.0–52.0)
Hemoglobin: 11.2 g/dL — ABNORMAL LOW (ref 13.0–17.0)
O2 Saturation: 96 %
Patient temperature: 96.8
Potassium: 3.9 mmol/L (ref 3.5–5.1)
Sodium: 139 mmol/L (ref 135–145)
TCO2: 29 mmol/L (ref 22–32)
pCO2 arterial: 47.4 mmHg (ref 32–48)
pH, Arterial: 7.371 (ref 7.35–7.45)
pO2, Arterial: 83 mmHg (ref 83–108)

## 2023-06-26 LAB — I-STAT CHEM 8, ED
BUN: 19 mg/dL (ref 8–23)
Calcium, Ion: 1.05 mmol/L — ABNORMAL LOW (ref 1.15–1.40)
Chloride: 102 mmol/L (ref 98–111)
Creatinine, Ser: 1.3 mg/dL — ABNORMAL HIGH (ref 0.61–1.24)
Glucose, Bld: 107 mg/dL — ABNORMAL HIGH (ref 70–99)
HCT: 34 % — ABNORMAL LOW (ref 39.0–52.0)
Hemoglobin: 11.6 g/dL — ABNORMAL LOW (ref 13.0–17.0)
Potassium: 4 mmol/L (ref 3.5–5.1)
Sodium: 139 mmol/L (ref 135–145)
TCO2: 28 mmol/L (ref 22–32)

## 2023-06-26 LAB — RESP PANEL BY RT-PCR (RSV, FLU A&B, COVID)  RVPGX2
Influenza A by PCR: NEGATIVE
Influenza B by PCR: NEGATIVE
Resp Syncytial Virus by PCR: NEGATIVE
SARS Coronavirus 2 by RT PCR: NEGATIVE

## 2023-06-26 LAB — PROTIME-INR
INR: 1.1 (ref 0.8–1.2)
Prothrombin Time: 14.2 s (ref 11.4–15.2)

## 2023-06-26 LAB — APTT: aPTT: 29 s (ref 24–36)

## 2023-06-26 LAB — CBG MONITORING, ED: Glucose-Capillary: 116 mg/dL — ABNORMAL HIGH (ref 70–99)

## 2023-06-26 SURGERY — RADIOLOGY WITH ANESTHESIA
Anesthesia: General

## 2023-06-26 MED ORDER — DOCUSATE SODIUM 50 MG/5ML PO LIQD
100.0000 mg | Freq: Two times a day (BID) | ORAL | Status: DC
  Administered 2023-06-27 – 2023-06-29 (×5): 100 mg
  Filled 2023-06-26 (×5): qty 10

## 2023-06-26 MED ORDER — NITROGLYCERIN 1 MG/10 ML FOR IR/CATH LAB
INTRA_ARTERIAL | Status: AC
  Filled 2023-06-26: qty 10

## 2023-06-26 MED ORDER — CLEVIDIPINE BUTYRATE 0.5 MG/ML IV EMUL
0.0000 mg/h | INTRAVENOUS | Status: DC
Start: 2023-06-27 — End: 2023-06-28

## 2023-06-26 MED ORDER — LACTATED RINGERS IV SOLN
INTRAVENOUS | Status: DC | PRN
Start: 2023-06-26 — End: 2023-06-26

## 2023-06-26 MED ORDER — ASPIRIN 81 MG PO CHEW: 81.0000 mg | CHEWABLE_TABLET | Freq: Every day | ORAL | Status: DC

## 2023-06-26 MED ORDER — EPTIFIBATIDE 20 MG/10ML IV SOLN
INTRAVENOUS | Status: AC
  Filled 2023-06-26: qty 10

## 2023-06-26 MED ORDER — IOHEXOL 300 MG/ML  SOLN
100.0000 mL | Freq: Once | INTRAMUSCULAR | Status: AC | PRN
  Administered 2023-06-26: 10 mL via INTRA_ARTERIAL

## 2023-06-26 MED ORDER — IOHEXOL 300 MG/ML  SOLN
150.0000 mL | Freq: Once | INTRAMUSCULAR | Status: AC | PRN
  Administered 2023-06-26: 100 mL via INTRA_ARTERIAL

## 2023-06-26 MED ORDER — ASPIRIN 81 MG PO CHEW
81.0000 mg | CHEWABLE_TABLET | Freq: Every day | ORAL | Status: DC
  Administered 2023-06-27 – 2023-06-29 (×3): 81 mg
  Filled 2023-06-26 (×3): qty 1

## 2023-06-26 MED ORDER — HEPARIN SODIUM (PORCINE) 1000 UNIT/ML IJ SOLN
INTRAMUSCULAR | Status: AC | PRN
Start: 2023-06-26 — End: 2023-06-26
  Administered 2023-06-26: 2000 [IU] via INTRAVENOUS

## 2023-06-26 MED ORDER — PHENYLEPHRINE HCL-NACL 20-0.9 MG/250ML-% IV SOLN
INTRAVENOUS | Status: DC | PRN
Start: 2023-06-26 — End: 2023-06-26
  Administered 2023-06-26: 50 ug/min via INTRAVENOUS

## 2023-06-26 MED ORDER — PROPOFOL 500 MG/50ML IV EMUL
INTRAVENOUS | Status: DC | PRN
  Administered 2023-06-26: 40 ug/kg/min via INTRAVENOUS

## 2023-06-26 MED ORDER — LIDOCAINE HCL 1 % IJ SOLN
INTRAMUSCULAR | Status: AC
  Filled 2023-06-26: qty 20

## 2023-06-26 MED ORDER — SODIUM CHLORIDE 0.9 % IV SOLN: INTRAVENOUS | Status: DC

## 2023-06-26 MED ORDER — CLEVIDIPINE BUTYRATE 0.5 MG/ML IV EMUL: 0.0000 mg/h | INTRAVENOUS | Status: DC

## 2023-06-26 MED ORDER — ORAL CARE MOUTH RINSE: 15.0000 mL | OROMUCOSAL | Status: DC | PRN

## 2023-06-26 MED ORDER — LIDOCAINE 2% (20 MG/ML) 5 ML SYRINGE
INTRAMUSCULAR | Status: DC | PRN
Start: 2023-06-26 — End: 2023-06-26
  Administered 2023-06-26: 100 mg via INTRAVENOUS

## 2023-06-26 MED ORDER — LABETALOL HCL 5 MG/ML IV SOLN: 10.0000 mg | INTRAVENOUS | Status: DC | PRN

## 2023-06-26 MED ORDER — FENTANYL CITRATE (PF) 100 MCG/2ML IJ SOLN
INTRAMUSCULAR | Status: DC | PRN
  Administered 2023-06-26: 100 ug via INTRAVENOUS

## 2023-06-26 MED ORDER — ACETAMINOPHEN 325 MG PO TABS: 650.0000 mg | ORAL_TABLET | ORAL | Status: DC | PRN

## 2023-06-26 MED ORDER — CLOPIDOGREL BISULFATE 75 MG PO TABS
ORAL_TABLET | ORAL | Status: AC | PRN
  Administered 2023-06-26: 150 mg

## 2023-06-26 MED ORDER — FENTANYL CITRATE PF 50 MCG/ML IJ SOSY: 50.0000 ug | PREFILLED_SYRINGE | INTRAMUSCULAR | Status: DC | PRN

## 2023-06-26 MED ORDER — PROPOFOL 1000 MG/100ML IV EMUL
0.0000 ug/kg/min | INTRAVENOUS | Status: DC
  Administered 2023-06-26 – 2023-06-27 (×3): 30 ug/kg/min via INTRAVENOUS
  Filled 2023-06-26 (×3): qty 100

## 2023-06-26 MED ORDER — CHLORHEXIDINE GLUCONATE CLOTH 2 % EX PADS
6.0000 | MEDICATED_PAD | Freq: Every day | CUTANEOUS | Status: DC
  Administered 2023-06-26 – 2023-06-29 (×3): 6 via TOPICAL

## 2023-06-26 MED ORDER — HEPARIN SODIUM (PORCINE) 1000 UNIT/ML IJ SOLN
INTRAMUSCULAR | Status: AC
  Filled 2023-06-26: qty 10

## 2023-06-26 MED ORDER — SENNOSIDES-DOCUSATE SODIUM 8.6-50 MG PO TABS: 1.0000 | ORAL_TABLET | Freq: Every evening | ORAL | Status: DC | PRN

## 2023-06-26 MED ORDER — EPHEDRINE SULFATE-NACL 50-0.9 MG/10ML-% IV SOSY
PREFILLED_SYRINGE | INTRAVENOUS | Status: DC | PRN
  Administered 2023-06-26: 5 mg via INTRAVENOUS

## 2023-06-26 MED ORDER — ROCURONIUM BROMIDE 100 MG/10ML IV SOLN
INTRAVENOUS | Status: DC | PRN
  Administered 2023-06-26: 100 mg via INTRAVENOUS
  Administered 2023-06-26: 10 mg via INTRAVENOUS

## 2023-06-26 MED ORDER — ASPIRIN 325 MG PO TABS
ORAL_TABLET | ORAL | Status: AC | PRN
  Administered 2023-06-26: 81 mg

## 2023-06-26 MED ORDER — SODIUM CHLORIDE (PF) 0.9 % IJ SOLN
INTRAVENOUS | Status: AC | PRN
  Administered 2023-06-26: 50 ug via INTRA_ARTERIAL
  Administered 2023-06-26: 200 ug via INTRA_ARTERIAL

## 2023-06-26 MED ORDER — CLOPIDOGREL BISULFATE 75 MG PO TABS
ORAL_TABLET | ORAL | Status: AC
  Filled 2023-06-26: qty 2

## 2023-06-26 MED ORDER — IOHEXOL 350 MG/ML SOLN
75.0000 mL | Freq: Once | INTRAVENOUS | Status: AC | PRN
  Administered 2023-06-26: 85 mL via INTRAVENOUS

## 2023-06-26 MED ORDER — PROPOFOL 10 MG/ML IV BOLUS
INTRAVENOUS | Status: DC | PRN
  Administered 2023-06-26: 150 mg via INTRAVENOUS

## 2023-06-26 MED ORDER — PHENYLEPHRINE 80 MCG/ML (10ML) SYRINGE FOR IV PUSH (FOR BLOOD PRESSURE SUPPORT)
PREFILLED_SYRINGE | INTRAVENOUS | Status: DC | PRN
  Administered 2023-06-26: 80 ug via INTRAVENOUS
  Administered 2023-06-26 (×2): 240 ug via INTRAVENOUS
  Administered 2023-06-26: 160 ug via INTRAVENOUS
  Administered 2023-06-26: 120 ug via INTRAVENOUS
  Administered 2023-06-26 (×2): 160 ug via INTRAVENOUS

## 2023-06-26 MED ORDER — POLYETHYLENE GLYCOL 3350 17 G PO PACK
17.0000 g | PACK | Freq: Every day | ORAL | Status: DC
  Administered 2023-06-27 – 2023-06-29 (×3): 17 g
  Filled 2023-06-26 (×3): qty 1

## 2023-06-26 MED ORDER — STROKE: EARLY STAGES OF RECOVERY BOOK
Freq: Once | Status: AC
  Administered 2023-06-27: 1
  Filled 2023-06-26: qty 1

## 2023-06-26 MED ORDER — INSULIN ASPART 100 UNIT/ML IJ SOLN
0.0000 [IU] | INTRAMUSCULAR | Status: DC
  Administered 2023-06-27 (×2): 2 [IU] via SUBCUTANEOUS
  Administered 2023-06-27 (×2): 3 [IU] via SUBCUTANEOUS
  Administered 2023-06-27 – 2023-06-28 (×4): 2 [IU] via SUBCUTANEOUS
  Administered 2023-06-28: 1 [IU] via SUBCUTANEOUS
  Administered 2023-06-28 – 2023-06-29 (×3): 2 [IU] via SUBCUTANEOUS
  Administered 2023-06-29: 1 [IU] via SUBCUTANEOUS
  Administered 2023-06-29 (×2): 2 [IU] via SUBCUTANEOUS

## 2023-06-26 MED ORDER — ACETAMINOPHEN 650 MG RE SUPP: 650.0000 mg | RECTAL | Status: DC | PRN

## 2023-06-26 MED ORDER — ACETAMINOPHEN 160 MG/5ML PO SOLN: 650.0000 mg | ORAL | Status: DC | PRN

## 2023-06-26 MED ORDER — CLOPIDOGREL BISULFATE 75 MG PO TABS
75.0000 mg | ORAL_TABLET | Freq: Every day | ORAL | Status: DC
  Administered 2023-06-27 – 2023-06-29 (×3): 75 mg
  Filled 2023-06-26 (×3): qty 1

## 2023-06-26 MED ORDER — ASPIRIN 81 MG PO CHEW
CHEWABLE_TABLET | ORAL | Status: AC
Start: 2023-06-26 — End: ?
  Filled 2023-06-26: qty 1

## 2023-06-26 MED ORDER — VERAPAMIL HCL 2.5 MG/ML IV SOLN
INTRAVENOUS | Status: AC | PRN
  Administered 2023-06-26: 2.5 mg via INTRAVENOUS

## 2023-06-26 MED ORDER — FENTANYL CITRATE (PF) 100 MCG/2ML IJ SOLN
INTRAMUSCULAR | Status: AC
  Filled 2023-06-26: qty 2

## 2023-06-26 MED ORDER — DEXAMETHASONE SODIUM PHOSPHATE 10 MG/ML IJ SOLN
INTRAMUSCULAR | Status: DC | PRN
Start: 2023-06-26 — End: 2023-06-26
  Administered 2023-06-26: 5 mg via INTRAVENOUS

## 2023-06-26 MED ORDER — ORAL CARE MOUTH RINSE
15.0000 mL | OROMUCOSAL | Status: DC
  Administered 2023-06-27 – 2023-06-29 (×32): 15 mL via OROMUCOSAL

## 2023-06-26 MED ORDER — CLOPIDOGREL BISULFATE 75 MG PO TABS: 75.0000 mg | ORAL_TABLET | Freq: Every day | ORAL | Status: DC

## 2023-06-26 MED ORDER — ACETAMINOPHEN 160 MG/5ML PO SOLN
650.0000 mg | ORAL | Status: DC | PRN
  Administered 2023-06-28: 650 mg
  Filled 2023-06-26: qty 20.3

## 2023-06-26 MED ORDER — CLEVIDIPINE BUTYRATE 0.5 MG/ML IV EMUL
INTRAVENOUS | Status: AC
  Filled 2023-06-26: qty 50

## 2023-06-26 MED ORDER — PANTOPRAZOLE SODIUM 40 MG IV SOLR
40.0000 mg | Freq: Every day | INTRAVENOUS | Status: DC
  Administered 2023-06-27 – 2023-06-28 (×3): 40 mg via INTRAVENOUS
  Filled 2023-06-26 (×3): qty 10

## 2023-06-26 MED ORDER — VERAPAMIL HCL 2.5 MG/ML IV SOLN
INTRAVENOUS | Status: AC
  Filled 2023-06-26: qty 2

## 2023-06-26 NOTE — Procedures (Signed)
 INR.  Status post right vertebral arteriogram.  Right radial approach.  Findings.  1.  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  Status post contact aspiration x2 using an 055 aspiration catheter, and status post x 2 IntraStent balloon angioplasties of the occluded stent achieving a TICI 1 revascularization with reocclusion. Multiple attempts at crossing the stent unsuccessful due to the hard IntraStent atherosclerotic plaque.  Post CT brain no intracranial hemorrhage.  Patient left intubated due to his medical condition.  Fatima Sanger MD.
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
## 2023-06-26 NOTE — ED Notes (Signed)
 Dr. Charm Barges in triage for evaluation.

## 2023-06-26 NOTE — ED Notes (Signed)
 Dr. Charm Barges called to bedside to clear for CT
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
## 2023-06-26 NOTE — Transfer of Care (Signed)
 Immediate Anesthesia Transfer of Care Note  Patient: Gavin Dorsey  Procedure(s) Performed: RADIOLOGY WITH ANESTHESIA  Patient Location: ICU  Anesthesia Type:General  Level of Consciousness: sedated and Patient remains intubated per anesthesia plan  Airway & Oxygen Therapy: Patient remains intubated per anesthesia plan and Patient placed on Ventilator (see vital sign flow sheet for setting)  Post-op Assessment: Report given to RN and Post -op Vital signs reviewed and stable  Post vital signs: Reviewed and stable  Last Vitals:  Vitals Value Taken Time  BP 147/82   Temp    Pulse 65 06/26/23 2150  Resp 12 06/26/23 2150  SpO2 99 % 06/26/23 2150  Vitals shown include unfiled device data.  Last Pain: There were no vitals filed for this visit.       Complications: No notable events documented.

## 2023-06-26 NOTE — ED Notes (Signed)
 Code Stroke activated Okey Dupre) per verbal order rec'd from [employer], authorized by Dr Charm Barges.

## 2023-06-26 NOTE — Anesthesia Preprocedure Evaluation (Signed)
 Anesthesia Evaluation  Patient identified by MRN, date of birth, ID band Patient awake    Reviewed: Allergy & Precautions, NPO status , Patient's Chart, lab work & pertinent test results, reviewed documented beta blocker date and time Preop documentation limited or incomplete due to emergent nature of procedure.  History of Anesthesia Complications Negative for: history of anesthetic complications  Airway Mallampati: III  TM Distance: >3 FB Neck ROM: Limited  Mouth opening: Limited Mouth Opening  Dental no notable dental hx.    Pulmonary    breath sounds clear to auscultation       Cardiovascular hypertension, +CHF   Rhythm:Regular Rate:Normal  Echo reviewed - WNL   Neuro/Psych Hx CVA and presenting with acute CVA CVA, Residual Symptoms    GI/Hepatic   Endo/Other  diabetes, Type 2    Renal/GU Renal disease     Musculoskeletal   Abdominal   Peds  Hematology   Anesthesia Other Findings   Reproductive/Obstetrics                              Anesthesia Physical Anesthesia Plan  ASA: 3  Anesthesia Plan: General   Post-op Pain Management:    Induction: Intravenous  PONV Risk Score and Plan: 2 and Ondansetron  Airway Management Planned: Video Laryngoscope Planned and Oral ETT  Additional Equipment:   Intra-op Plan:   Post-operative Plan: Extubation in OR  Informed Consent: I have reviewed the patients History and Physical, chart, labs and discussed the procedure including the risks, benefits and alternatives for the proposed anesthesia with the patient or authorized representative who has indicated his/her understanding and acceptance.     Dental advisory given  Plan Discussed with: CRNA  Anesthesia Plan Comments:          Anesthesia Quick Evaluation

## 2023-06-26 NOTE — Anesthesia Procedure Notes (Signed)
 Procedure Name: Intubation Date/Time: 06/26/2023 6:14 PM  Performed by: Mariann Barter, MDPre-anesthesia Checklist: Patient identified, Emergency Drugs available, Suction available and Patient being monitored Patient Re-evaluated:Patient Re-evaluated prior to induction Oxygen Delivery Method: Circle system utilized Preoxygenation: Pre-oxygenation with 100% oxygen Induction Type: IV induction Ventilation: Mask ventilation without difficulty Laryngoscope Size: Glidescope and 4 Grade View: Grade I Tube type: Oral Tube size: 7.5 mm Number of attempts: 2 Airway Equipment and Method: Stylet and Oral airway Placement Confirmation: ETT inserted through vocal cords under direct vision, positive ETCO2 and breath sounds checked- equal and bilateral Secured at: 23 cm Tube secured with: Tape Dental Injury: Teeth and Oropharynx as per pre-operative assessment  Comments: DL x1 by CRNA, patient began to desaturate; DL paused, controlled ventilation given. DL x2 by MDA, grade 1 view with glide 4, ETT gently passed through cords.
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
## 2023-06-26 NOTE — ED Triage Notes (Signed)
 Pt arrived POV after reports of unsteady gait and vision changes. Per friend, pt fell over into some bushes but unsure if he sustained a head injury. Dysarthria noted. Hx of stroke in December, on ASA and Plavix.

## 2023-06-26 NOTE — ED Notes (Signed)
 Patient transported to CT
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
## 2023-06-26 NOTE — Anesthesia Postprocedure Evaluation (Signed)
 Anesthesia Post Note  Patient: Gavin Dorsey  Procedure(s) Performed: RADIOLOGY WITH ANESTHESIA     Patient location during evaluation: SICU Anesthesia Type: General Level of consciousness: sedated Pain management: pain level controlled Vital Signs Assessment: post-procedure vital signs reviewed and stable Respiratory status: patient remains intubated per anesthesia plan Cardiovascular status: stable Postop Assessment: no apparent nausea or vomiting Anesthetic complications: no   No notable events documented.  Last Vitals:  Vitals:   06/26/23 1745 06/26/23 2200  BP: (!) 169/90 118/70  Pulse: 84 68  Resp: 20 18  Temp:    SpO2: 100% 97%    Last Pain: There were no vitals filed for this visit.               Mariann Barter

## 2023-06-26 NOTE — Progress Notes (Signed)
 eLink Physician-Brief Progress Note Patient Name: Gavin Dorsey DOB: 09-10-1960 MRN: [id number]   Date of Service  06/26/2023  HPI/Events of Note  Patient admitted to the hospital as a Code Stroke and underwent IR intervention, he was transferred to the ICU post-procedure intubated and mechanically ventilated.  eICU Interventions  New Patient Evaluation.        Thomasene Lot Arnecia Ector 06/26/2023, 10:14 PM

## 2023-06-27 ENCOUNTER — Inpatient Hospital Stay (HOSPITAL_COMMUNITY)

## 2023-06-27 ENCOUNTER — Encounter (HOSPITAL_COMMUNITY): Payer: Self-pay | Admitting: Radiology

## 2023-06-27 DIAGNOSIS — I6389 Other cerebral infarction: Secondary | ICD-10-CM | POA: Diagnosis not present

## 2023-06-27 DIAGNOSIS — I6302 Cerebral infarction due to thrombosis of basilar artery: Secondary | ICD-10-CM

## 2023-06-27 LAB — CBC WITH DIFFERENTIAL/PLATELET
Abs Immature Granulocytes: 0.03 10*3/uL (ref 0.00–0.07)
Basophils Absolute: 0 10*3/uL (ref 0.0–0.1)
Basophils Relative: 0 %
Eosinophils Absolute: 0 10*3/uL (ref 0.0–0.5)
Eosinophils Relative: 0 %
HCT: 34.3 % — ABNORMAL LOW (ref 39.0–52.0)
Hemoglobin: 10.1 g/dL — ABNORMAL LOW (ref 13.0–17.0)
Immature Granulocytes: 0 %
Lymphocytes Relative: 10 %
Lymphs Abs: 0.7 10*3/uL (ref 0.7–4.0)
MCH: 22.5 pg — ABNORMAL LOW (ref 26.0–34.0)
MCHC: 29.4 g/dL — ABNORMAL LOW (ref 30.0–36.0)
MCV: 76.6 fL — ABNORMAL LOW (ref 80.0–100.0)
Monocytes Absolute: 0.1 10*3/uL (ref 0.1–1.0)
Monocytes Relative: 2 %
Neutro Abs: 6.1 10*3/uL (ref 1.7–7.7)
Neutrophils Relative %: 88 %
Platelets: 340 10*3/uL (ref 150–400)
RBC: 4.48 MIL/uL (ref 4.22–5.81)
RDW: 18 % — ABNORMAL HIGH (ref 11.5–15.5)
WBC: 7 10*3/uL (ref 4.0–10.5)
nRBC: 0 % (ref 0.0–0.2)

## 2023-06-27 LAB — LIPID PANEL
Cholesterol: 180 mg/dL (ref 0–200)
HDL: 35 mg/dL — ABNORMAL LOW (ref >40)
LDL Cholesterol: 107 mg/dL — ABNORMAL HIGH (ref 0–99)
Total CHOL/HDL Ratio: 5.1 ratio
Triglycerides: 191 mg/dL — ABNORMAL HIGH (ref <150)
VLDL: 38 mg/dL (ref 0–40)

## 2023-06-27 LAB — ECHOCARDIOGRAM COMPLETE
AR max vel: 2.71 cm2
AV Peak grad: 11.6 mmHg
Ao pk vel: 1.7 m/s
Area-P 1/2: 2.85 cm2
Height: 68 in
MV VTI: 3.53 cm2
S' Lateral: 2.5 cm
Weight: 3760 [oz_av]

## 2023-06-27 LAB — BASIC METABOLIC PANEL
Anion gap: 9 (ref 5–15)
BUN: 19 mg/dL (ref 8–23)
CO2: 26 mmol/L (ref 22–32)
Calcium: 8.1 mg/dL — ABNORMAL LOW (ref 8.9–10.3)
Chloride: 100 mmol/L (ref 98–111)
Creatinine, Ser: 1.21 mg/dL (ref 0.61–1.24)
GFR, Estimated: >60 mL/min (ref >60)
Glucose, Bld: 220 mg/dL — ABNORMAL HIGH (ref 70–99)
Potassium: 4.1 mmol/L (ref 3.5–5.1)
Sodium: 135 mmol/L (ref 135–145)

## 2023-06-27 LAB — GLUCOSE, CAPILLARY
Glucose-Capillary: 160 mg/dL — ABNORMAL HIGH (ref 70–99)
Glucose-Capillary: 174 mg/dL — ABNORMAL HIGH (ref 70–99)
Glucose-Capillary: 189 mg/dL — ABNORMAL HIGH (ref 70–99)
Glucose-Capillary: 193 mg/dL — ABNORMAL HIGH (ref 70–99)
Glucose-Capillary: 205 mg/dL — ABNORMAL HIGH (ref 70–99)
Glucose-Capillary: 211 mg/dL — ABNORMAL HIGH (ref 70–99)

## 2023-06-27 LAB — MRSA NEXT GEN BY PCR, NASAL: MRSA by PCR Next Gen: NOT DETECTED

## 2023-06-27 LAB — PHOSPHORUS
Phosphorus: 1.6 mg/dL — ABNORMAL LOW (ref 2.5–4.6)
Phosphorus: 3.5 mg/dL (ref 2.5–4.6)

## 2023-06-27 LAB — HEMOGLOBIN A1C
Hgb A1c MFr Bld: 6.1 % — ABNORMAL HIGH (ref 4.8–5.6)
Mean Plasma Glucose: 128.37 mg/dL

## 2023-06-27 LAB — MAGNESIUM: Magnesium: 1.8 mg/dL (ref 1.7–2.4)

## 2023-06-27 MED ORDER — PROSOURCE TF20 ENFIT COMPATIBL EN LIQD
60.0000 mL | Freq: Two times a day (BID) | ENTERAL | Status: DC
  Administered 2023-06-27 – 2023-06-29 (×5): 60 mL
  Filled 2023-06-27 (×5): qty 60

## 2023-06-27 MED ORDER — ROSUVASTATIN CALCIUM 20 MG PO TABS
40.0000 mg | ORAL_TABLET | Freq: Every day | ORAL | Status: DC
  Administered 2023-06-27 – 2023-06-29 (×3): 40 mg
  Filled 2023-06-27 (×3): qty 2

## 2023-06-27 MED ORDER — SODIUM PHOSPHATES 45 MMOLE/15ML IV SOLN
45.0000 mmol | Freq: Once | INTRAVENOUS | Status: AC
  Administered 2023-06-27: 45 mmol via INTRAVENOUS
  Filled 2023-06-27: qty 15

## 2023-06-27 MED ORDER — PERFLUTREN LIPID MICROSPHERE
1.0000 mL | INTRAVENOUS | Status: AC | PRN
  Administered 2023-06-27: 2 mL via INTRAVENOUS

## 2023-06-27 MED ORDER — ENOXAPARIN SODIUM 60 MG/0.6ML IJ SOSY
50.0000 mg | PREFILLED_SYRINGE | INTRAMUSCULAR | Status: DC
  Administered 2023-06-27 – 2023-06-29 (×3): 50 mg via SUBCUTANEOUS
  Filled 2023-06-27 (×3): qty 0.6

## 2023-06-27 MED ORDER — JEVITY 1.5 CAL/FIBER PO LIQD
1000.0000 mL | ORAL | Status: DC
  Administered 2023-06-27 – 2023-06-28 (×2): 1000 mL
  Filled 2023-06-27 (×4): qty 1000

## 2023-06-27 MED ORDER — ADULT MULTIVITAMIN W/MINERALS CH
1.0000 | ORAL_TABLET | Freq: Every day | ORAL | Status: DC
  Administered 2023-06-27 – 2023-06-29 (×3): 1
  Filled 2023-06-27 (×3): qty 1

## 2023-06-27 MED ORDER — MAGNESIUM SULFATE 2 GM/50ML IV SOLN
2.0000 g | Freq: Once | INTRAVENOUS | Status: AC
  Administered 2023-06-27: 2 g via INTRAVENOUS
  Filled 2023-06-27: qty 50

## 2023-06-27 NOTE — Progress Notes (Signed)
 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   Status post contact aspiration x2 using an 055 aspiration catheter, and status post x 2 IntraStent balloon angioplasties of the occluded stent achieving a TICI 1 revascularization with reocclusion. Multiple attempts at crossing the stent unsuccessful due to the hard IntraStent atherosclerotic plaque.  BP 132/71   Pulse 70   Temp 98 F (36.7 C) (Axillary)   Resp 20   Ht 5\' 8"  (1.727 m)   Wt 235 lb (106.6 kg) Comment: from Jan 2025 records  SpO2 99%   BMI 35.73 kg/m  Opens eyes but not following any commands. remains intubated sedated. (R)radial art site soft, NT, no hematoma. Hand warm, brisk cap refill.  NIR following. Reassess later today.  Brayton El PA-C Interventional Radiology 06/27/2023 10:24 AM

## 2023-06-27 NOTE — Progress Notes (Signed)
Pt transported to MRI and back to 4N25 with no complications

## 2023-06-27 NOTE — Plan of Care (Signed)
   Problem: Education: Goal: Knowledge of disease or condition will improve Outcome: Progressing Goal: Knowledge of secondary prevention will improve (MUST DOCUMENT ALL) Outcome: Progressing Goal: Knowledge of patient specific risk factors will improve (DELETE if not current risk factor) Outcome: Progressing   Problem: Ischemic Stroke/TIA Tissue Perfusion: Goal: Complications of ischemic stroke/TIA will be minimized Outcome: Progressing

## 2023-06-27 NOTE — Progress Notes (Addendum)
 Initial Nutrition Assessment  DOCUMENTATION CODES:   Obesity unspecified  INTERVENTION:  Initiate tube feeding via Cortrak: recommend initiating at 40ml/h and advancing 10ml every 8 hours until goal rate of 51ml/h reached  Jevity 1.5 at 55 ml/h (1320 ml per day)  Prosource TF20 60 ml BID  Provides 2140 kcal, 124 gm protein, 1003 ml free water daily  Monitor electrolyte labs daily (baseline phosphorus low)  NUTRITION DIAGNOSIS:  Inadequate oral intake related to inability to eat as evidenced by NPO status.  GOAL:  Patient will meet greater than or equal to 90% of their needs  MONITOR:  Vent status, Labs, I & O's, TF tolerance  REASON FOR ASSESSMENT:  Consult Enteral/tube feeding initiation and management, Assessment of nutrition requirement/status  ASSESSMENT:  Pt with hx of HTN, prior CVA w/ stent placement, and diabetes. Previous admission 03/2023 for stroke during which stent was placed. Admitted for stroke workup after blurred vision and dysarthria.  3/13 admitted for code stroke and code IR; intubated and transferred to 4N; thrombectomy unsuccessful  Pt discussed during ICU rounds and with RN and MD.  Palliative consulted to discuss goals of care with family, pt expected to have significant disabilities after unsuccessful thrombectomy. MD mentioned possible plan for extubation since pt seems to be responsive and showing signs of ability to breath on his own.  Spoke with pt's cousin who was at bedside. Cousin reports pt has family hx of MI and stroke. Pt's dad died when pt was young after a series of strokes. Cousin reports pt had started on a weight loss journey and changed dietary habits within the last few years. Cousin reports pt was eating balanced meals, but unsure how frequently. Cousin reports pt was walking 3-5 miles per day for activity within the last 6 months.  Cousin unsure how much wt pt has lost but noted it to be significant. Per chart review, pt has lost  between 25-30# in the last year and almost 70# since last recorded wt in 2018 prior to 2024. No depletions noted on nutrition focused physical exam, pt appears to be well-nourished at baseline.  Patient is currently intubated on ventilator support MV: 7.3 L/min Temp (24hrs), Avg:97.9 F (36.6 C), Min:97.2 F (36.2 C), Max:98.6 F (37 C)  MAP (3/14): 88 mmHg Propofol: 9.59 ml/hr  Admit weight: 106.6kg  Current weight: 106.6kg  UOP: EBL: 15mL  Drains/Lines: Cortrak NG tube gastric Endotracheal tube Peripheral IV L antecubital Peripheral IV L hand Urinary Catheter  Nutritionally Relevant Medications: Colace SSI Protonix Miralax Magnesium sulfate Sodium phosphate  Labs Reviewed: Phosphorus 1.6 Hgb 10.1 CBG ranges from 116-211 mg/dL over the last 24 hours HgbA1c 6.1  NUTRITION - FOCUSED PHYSICAL EXAM:  Flowsheet Row Most Recent Value  Orbital Region No depletion  Upper Arm Region No depletion  Thoracic and Lumbar Region No depletion  Buccal Region Unable to assess  [vent]  Temple Region No depletion  Clavicle Bone Region No depletion  Clavicle and Acromion Bone Region No depletion  Scapular Bone Region No depletion  Dorsal Hand No depletion  Patellar Region No depletion  Anterior Thigh Region No depletion  Posterior Calf Region No depletion  Edema (RD Assessment) Mild  [BLE]  Hair Reviewed  Eyes Unable to assess  Mouth Unable to assess  Skin Reviewed  Nails Reviewed   Diet Order:   Diet Order             Diet NPO time specified  Diet effective now  EDUCATION NEEDS:  Not appropriate for education at this time  Skin:  Skin Assessment: Reviewed RN Assessment  Last BM:  PTA  Height:  Ht Readings from Last 1 Encounters:  06/26/23 5\' 8"  (1.727 m)   Weight:  Wt Readings from Last 1 Encounters:  06/26/23 106.6 kg   Ideal Body Weight:  70 kg  BMI:  Body mass index is 35.73 kg/m.  Estimated Nutritional Needs:    Kcal:  1950-2150  Protein:  110-125g  Fluid:  >2L  Louis Meckel Dietetic Intern

## 2023-06-27 NOTE — Progress Notes (Addendum)
 PT Cancellation Note  Patient Details Name: Gavin Dorsey MRN: [id number] DOB: 01-23-1961   Cancelled Treatment:    Reason Eval/Treat Not Completed: Active bedrest order remains this morning. Will continue to check in later this morning/afternoon and evaluate as appropriate.   Addendum: 13:13 PM: RN reports pt remains on significant sedation, hoping to wean from sedation prior to PT evaluation. Will check back in AM.   Vickki Muff, PT, DPT   Acute Rehabilitation Department Office [phone] Secure Chat Communication Preferred   Ronnie Derby 06/27/2023, 8:14 AM

## 2023-06-27 NOTE — Progress Notes (Signed)
 OT Cancellation Note  Patient Details Name: Gavin Dorsey MRN: [id number] DOB: 28-May-1960   Cancelled Treatment:    Reason Eval/Treat Not Completed: Active bedrest order  Mateo Flow 06/27/2023, 8:21 AM

## 2023-06-27 NOTE — Progress Notes (Addendum)
 STROKE TEAM PROGRESS NOTE    SIGNIFICANT HOSPITAL EVENTS 3/13-patient admitted with unsteady gait and visual changes, found to have repeat terminal right vertebral artery and basilar artery stent occlusion, thrombectomy attempted but was unsuccessful.  Patient remains intubated  INTERIM HISTORY/SUBJECTIVE MRI scan of the brain shows bilateral cerebellar and pontine left greater than right as well as right occipital infarcts. Patient has remained hemodynamically stable and afebrile overnight.  He was on sedation with low-dose propofol in the morning, but after sedation was weaned, he became able to move eyes vertically but not horizontally and give a thumbs up on the left and squeeze my hand.. Blood pressure adequately controlled.  Vital signs stable OBJECTIVE  CBC    Component Value Date/Time   WBC 7.0 06/27/2023 0527   RBC 4.48 06/27/2023 0527   HGB 10.1 (L) 06/27/2023 0527   HGB 15.9 07/27/2011 1426   HCT 34.3 (L) 06/27/2023 0527   HCT 54.3 (H) 03/06/2023 1449   PLT 340 06/27/2023 0527   PLT 240 07/27/2011 1426   MCV 76.6 (L) 06/27/2023 0527   MCV 88 07/27/2011 1426   MCH 22.5 (L) 06/27/2023 0527   MCHC 29.4 (L) 06/27/2023 0527   RDW 18.0 (H) 06/27/2023 0527   RDW 13.5 07/27/2011 1426   LYMPHSABS 0.7 06/27/2023 0527   MONOABS 0.1 06/27/2023 0527   EOSABS 0.0 06/27/2023 0527   BASOSABS 0.0 06/27/2023 0527    BMET    Component Value Date/Time   NA 135 06/27/2023 0527   NA 139 07/27/2011 1426   K 4.1 06/27/2023 0527   K 3.6 07/27/2011 1426   CL 100 06/27/2023 0527   CL 104 07/27/2011 1426   CO2 26 06/27/2023 0527   CO2 27 07/27/2011 1426   GLUCOSE 220 (H) 06/27/2023 0527   GLUCOSE 123 (H) 07/27/2011 1426   BUN 19 06/27/2023 0527   BUN 17 07/27/2011 1426   CREATININE 1.21 06/27/2023 0527   CREATININE 1.27 07/27/2011 1426   CALCIUM 8.1 (L) 06/27/2023 0527   CALCIUM 8.3 (L) 07/27/2011 1426   GFRNONAA >60 06/27/2023 0527   GFRNONAA >60 07/27/2011 1426    IMAGING  past 24 hours ECHOCARDIOGRAM COMPLETE Result Date: 06/27/2023    ECHOCARDIOGRAM REPORT   Patient Name:   Gavin Dorsey Date of Exam: 06/27/2023 Medical Rec #:  [id number]      Height:       68.0 in Accession #:    [id number]     Weight:       235.0 lb Date of Birth:  [birth year]      BSA:          2.189 m Patient Age:    62 years       BP:           122/70 mmHg Patient Gender: M              HR:           69 bpm. Exam Location:  Inpatient Procedure: 2D Echo, Intracardiac Opacification Agent, Color Doppler and Cardiac            Doppler (Both Spectral and Color Flow Doppler were utilized during            procedure). Indications:    Stroke  History:        Patient has prior history of Echocardiogram examinations.                 Stroke; Risk Factors:Hypertension.  Sonographer:  Lamont Snowball Referring Phys: [phone] DENISE A WOLFE IMPRESSIONS  1. Left ventricular ejection fraction, by estimation, is 60 to 65%. The left ventricle has normal function. The left ventricle has no regional wall motion abnormalities. There is moderate concentric left ventricular hypertrophy. Left ventricular diastolic parameters are consistent with Grade I diastolic dysfunction (impaired relaxation).  2. Right ventricular systolic function is normal. The right ventricular size is normal. Tricuspid regurgitation signal is inadequate for assessing PA pressure.  3. The mitral valve is normal in structure. No evidence of mitral valve regurgitation. No evidence of mitral stenosis.  4. The aortic valve is tricuspid. There is mild calcification of the aortic valve. Aortic valve regurgitation is not visualized. No aortic stenosis is present.  5. Aortic dilatation noted. There is borderline dilatation of the aortic root, measuring 39 mm. There is mild dilatation of the ascending aorta, measuring 42 mm.  6. The inferior vena cava is dilated in size with <50% respiratory variability, suggesting right atrial pressure of 15 mmHg. FINDINGS  Left  Ventricle: Left ventricular ejection fraction, by estimation, is 60 to 65%. The left ventricle has normal function. The left ventricle has no regional wall motion abnormalities. The left ventricular internal cavity size was normal in size. There is  moderate concentric left ventricular hypertrophy. Left ventricular diastolic parameters are consistent with Grade I diastolic dysfunction (impaired relaxation). Right Ventricle: The right ventricular size is normal. No increase in right ventricular wall thickness. Right ventricular systolic function is normal. Tricuspid regurgitation signal is inadequate for assessing PA pressure. Left Atrium: Left atrial size was normal in size. Right Atrium: Right atrial size was normal in size. Pericardium: There is no evidence of pericardial effusion. Mitral Valve: The mitral valve is normal in structure. No evidence of mitral valve regurgitation. No evidence of mitral valve stenosis. MV peak gradient, 2.6 mmHg. The mean mitral valve gradient is 2.0 mmHg. Tricuspid Valve: The tricuspid valve is normal in structure. Tricuspid valve regurgitation is trivial. No evidence of tricuspid stenosis. Aortic Valve: The aortic valve is tricuspid. There is mild calcification of the aortic valve. Aortic valve regurgitation is not visualized. No aortic stenosis is present. Aortic valve peak gradient measures 11.6 mmHg. Pulmonic Valve: The pulmonic valve was normal in structure. Pulmonic valve regurgitation is not visualized. No evidence of pulmonic stenosis. Aorta: Aortic dilatation noted. There is borderline dilatation of the aortic root, measuring 39 mm. There is mild dilatation of the ascending aorta, measuring 42 mm. Venous: The inferior vena cava is dilated in size with less than 50% respiratory variability, suggesting right atrial pressure of 15 mmHg. IAS/Shunts: No atrial level shunt detected by color flow Doppler.  LEFT VENTRICLE PLAX 2D LVIDd:         4.70 cm   Diastology LVIDs:          2.50 cm   LV e' medial:    5.11 cm/s LV PW:         1.10 cm   LV E/e' medial:  15.3 LV IVS:        1.40 cm   LV e' lateral:   10.10 cm/s LVOT diam:     2.20 cm   LV E/e' lateral: 7.7 LV SV:         86 LV SV Index:   39 LVOT Area:     3.80 cm  RIGHT VENTRICLE             IVC RV Basal diam:  4.40 cm     IVC  diam: 2.40 cm RV S prime:     14.90 cm/s TAPSE (M-mode): 1.8 cm LEFT ATRIUM             Index        RIGHT ATRIUM           Index LA Vol (A2C):   39.3 ml 17.96 ml/m  RA Area:     28.90 cm LA Vol (A4C):   52.5 ml 23.99 ml/m  RA Volume:   112.00 ml 51.17 ml/m LA Biplane Vol: 46.1 ml 21.06 ml/m  AORTIC VALVE AV Area (Vmax): 2.71 cm AV Vmax:        170.00 cm/s AV Peak Grad:   11.6 mmHg LVOT Vmax:      121.00 cm/s LVOT Vmean:     77.500 cm/s LVOT VTI:       0.226 m  AORTA Ao Root diam: 3.90 cm Ao Asc diam:  4.20 cm MITRAL VALVE MV Area (PHT): 2.85 cm    SHUNTS MV Area VTI:   3.53 cm    Systemic VTI:  0.23 m MV Peak grad:  2.6 mmHg    Systemic Diam: 2.20 cm MV Mean grad:  2.0 mmHg MV Vmax:       0.81 m/s MV Vmean:      59.7 cm/s MV Decel Time: 266 msec MV E velocity: 78.00 cm/s MV A velocity: 82.70 cm/s MV E/A ratio:  0.94 Arvilla Meres MD Electronically signed by Arvilla Meres MD Signature Date/Time: 06/27/2023/10:34:20 AM    Final    DG Abd Portable 1V Result Date: 06/27/2023 CLINICAL DATA:  Nasogastric tube placement. EXAM: PORTABLE ABDOMEN - 1 VIEW COMPARISON:  None Available. FINDINGS: Distal tip of nasogastric tube is seen in expected position of proximal stomach. IMPRESSION: Distal tip of nasogastric tube seen in expected position of proximal stomach. Electronically Signed   By: Lupita Raider M.D.   On: 06/27/2023 10:14   MR BRAIN WO CONTRAST Result Date: 06/27/2023 CLINICAL DATA:  Stroke follow-up EXAM: MRI HEAD WITHOUT CONTRAST TECHNIQUE: Multiplanar, multiecho pulse sequences of the brain and surrounding structures were obtained without intravenous contrast. COMPARISON:  04/11/2023,  06/26/2022 FINDINGS: Brain: There are acute/early subacute infarcts of both cerebellar hemispheres, both pontine hemispheres and the right occipital lobe. Chronic blood products at the right cerebral peduncle and the left occipital lobe. Normal volume of CSF spaces. There is multifocal hyperintense T2-weighted signal within the periventricular and deep white matter. No chronic microhemorrhage. Normal midline structures. Old right MCA territory infarct. Vascular: Loss of the normal flow void of the left V4 segment and the basilar artery. Skull and upper cervical spine: Normal calvarium and skull base. Visualized upper cervical spine and soft tissues are normal. Sinuses/Orbits:No paranasal sinus fluid levels or advanced mucosal thickening. No mastoid or middle ear effusion. Normal orbits. IMPRESSION: 1. 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 2. Loss of the normal flow void of the left V4 segment and the basilar artery, consistent with occlusion. Electronically Signed   By: Deatra Robinson M.D.   On: 06/27/2023 04:07   Portable Chest x-ray Result Date: 06/27/2023 CLINICAL DATA:  Intubated EXAM: PORTABLE CHEST 1 VIEW COMPARISON:  07/08/2016 FINDINGS: Endotracheal tube tip is about 3.5 cm superior to the carina. Low lung volumes. Mild cardiomegaly. No pleural effusion or pneumothorax IMPRESSION: Endotracheal tube tip about 3.5 cm superior to the carina. Low lung volumes. Mild cardiomegaly. Electronically Signed   By: Adrian Prows.D.  On: 06/27/2023 00:03   CT ANGIO HEAD NECK W WO CM (CODE STROKE) Result Date: 06/26/2023 CLINICAL DATA:  Code stroke, right arm weakness, gait instability, vision changes. EXAM: CT ANGIOGRAPHY HEAD AND NECK WITH AND WITHOUT CONTRAST TECHNIQUE: Multidetector CT imaging of the head and neck was performed using the standard protocol during bolus administration of intravenous contrast. Multiplanar CT image  reconstructions and MIPs were obtained to evaluate the vascular anatomy. Carotid stenosis measurements (when applicable) are obtained utilizing NASCET criteria, using the distal internal carotid diameter as the denominator. RADIATION DOSE REDUCTION: This exam was performed according to the departmental dose-optimization program which includes automated exposure control, adjustment of the mA and/or kV according to patient size and/or use of iterative reconstruction technique. CONTRAST:  85mL OMNIPAQUE IOHEXOL 350 MG/ML SOLN COMPARISON:  Same-day head CT.  CTA head and neck 04/01/2023. FINDINGS: CTA NECK FINDINGS Aortic arch: Standard configuration of the aortic arch. Imaged portion shows no evidence of aneurysm or dissection. No significant stenosis of the major arch vessel origins. Pulmonary arteries: As permitted by contrast timing, there are no filling defects in the visualized pulmonary arteries. Subclavian arteries: The subclavian arteries are patent bilaterally. Right carotid system: Patent. Noncalcified atherosclerotic plaque in the distal common carotid artery extending to the bifurcation and proximal cervical ICA. Mild atherosclerosis at the carotid bifurcation without hemodynamically significant stenosis. No evidence of dissection. Tortuosity of the mid and distal cervical ICA. Left carotid system: No evidence of dissection, stenosis (50% or greater), or occlusion. Minimal atherosclerosis at the carotid bifurcation. Mild tortuosity of the distal cervical ICA. Vertebral arteries: The right vertebral artery is patent from its origin to the proximal V4 segment. Atherosclerosis along the V4 segment resulting in moderate stenosis similar to prior. There is occlusion of the distal right V4 segment with suggestion of possible intraluminal soft tissue concerning for intraluminal thrombus. The left vertebral artery is occluded from its origin to the intracranial segment which is similar to prior. Skeleton: No acute  findings. Degenerative changes in the cervical spine. Prominent anterior endplate osteophytes at multiple levels in the cervical spine. Other neck: The visualized airway is patent. No cervical lymphadenopathy. Upper chest: Visualized lung apices are clear. Review of the MIP images confirms the above findings CTA HEAD FINDINGS ANTERIOR CIRCULATION: The intracranial ICAs are patent bilaterally. Atherosclerosis of the bilateral carotid siphons. Similar moderate stenosis of the right supraclinoid ICA. Mild stenosis of the left supraclinoid ICA. No proximal occlusion, aneurysm, or vascular malformation. MCAs: The middle cerebral arteries are patent bilaterally. ACAs: The anterior cerebral arteries are patent bilaterally. POSTERIOR CIRCULATION: PCAs: The posterior cerebral arteries are patent bilaterally. Pcomm: Small posterior communicating artery visualized on the left. SCAs: Patent bilaterally. There is focal severe stenosis of the distal left superior cerebellar artery. Thrombus within the basilar artery possibly involves the origin of the left superior cerebellar artery. Basilar artery: The proximal basilar artery is occluded. Stent noted within the basilar artery which may be occluded as well. Irregular soft tissue within the mid and distal basilar artery along the left lateral wall concerning for mural a shin or thrombus. Soft tissue extends superiorly to the level of the superior cerebellar arteries. AICAs: Not well visualized. PICAs: Visualized on the right. Diminutive left PICA branch is noted. Venous sinuses: As permitted by contrast timing, patent. Anatomic variants: None Review of the MIP images confirms the above findings IMPRESSION: 1. 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 2. Occlusion  of the proximal basilar artery. Stent within the basilar artery is likely occluded. Irregular soft tissue within the mid  and distal basilar artery along the left lateral wall concerning for mural thrombus. Soft tissue extends superiorly to the level of the superior cerebellar arteries. 3. Thrombus likely involves the origin of the left superior cerebellar artery. 4. 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 5. Occlusion of the left vertebral artery from its origin to the intracranial segment, similar to prior. 6. Multifocal atherosclerosis as above. Moderate stenosis of the right supraclinoid ICA again noted. These results were communicated to Dr. Selina Cooley At 5:40 Pm on 06/26/2023 by phone call. Electronically Signed   By: Emily Filbert M.D.   On: 06/26/2023 18:24   CT HEAD CODE STROKE WO CONTRAST Result Date: 06/26/2023 CLINICAL DATA:  Code stroke. Code stroke, right arm weakness, history of prior basilar artery stent. Unsteady gait and vision changes. EXAM: CT HEAD WITHOUT CONTRAST TECHNIQUE: Contiguous axial images were obtained from the base of the skull through the vertex without intravenous contrast. RADIATION DOSE REDUCTION: This exam was performed according to the departmental dose-optimization program which includes automated exposure control, adjustment of the mA and/or kV according to patient size and/or use of iterative reconstruction technique. COMPARISON:  CT head 04/11/2023. FINDINGS: Brain: No acute intracranial hemorrhage. Hypoattenuation in the left cerebellum within the superior cerebellar artery territory concerning for acute versus subacute infarct. There are no additional areas concerning for infarct in the supratentorial parenchyma. No edema, mass effect, or midline shift. The basilar cisterns are patent. Ventricles: The ventricles are normal. Vascular: Atherosclerotic calcifications of the carotid siphons and intracranial vertebral arteries. No hyperdense vessel. Skull: No acute or aggressive finding. Orbits: Orbits are symmetric. Sinuses: The visualized paranasal sinuses are clear. Other:  Mastoid air cells are clear. ASPECTS [facility] Stroke Program Early CT Score) - Ganglionic level infarction (caudate, lentiform nuclei, internal capsule, insula, M1-M3 cortex): 7 - Supraganglionic infarction (M4-M6 cortex): 3 Total score (0-10 with 10 being normal): 10 IMPRESSION: 1. Hypoattenuation in the left cerebellum within the superior cerebellar artery territory concerning for acute versus subacute infarct. 2. No acute intracranial hemorrhage. 3. Aspects is 10. These results were communicated to Dr. Selina Cooley At 5:55 pm on 06/26/2023 by text page via the [facility] messaging system. Electronically Signed   By: Emily Filbert M.D.   On: 06/26/2023 17:56    Vitals:   06/27/23 0900 06/27/23 1108 06/27/23 1109 06/27/23 1114  BP: 132/71     Pulse: 70 71    Resp: 20 20    Temp:      TempSrc:      SpO2: 99% 100% 100% 100%  Weight:      Height:         PHYSICAL EXAM General: Intubated patient in no acute distress Psych:  Mood and affect appropriate for situation CV: Regular rate and rhythm on monitor Respiratory: Respirations synchronous with ventilator GI: Abdomen soft and nontender   NEURO:  Mental Status (sedated with low-dose propofol): Pupils equal round and reactive to light, oculocephalic reflex absent, cough and gag present, will slightly flicker right upper and lower extremity as well as left upper extremity to noxious, does not move left lower extremity  Most Recent NIH  1a Level of Conscious.: 2 1b LOC Questions: 2 1c LOC Commands: 2 2 Best Gaze: 2 3 Visual: 0 4 Facial Palsy: 0 5a Motor Arm - left: 3 5b Motor Arm - Right: 3 6a Motor Leg - Left: 4  6b Motor Leg - Right: 3 7 Limb Ataxia: 0 8 Sensory: 0 9 Best Language: 3 10 Dysarthria: 2 11 Extinct. and Inatten.: 0 TOTAL: 26   ASSESSMENT/PLAN  Gavin Dorsey is a 63 y.o. male with history of stroke with basilar stent placement, hypertension, diabetes and obesity who presented for evaluation of unsteady gait and vision  changes.  Initial CT revealed hypoattenuation in the left cerebellum concerning for stroke, and CT angiogram revealed repeat basilar artery occlusion.  Patient was taken to interventional radiology for thrombectomy, but this was unsuccessful.  He remains intubated after the procedure.  MRI reveals infarcts in bilateral cerebellar hemispheres, bilateral pontine hemispheres and in right occipital lobe.  Patient will likely have significant disability after these infarcts, so we will consult palliative care for discussion of goals of care with family.  NIH on Admission 5  Acute Ischemic Infarct: Infarcts in both cerebellar hemispheres, both pontine hemispheres and right occipital lobe Etiology: Reocclusion of dominant right vertebral artery and basilar artery stent   Code Stroke CT head hypoattenuation in left cerebellar hemisphere in SCA territory ASPECTS 10.    CTA head & neck occlusion of distal right V4 segment, occlusion of proximal basilar artery with occlusion of stent, thrombus likely involving origin of left superior cerebellar artery, severe stenosis of distal left superior cerebellar artery, occlusion of left vertebral artery MRI acute infarcts of bilateral cerebellar hemispheres, both pontine hemispheres and right occipital lobe 2D Echo EF 60 to 65%, mild concentric LVH, grade 1 diastolic dysfunction, aortic dilatation, normal left atrial size, no atrial level shunt LDL 107 HgbA1c 6.1 VTE prophylaxis -Lovenox aspirin 81 mg daily and clopidogrel 75 mg daily prior to admission, now on aspirin 81 mg daily and clopidogrel 75 mg daily  Therapy recommendations:  Pending Disposition: Pending  Hx of Stroke/TIA, basilar stenosis Patient had scattered left cerebellar and brainstem infarcts in December 2024 due to stenosis of basilar artery, stent was placed Patient was found to have small hemorrhage of right cerebral peduncle after the stroke.   Hypertension Home meds: None Stable Blood Pressure  Goal: SBP 120-160 for first 24 hours then less than 180   Hyperlipidemia Home meds: Rosuvastatin 40 mg daily, resumed in hospital LDL 107, goal < 70 Will consider addition of Leqvio or PCSK9 inhibitor Continue statin at discharge  Diabetes type II Controlled Home meds: None HgbA1c 6.1, goal < 7.0 CBGs SSI Recommend close follow-up with PCP for better DM control  Respiratory failure Patient left intubated after attempted thrombectomy Ventilator management per CCM Extubate when able  Dysphagia Patient has post-stroke dysphagia, SLP consulted    Diet   Diet NPO time specified   Advance diet as tolerated  Other Stroke Risk Factors Obesity, Body mass index is 35.73 kg/m., BMI >/= 30 associated with increased stroke risk, recommend weight loss, diet and exercise as appropriate    Other Active Problems None  Hospital day # 1  Patient seen by NP with MD, MD to edit note as needed. Cortney E Ernestina Columbia , MSN, AGACNP-BC [facility] See Amion for schedule and pager information 06/27/2023 12:03 PM   I have personally obtained history,examined this patient, reviewed notes, independently viewed imaging studies, participated in medical decision making and plan of care.ROS completed by me personally and pertinent positives fully documented  I have made any additions or clarifications directly to the above note. Agree with note above.  Unfortunate patient who previously had bilateral cerebral and brainstem infarcts due to terminal  right vertebral and proximal basilar artery occlusion in December 2024 for which he underwent elective angioplasty stenting and was doing well unfortunately developed reocclusion of the stent and attempts at revascularization were unsuccessful.  MRI scan shows large left pontine and bilateral cerebellar, right frontal and right occipital infarcts.  He will likely have significant neurological disability and may require prolonged ventilatory support,  tracheostomy, PEG tube and nursing home care.  Long discussion with patient's cousin and sister at the bedside and answered questions about his prognosis and family needs time to make decisions about goals of care.  Continue ventilatory support but wean as tolerated per critical care team.  Strict control of blood pressure and close neurological monitoring as per post intervention protocol.  Continue aspirin and Plavix.  Discussed with Dr. Isaiah Serge critical care medicine.  Discussed with Dr. Corliss Skains neurointerventional radiology.  This patient is critically ill and at significant risk of neurological worsening, death and care requires constant monitoring of vital signs, hemodynamics,respiratory and cardiac monitoring, extensive review of multiple databases, frequent neurological assessment, discussion with family, other specialists and medical decision making of high complexity.I have made any additions or clarifications directly to the above note.This critical care time does not reflect procedure time, or teaching time or supervisory time of PA/NP/Med Resident etc but could involve care discussion time.  I spent 30 minutes of neurocritical care time  in the care of  this patient.      Delia Heady, MD Medical Director [facility] Pager: [phone] 06/27/2023 3:37 PM  To contact Stroke Continuity provider, please refer to [email or web address]. After hours, contact General Neurology

## 2023-06-27 NOTE — Progress Notes (Signed)
 Pharmacy Electrolyte Replacement  Recent Labs:  Recent Labs    06/27/23 0527  K 4.1  MG 1.8  PHOS 1.6*  CREATININE 1.21    Low Critical Values (K </= 2.5, Phos </= 1, Mg </= 1) Present: NA  MD Contacted: NA  Plan:  IV NaPhos x1 2g Mg IV x1  Stephenie Acres, PharmD PGY1 Pharmacy Resident 06/27/2023 8:51 AM

## 2023-06-27 NOTE — Progress Notes (Signed)
 Patient placed back on full support due to respiratory rate in the upper 20's and to rest overnight. No respiratory distress noted at this time.    06/27/23 2002  Vent Select  Invasive or Noninvasive Invasive  Adult Vent Y  Airway 7.5 mm  Placement Date/Time: 06/26/23 (c) 1814   Grade View: Grade 1  Airway Device: Endotracheal Tube  Laryngoscope Blade: 4  ETT Types: Oral  Size (mm): 7.5 mm  Cuffed: Min.occ.pres.  Insertion attempts: 2  Airway Equipment: Stylet  Placement Confirmation: ...  Secured at (cm) 24 cm  Measured From Lips  Secured Location Center  Secured By English as a second language teacher No  Tube Holder Repositioned Yes  Prone position No  Cuff Pressure (cm H2O) Green OR 18-26 CmH2O  Site Condition Dry  Adult Ventilator Settings  Vent Type Servo i  Humidity HME  Vent Mode (S)  PRVC  Vt Set 550 mL  Set Rate 20 bmp  FiO2 (%) 40 %  I Time 0.9 Sec(s)  PEEP 5 cmH20  Adult Ventilator Measurements  Peak Airway Pressure 19 L/min  Mean Airway Pressure 9 cmH20  Plateau Pressure 15 cmH20  Resp Rate Total 22 br/min  Exhaled Vt 545 mL  Measured Ve 11.9 L  I:E Ratio Measured 1:2.0  SpO2 98 %  Adult Ventilator Alarms  Alarms On Y  Ve High Alarm 20 L/min  Ve Low Alarm 5 L/min  Resp Rate High Alarm 38 br/min  Resp Rate Low Alarm 10  PEEP Low Alarm 3 cmH2O  Press High Alarm 40 cmH2O  VAP Prevention  HOB> 30 Degrees Y  Breath Sounds  Bilateral Breath Sounds Clear;Diminished  Vent Respiratory Assessment  Respiratory Pattern Regular;Unlabored  Suction Method  Respiratory Interventions Airway suction;Oral suction  Oral Suctioning/Secretions  Suction Type Oral  Suction Device Yankauer  Secretion Amount Small  Secretion Color Clear  Secretion Consistency Thin  Suction Tolerance Tolerated well  Suctioning Adverse Effects None  Airway Suctioning/Secretions  Suction Type ETT  Suction Device  Catheter  Secretion Amount Small  Secretion Color White;Tan  Secretion  Consistency Thick  Suction Tolerance Tolerated well  Suctioning Adverse Effects None

## 2023-06-27 NOTE — Progress Notes (Addendum)
 Pt's family and close friend are requesting to meet with the MD team and Pallative Care 06/28/23 at 10 am in hopes of going towards the pallative approach. They agree to wait until sedation is off, allowing pt to have a input considering he is following commands. Dr. Pearlean Brownie informed. With the request of Dr. Pearlean Brownie, Pallative Cx informed of this note.

## 2023-06-27 NOTE — Progress Notes (Signed)
 SLP Cancellation Note  Patient Details Name: Gavin Dorsey MRN: [id number] DOB: 04/30/1960   Cancelled treatment:       Reason Eval/Treat Not Completed: Patient not medically ready (on vent, sedated). Will continue to follow.    Mahala Menghini., M.A. CCC-SLP Acute Rehabilitation Services Office [phone]  Secure chat preferred  06/27/2023, 10:32 AM

## 2023-06-27 NOTE — Plan of Care (Signed)
  Problem: Education: Goal: Knowledge of disease or condition will improve 06/27/2023 1503 by Ronalee Red, RN Outcome: Progressing 06/27/2023 1232 by Ronalee Red, RN Outcome: Progressing Goal: Knowledge of secondary prevention will improve (MUST DOCUMENT ALL) 06/27/2023 1503 by Ronalee Red, RN Outcome: Progressing 06/27/2023 1232 by Ronalee Red, RN Outcome: Progressing Goal: Knowledge of patient specific risk factors will improve (DELETE if not current risk factor) Outcome: Progressing   Problem: Ischemic Stroke/TIA Tissue Perfusion: Goal: Complications of ischemic stroke/TIA will be minimized 06/27/2023 1503 by Ronalee Red, RN Outcome: Progressing 06/27/2023 1232 by Ronalee Red, RN Outcome: Progressing

## 2023-06-27 NOTE — Procedures (Signed)
 Cortrak  Person Inserting Tube:  Violette Morneault T, RD Tube Type:  Cortrak - 43 inches Tube Size:  10 Tube Location:  Right nare Initial Placement:  Stomach Secured by: Bridle Technique Used to Measure Tube Placement:  Marking at nare/corner of mouth Cortrak Secured At:  67 cm   Cortrak Tube Team Note:  Consult received to place a Cortrak feeding tube.   No x-ray is required. RN may begin using tube.   If the tube becomes dislodged please keep the tube and contact the Cortrak team at [email or web address] for replacement.  If after hours and replacement cannot be delayed, place a NG tube and confirm placement with an abdominal x-ray.    Shelle Iron RD, LDN Contact via [employer].

## 2023-06-28 DIAGNOSIS — R1310 Dysphagia, unspecified: Secondary | ICD-10-CM

## 2023-06-28 DIAGNOSIS — E785 Hyperlipidemia, unspecified: Secondary | ICD-10-CM | POA: Diagnosis not present

## 2023-06-28 DIAGNOSIS — I63213 Cerebral infarction due to unspecified occlusion or stenosis of bilateral vertebral arteries: Secondary | ICD-10-CM | POA: Diagnosis not present

## 2023-06-28 DIAGNOSIS — E119 Type 2 diabetes mellitus without complications: Secondary | ICD-10-CM | POA: Diagnosis not present

## 2023-06-28 DIAGNOSIS — I6389 Other cerebral infarction: Secondary | ICD-10-CM | POA: Diagnosis not present

## 2023-06-28 LAB — GLUCOSE, CAPILLARY
Glucose-Capillary: 168 mg/dL — ABNORMAL HIGH (ref 70–99)
Glucose-Capillary: 174 mg/dL — ABNORMAL HIGH (ref 70–99)
Glucose-Capillary: 150 mg/dL — ABNORMAL HIGH (ref 70–99)
Glucose-Capillary: 157 mg/dL — ABNORMAL HIGH (ref 70–99)
Glucose-Capillary: 151 mg/dL — ABNORMAL HIGH (ref 70–99)
Glucose-Capillary: 164 mg/dL — ABNORMAL HIGH (ref 70–99)

## 2023-06-28 LAB — BASIC METABOLIC PANEL
Anion gap: 6 (ref 5–15)
BUN: 25 mg/dL — ABNORMAL HIGH (ref 8–23)
CO2: 27 mmol/L (ref 22–32)
Calcium: 7.4 mg/dL — ABNORMAL LOW (ref 8.9–10.3)
Chloride: 105 mmol/L (ref 98–111)
Creatinine, Ser: 1.17 mg/dL (ref 0.61–1.24)
GFR, Estimated: >60 mL/min (ref >60)
Glucose, Bld: 152 mg/dL — ABNORMAL HIGH (ref 70–99)
Potassium: 3.3 mmol/L — ABNORMAL LOW (ref 3.5–5.1)
Sodium: 138 mmol/L (ref 135–145)

## 2023-06-28 LAB — CBC
HCT: 29.9 % — ABNORMAL LOW (ref 39.0–52.0)
Hemoglobin: 9 g/dL — ABNORMAL LOW (ref 13.0–17.0)
MCH: 22.4 pg — ABNORMAL LOW (ref 26.0–34.0)
MCHC: 30.1 g/dL (ref 30.0–36.0)
MCV: 74.4 fL — ABNORMAL LOW (ref 80.0–100.0)
Platelets: 314 10*3/uL (ref 150–400)
RBC: 4.02 MIL/uL — ABNORMAL LOW (ref 4.22–5.81)
RDW: 18.6 % — ABNORMAL HIGH (ref 11.5–15.5)
WBC: 10.1 10*3/uL (ref 4.0–10.5)
nRBC: 0 % (ref 0.0–0.2)

## 2023-06-28 LAB — PHOSPHORUS: Phosphorus: 2.7 mg/dL (ref 2.5–4.6)

## 2023-06-28 LAB — MAGNESIUM: Magnesium: 2.2 mg/dL (ref 1.7–2.4)

## 2023-06-28 LAB — PLATELET INHIBITION P2Y12: Platelet Function  P2Y12: 257 [PRU] (ref 182–335)

## 2023-06-28 MED ORDER — POTASSIUM CHLORIDE 20 MEQ PO PACK
40.0000 meq | PACK | Freq: Once | ORAL | Status: AC
  Administered 2023-06-28: 40 meq
  Filled 2023-06-28: qty 2

## 2023-06-28 NOTE — Plan of Care (Signed)
  Problem: Ischemic Stroke/TIA Tissue Perfusion: Goal: Complications of ischemic stroke/TIA will be minimized Outcome: Progressing   Problem: Nutrition: Goal: Dietary intake will improve Outcome: Progressing   Problem: Nutrition: Goal: Adequate nutrition will be maintained Outcome: Progressing

## 2023-06-28 NOTE — Progress Notes (Signed)
 Chaplain responded to a request from the nurse to visit Pt and offer some prayer time with him. Nurse explained this is a request coming from the family and a good friend of Pt. When Chaplain arrived, family was not at bedside. Chaplain had a moment to talk and offer compassionate and silent presence to Pt, praying with him. Nurse asked Chaplain if someone from this office could come back tomorrow, Sunday afternoon, for another visit, while family is with Pt. Chaplain will coordinate to have a Chaplain present tomorrow with Pt and family. Nurse was grateful for Chaplain's visit.  Oneida Alar Chaplain Resident   06/28/23 1642  Spiritual Encounters  Type of Visit Initial  Care provided to: Patient  Conversation partners present during encounter Nurse  Referral source Clinical staff  Reason for visit Routine spiritual support  OnCall Visit Yes  Spiritual Framework  Presenting Themes Courage hope and growth;Meaning/purpose/sources of inspiration  Community/Connection Family  Patient Stress Factors None identified  Family Stress Factors None identified  Interventions  Spiritual Care Interventions Made Compassionate presence

## 2023-06-28 NOTE — Consult Note (Signed)
 NAME:  Gavin Dorsey, MRN:  [id number], DOB:  11/01/1960, LOS: 2 ADMISSION DATE:  06/26/2023, CONSULTATION DATE: 06/26/23 REFERRING MD:  Corliss Skains  CHIEF COMPLAINT: Right side weakness and dysarthria-Code Stroke  History of Present Illness:  Pt is a 63 yr old male with significant past medical hx of prior CVA s/p stent placement on DAPT (ASA and Plavix- per documentation last dose taken on 06/25/23), HTN, DM, obesity who presented to [facility] ED for changes in vision and dysarthria, last known well was at 1400 on 3/13. Patient was with friend at the time of onset, and apparently fell over into nearby bushes but not sure if injury to head was sustained. Attempted to drive to hospital but unable to correctively maintain vehicle on road. Friend stopped patient in car and drove patient to [facility] ED. Code stroke initiated, and STAT CT was obtained. CT of head noted hypoattentuation in left cerebellum with superior cerebellar artery territory, concerns for acute vs subacute infarct but no acute intracranial hemorrhage. CT angio revealed an occlusion of the right vertebral artery-distal V4 segment, occlusion of the proximal basilar artery, previous stent in basilar artery appearing occluded as well, multifocal atherosclerosis and moderate stenosis of the right supraclinoid ICA. NIHSS 5 originally progressed to 14 with new onset of facial droop, increased dysarthria, RUE/RLE weakness. Patient was taken to IR STAT for attempt in revascularization. 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 TICI 1 revascularization with reocclusion s/p 2 intrastent ballon angioplasties, and unable to revascularize previous stent with multiple attempts. Patient remained on ventilator post procedure and PCCM consulted for further vent management.   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  Unsuccessful revascularization. 3/14-MRI shows acute/early subacute infarcts of both cerebellar hemispheres, both pontine hemispheres and the right occipital lobe.  Interim History / Subjective:  Off sedation the patient is able to follow commands.  Objective   Blood pressure 132/66, pulse 94, temperature 99.3 F (37.4 C), temperature source Axillary, resp. rate (!) 28, height 5\' 8"  (1.727 m), weight 124 kg, SpO2 100%.    Vent Mode: PRVC FiO2 (%):  [40 %] 40 % Set Rate:  [20 bmp] 20 bmp Vt Set:  [550 mL] 550 mL PEEP:  [5 cmH20] 5 cmH20 Pressure Support:  [10 cmH20] 10 cmH20 Plateau Pressure:  [15 cmH20-20 cmH20] 18 cmH20   Intake/Output Summary (Last 24 hours) at 06/28/2023 1107 Last data filed at 06/28/2023 0700 Gross per 24 hour  Intake 1927.96 ml  Output 1600 ml  Net 327.96 ml   Filed Weights   06/26/23 2300 06/28/23 0500  Weight: 106.6 kg 124 kg    Examination: General: Obese man lying in bed. HEENT: ET tube with minimal secretions.  Smallbore feeding tube infusing CV: Heart sounds unremarkable.  Extremities warm. pulm: Clear bilaterally. Abs: Soft and nontender. Extremities: No edema. Skin: no rash  Neuro: Awake and follows commands on left side.  Hemiplegic on the right.  No spontaneous swallowing.  Has cough. 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 History  of hypertension Type 2 diabetes Obesity  Plan:  -Continue ventilatory support.  Attempt SBT today.  Inability to protect airway is the barrier to extubation. 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  He would likely require a tracheostomy and PEG tube for airway protection which would be a significant burden on quality of life. -Family are coming into town and we will have a goals of care discussion at that time.  Hopefully will be able to involve the patient as well.  Best Practice (right click and "Reselect all SmartList Selections" daily)   Diet/type: NPO tube feed DVT prophylaxis SCD and Lovenox Pressure ulcer(s): N/A GI prophylaxis: PPI Lines: N/A Foley:  N/A Code Status:  full code Last date of multidisciplinary goals of care discussion [ family not present at beside]   CRITICAL CARE Performed by: Lynnell Catalan   Total critical care time: 40 minutes  Critical care time was exclusive of separately billable procedures and treating other patients.  Critical care was necessary to treat or prevent imminent or life-threatening deterioration.  Critical care was time spent personally by me on the following activities: development of treatment plan with patient and/or surrogate as well as nursing, discussions with consultants, evaluation of patient's response to treatment, examination of patient, obtaining history from patient or surrogate, ordering and performing treatments and interventions, ordering and review of laboratory studies, ordering and review of radiographic studies, pulse oximetry, re-evaluation of patient's condition and participation in multidisciplinary rounds.  Lynnell Catalan, MD [facility] ICU Physician [facility]  Pager: [phone] Mobile: [phone] After hours: [phone].

## 2023-06-28 NOTE — Evaluation (Signed)
 Physical Therapy Evaluation Patient Details Name: Gavin Dorsey MRN: [id number] DOB: Mar 02, 1961 Today's Date: 06/28/2023  History of Present Illness  Pt is a 63 y.o. male presenting 3/13 with unsteady gait and vision changes as well as associated fall with onset of symptoms. S/p R vertebral arteriogram 3/13. 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 Intubated 3/13- present. Recent admission for sepsis in December 2024 in which pt also found to have acute intra-axial hemorrhage at the R cerebral peduncle and L vertebral artery occlusion.Marland Kitchen PMH significant for CGA with stent placement on ASA and plavix, HTN, DM, obesity.   Clinical Impression  Pt in bed upon arrival of PT, agreeable to evaluation at this time. Prior to admission the pt was ambulating 80 ft with OPPT, living with a friend since recent d/c. The pt presents today with minimal responsiveness to commands, stimulation, or movement of extremities. He tolerated LE and UE PROM without change in HR or BP, but did not follow any commands during our session. Per RN he has been following commands with LUE and LLE. Pt also with inconsistent visual attention on therapist, not tracking today. Given severity of deficits, pt will likely need continued acute therapies and post-acute rehab <3hours/day to maximize functional recovery and mobility.          If plan is discharge home, recommend the following: Two people to help with walking and/or transfers;Two people to help with bathing/dressing/bathroom;Assistance with cooking/housework;Assistance with feeding;Direct supervision/assist for medications management;Direct supervision/assist for financial management;Assist for transportation;Help with stairs or ramp for entrance;Supervision due to cognitive status   Can travel by private vehicle   No    Equipment Recommendations Wheelchair (measurements PT);Wheelchair cushion (measurements PT);Hospital bed;Hoyer lift   Recommendations for Other Services       Functional Status Assessment Patient has had a recent decline in their functional status and demonstrates the ability to make significant improvements in function in a reasonable and predictable amount of time.     Precautions / Restrictions Precautions Precautions: Other (comment) Recall of Precautions/Restrictions: Impaired Precaution/Restrictions Comments: intubated, foley, cortrak, BP goal <180 Restrictions Weight Bearing Restrictions Per Provider Order: No      Mobility  Bed Mobility Overal bed mobility: Needs Assistance             General bed mobility comments: total A for transition to chair position in bed with wedge to support midline. no attempts to hold self up when pt does not have support laterally    Transfers                   General transfer comment: NT          Pertinent Vitals/Pain Pain Assessment Pain Intervention(s): Monitored during session    Home Living Family/patient expects to be discharged to:: Private residence Living Arrangements: Alone Available Help at Discharge: Friend(s);Available PRN/intermittently Type of Home: Apartment Home Access: Level entry     Alternate Level Stairs-Number of Steps: flight Home Layout: Two level;Bed/bath upstairs Home Equipment: Rolling Walker (2 wheels) Additional Comments: Above information gleaned from chart review December 2024 as pt unable to answer home set-up of PLOF questions as pt intubated at eval.    Prior Function Prior Level of Function : Independent/Modified Independent;Driving             Mobility Comments: Per chart review , pt was independent prior to a recent admission with CVA, has been staying at a friends house since recent d/c with  assist for IADLs       Extremity/Trunk Assessment   Upper Extremity Assessment Upper Extremity Assessment: Defer to OT evaluation RUE Deficits / Details: PROM WFL not actively moving this  session; RN reports has been flaccid when more aroused. no withdrawal to painful stimuli noted LUE Deficits / Details: PROM WFL; RN reports pt has been able to follow some commands on the L fot thumbs up, etc. no withdrawal to painful stimuli noted    Lower Extremity Assessment Lower Extremity Assessment: Difficult to assess due to impaired cognition (no active movement or command following, PROM WFL other than slight calf tightness bilaterally, no clonus noted)    Cervical / Trunk Assessment Cervical / Trunk Assessment: Other exceptions Cervical / Trunk Exceptions: large body habitus, pt turned towards R on bolster upon arrival  Communication   Communication Communication: Other (comment) Factors Affecting Communication: Trach/intubated    Cognition Arousal: Obtunded, Stuporous (predominantly obtunded but not following commands at time of eval) Behavior During Therapy: Flat affect   PT - Cognitive impairments: No family/caregiver present to determine baseline, Difficult to assess Difficult to assess due to: Level of arousal                     PT - Cognition Comments: pt not following commands at evaluation, per RN pt will show thumbs up with LUE and wiggle L toes. pt with inconsistent visual attention on therapist, not tracking today Following commands: Impaired Following commands impaired: Follows one step commands inconsistently (follows <90% of one step commands at eval, however, RN reports pt has been following some commands on his L side (thumbs up, etc))     Cueing Cueing Techniques: Verbal cues, Gestural cues, Tactile cues     General Comments General comments (skin integrity, edema, etc.): BP 115/56 (74) just prior to arrival; 108/49 (67) prior to change of position in bed; 117/52 (72) with HOB up to ~45 degrees; 109/48 (66) also with head up to 45 degrees; 118/52 (72) return to supine/head down to 30 degrees    Exercises Low Level/ICU Exercises Ankle  Circles/Pumps: PROM, Both, 10 reps Heel Slides: PROM, Both, 10 reps Shoulder Flexion: PROM, Both, 5 reps Elbow Flexion: PROM, Both, 5 reps   Assessment/Plan    PT Assessment Patient needs continued PT services  PT Problem List Decreased strength;Decreased range of motion;Decreased activity tolerance;Decreased balance;Decreased mobility;Decreased coordination;Decreased knowledge of use of DME;Decreased cognition;Decreased safety awareness;Impaired sensation;Obesity       PT Treatment Interventions DME instruction;Gait training;Stair training;Functional mobility training;Therapeutic activities;Therapeutic exercise;Balance training;Neuromuscular re-education;Cognitive remediation;Patient/family education    PT Goals (Current goals can be found in the Care Plan section)  Acute Rehab PT Goals Patient Stated Goal: none stated, no family present PT Goal Formulation: Patient unable to participate in goal setting Time For Goal Achievement: 07/12/23 Potential to Achieve Goals: Fair    Frequency Min 2X/week        AM-PAC PT "6 Clicks" Mobility  Outcome Measure Help needed turning from your back to your side while in a flat bed without using bedrails?: Total Help needed moving from lying on your back to sitting on the side of a flat bed without using bedrails?: Total Help needed moving to and from a bed to a chair (including a wheelchair)?: Total Help needed standing up from a chair using your arms (e.g., wheelchair or bedside chair)?: Total Help needed to walk in hospital room?: Total Help needed climbing 3-5 steps with a railing? : Total 6 Click Score:  6    End of Session Equipment Utilized During Treatment:  (vent) Activity Tolerance: Patient limited by lethargy Patient left: in bed;with call bell/phone within reach Nurse Communication: Mobility status;Need for lift equipment PT Visit Diagnosis: Unsteadiness on feet (R26.81);Other abnormalities of gait and mobility (R26.89);Muscle  weakness (generalized) (M62.81);Hemiplegia and hemiparesis Hemiplegia - Right/Left: Right Hemiplegia - dominant/non-dominant: Dominant Hemiplegia - caused by: Cerebral infarction    Time: 1240-1257 PT Time Calculation (min) (ACUTE ONLY): 17 min   Charges:   PT Evaluation $PT Eval High Complexity: 1 High   PT General Charges $$ ACUTE PT VISIT: 1 Visit         Vickki Muff, PT, DPT   Acute Rehabilitation Department Office [phone] Secure Chat Communication Preferred  Ronnie Derby 06/28/2023, 2:01 PM

## 2023-06-28 NOTE — Evaluation (Addendum)
 Occupational Therapy Evaluation Patient Details Name: Gavin Dorsey MRN: [id number] DOB: January 15, 1961 Today's Date: 06/28/2023   History of Present Illness   Pt is a 63 y.o. male presenting 3/13 with unsteady gait and vision changes as well as associated fall with onset of symptoms. S/p R vertebral arteriogram 3/13. 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 Intubated 3/13- present. Recent admission for sepsis in December 2024 in which pt also found to have acute intra-axial hemorrhage at the R cerebral peduncle and L vertebral artery occlusion.Marland Kitchen PMH significant for CGA with stent placement on ASA and plavix, HTN, DM, obesity.     Clinical Impressions PTA, per chart, pt from home; at admission in December, was living with friend and independent with ADL but receiving assist with IADL; no family/friend present in room at eval to confirm or deny. Upon eval,off sedation since 5 am; pt obtunded-stuporous with very limited command following <10% of the time. Pt needing total A for ADL at this time and with no active movement of extremities during eval, although RN reports pt able to give her a thumbs up on command earlier this morning on th L side. Will continue to assess as arousal improves. Recommending inpatient rehab <3 hours.      If plan is discharge home, recommend the following:   Other (comment) (total care at time of eval)     Functional Status Assessment   Patient has had a recent decline in their functional status and demonstrates the ability to make significant improvements in function in a reasonable and predictable amount of time.     Equipment Recommendations   Other (comment) (defer)     Recommendations for Other Services         Precautions/Restrictions   Precautions Precautions: Other (comment) Precaution/Restrictions Comments: intubated, foley,     Mobility Bed Mobility               General bed mobility  comments: total A for transition to chair position in bed with wedge to support midline. no attempts to hold self up when pt does not have support laterally    Transfers                   General transfer comment: NT      Balance                                           ADL either performed or assessed with clinical judgement   ADL Overall ADL's : Needs assistance/impaired                                       General ADL Comments: total A     Vision   Additional Comments: Per chart prior to intibation, pt reported diplopia that improved with L eye closed. pt  opened eyes inconsistently on command or to name call during session with no attempts to track or scan. Pt with fair eye alignment     Perception         Praxis         Pertinent Vitals/Pain Pain Assessment Pain Assessment: CPOT Facial Expression: Relaxed, neutral Body Movements: Absence of movements Muscle Tension: Relaxed Compliance with ventilator (intubated pts.): Tolerating ventilator or movement Vocalization (extubated  pts.): N/A CPOT Total: 0 Pain Intervention(s): Monitored during session     Extremity/Trunk Assessment Upper Extremity Assessment Upper Extremity Assessment: Difficult to assess due to impaired cognition;RUE deficits/detail;LUE deficits/detail (difficult to assess due to level of arousal) RUE Deficits / Details: PROM WFL not actively moving this session; RN reports has been flaccid when more aroused. no withdrawal to painful stimuli noted LUE Deficits / Details: PROM WFL; RN reports pt has been able to follow some commands on the L fot thumbs up, etc. no withdrawal to painful stimuli noted   Lower Extremity Assessment Lower Extremity Assessment: Defer to PT evaluation       Communication Communication Communication: Other (comment) Factors Affecting Communication: Trach/intubated   Cognition Arousal: Obtunded, Stuporous (predominantly  obtunded but not following commands at time of eval) Behavior During Therapy: Flat affect Cognition: Difficult to assess Difficult to assess due to: Intubated           OT - Cognition Comments: cognition difficult to assess secondary to low level of arousal                 Following commands: Impaired Following commands impaired: Follows one step commands inconsistently (follows <90% of one step commands at eval, however, RN reports pt has been following some commands on his L side (thumbs up, etc))     Cueing  General Comments   Cueing Techniques: Verbal cues;Gestural cues;Tactile cues  BP 115/56 (74) just prior to arrival; 108/49 (67) prior to change of position in bed; 117/52 (72) with HOB up to ~45 degrees; 109/48 (66) also with head up to 45 degrees; 118/52 (72) return to supine/head down to 30 degrees   Exercises Exercises: Other exercises Other Exercises Other Exercises: PROM BUE   Shoulder Instructions      Home Living Family/patient expects to be discharged to:: Private residence Living Arrangements: Alone Available Help at Discharge: Friend(s);Available PRN/intermittently Type of Home: Apartment Home Access: Level entry     Home Layout: Two level;Bed/bath upstairs Alternate Level Stairs-Number of Steps: flight Alternate Level Stairs-Rails: Right;Left Bathroom Shower/Tub: Tub/shower unit   Bathroom Toilet: Standard     Home Equipment: Agricultural consultant (2 wheels)   Additional Comments: Above information gleaned from chart review December 2024 as pt unable to answer home set-up of PLOF questions as pt intubated at eval.      Prior Functioning/Environment Prior Level of Function : Independent/Modified Independent;Driving             Mobility Comments: Per chart review , pt was independent prior to a recent admission with CVA, has been staying at a friends house since recent d/c with assist for IADLs      OT Problem List: Decreased  strength;Decreased activity tolerance;Impaired vision/perception;Decreased coordination;Decreased knowledge of use of DME or AE;Cardiopulmonary status limiting activity;Impaired UE functional use;Impaired sensation   OT Treatment/Interventions: Self-care/ADL training;Therapeutic exercise;DME and/or AE instruction;Patient/family education;Balance training;Therapeutic activities      OT Goals(Current goals can be found in the care plan section)   Acute Rehab OT Goals Patient Stated Goal: unable OT Goal Formulation: Patient unable to participate in goal setting Time For Goal Achievement: 07/12/23 Potential to Achieve Goals: Fair   OT Frequency:  Min 1X/week    Co-evaluation              AM-PAC OT "6 Clicks" Daily Activity     Outcome Measure Help from another person eating meals?: Total Help from another person taking care of personal grooming?: Total Help from another person  toileting, which includes using toliet, bedpan, or urinal?: Total Help from another person bathing (including washing, rinsing, drying)?: Total Help from another person to put on and taking off regular upper body clothing?: Total Help from another person to put on and taking off regular lower body clothing?: Total 6 Click Score: 6   End of Session Equipment Utilized During Treatment: Oxygen (pt intubated) Nurse Communication: Mobility status;Other (comment) (vitals)  Activity Tolerance: Patient limited by lethargy;Other (comment) (limited by level of arousal) Patient left: in bed;with call bell/phone within reach;with bed alarm set  OT Visit Diagnosis: Unsteadiness on feet (R26.81);Muscle weakness (generalized) (M62.81);History of falling (Z91.81);Low vision, both eyes (H54.2);Other symptoms and signs involving cognitive function                Time: 1240-1301 OT Time Calculation (min): 21 min Charges:  OT General Charges $OT Visit: 1 Visit OT Evaluation $OT Eval Moderate Complexity: 1 Mod  Tyler Deis, OTR/L [facility] Acute Rehabilitation Office: [phone]   Myrla Halsted 06/28/2023, 1:44 PM

## 2023-06-28 NOTE — Progress Notes (Addendum)
 STROKE TEAM PROGRESS NOTE    SIGNIFICANT HOSPITAL EVENTS 3/13: patient admitted with unsteady gait and visual changes, found to have repeat terminal right vertebral artery and basilar artery stent occlusion, thrombectomy attempted but was unsuccessful.  Patient remains intubated  INTERIM HISTORY/SUBJECTIVE MRI scan of the brain shows bilateral cerebellar and pontine left greater than right as well as right occipital infarcts. Patient has remained hemodynamically stable and afebrile overnight.  Neurologic exam stable with propofol off since 5 AM.  He continues to follow commands intermittently on the left with vertical eye movement and remains unable to move eyes horizontally. Blood pressure adequately controlled.  Vital signs stable P2Y12 elevation to 257 this morning with clopidogrel administration on 3/14  OBJECTIVE CBC    Component Value Date/Time   WBC 10.1 06/28/2023 0735   RBC 4.02 (L) 06/28/2023 0735   HGB 9.0 (L) 06/28/2023 0735   HGB 15.9 07/27/2011 1426   HCT 29.9 (L) 06/28/2023 0735   HCT 54.3 (H) 03/06/2023 1449   PLT 314 06/28/2023 0735   PLT 240 07/27/2011 1426   MCV 74.4 (L) 06/28/2023 0735   MCV 88 07/27/2011 1426   MCH 22.4 (L) 06/28/2023 0735   MCHC 30.1 06/28/2023 0735   RDW 18.6 (H) 06/28/2023 0735   RDW 13.5 07/27/2011 1426   LYMPHSABS 0.7 06/27/2023 0527   MONOABS 0.1 06/27/2023 0527   EOSABS 0.0 06/27/2023 0527   BASOSABS 0.0 06/27/2023 0527   BMET    Component Value Date/Time   NA 138 06/28/2023 0735   NA 139 07/27/2011 1426   K 3.3 (L) 06/28/2023 0735   K 3.6 07/27/2011 1426   CL 105 06/28/2023 0735   CL 104 07/27/2011 1426   CO2 27 06/28/2023 0735   CO2 27 07/27/2011 1426   GLUCOSE 152 (H) 06/28/2023 0735   GLUCOSE 123 (H) 07/27/2011 1426   BUN 25 (H) 06/28/2023 0735   BUN 17 07/27/2011 1426   CREATININE 1.17 06/28/2023 0735   CREATININE 1.27 07/27/2011 1426   CALCIUM 7.4 (L) 06/28/2023 0735   CALCIUM 8.3 (L) 07/27/2011 1426   GFRNONAA  >60 06/28/2023 0735   GFRNONAA >60 07/27/2011 1426   IMAGING past 24 hours No results found.  Vitals:   06/28/23 0700 06/28/23 0733 06/28/23 0800 06/28/23 1056  BP: 132/66     Pulse: 82 94    Resp: 20 (!) 28    Temp:   99.3 F (37.4 C)   TempSrc:   Axillary   SpO2: 99% 98%  100%  Weight:      Height:       PHYSICAL EXAM General: Intubated patient in no acute distress, off sedation approximtely 4 hours prior to neurologic evaluation Psych:  Mood and affect appropriate for situation CV: Regular rate and rhythm on monitor Respiratory: Respirations supportive via mechanical ventilation GI: Abdomen soft and nontender  NEURO:  Mental Status: Drowsy, opens eyes to voice.  PERRL, does not blink to threat throughout, oculocephalic reflex absent, cough and gag present, slight flickering of left lower extremity noted during exam.  Will intermittently follow commands with the left hand including thumbs up, squeeze examiner's hand.  Does not show 2 fingers when asked.  Will open and close eyes to command.   Eyes are able to move vertically but horizontal eye movement is not intact.  Weak cough intact, corneals are present bilaterally. No movement of the right upper and lower extremity appreciated with very minimal movement of the left hand to command as well as  possible flickering movement on the left toes. Babinski positive on the right, mute on the left.   Most Recent NIH  1a Level of Conscious.: 2 1b LOC Questions: 2 1c LOC Commands: 2 2 Best Gaze: 2 3 Visual: 0 4 Facial Palsy: 0 5a Motor Arm - left: 3 5b Motor Arm - Right: 3 6a Motor Leg - Left: 3 6b Motor Leg - Right: 4 7 Limb Ataxia: 0 8 Sensory: 0 9 Best Language: 3 10 Dysarthria: 2 11 Extinct. and Inatten.: 0 TOTAL: 26  ASSESSMENT/PLAN  [name] is a 63 y.o. male with history of stroke with basilar stent placement, hypertension, diabetes and obesity who presented for evaluation of unsteady gait and vision changes.   Initial CT revealed hypoattenuation in the left cerebellum concerning for stroke, and CT angiogram revealed repeat basilar artery occlusion.  Patient was taken to interventional radiology for thrombectomy, but this was unsuccessful.  He remains intubated after the procedure.  MRI reveals infarcts in bilateral cerebellar hemispheres, bilateral pontine hemispheres and in right occipital lobe.  Patient will likely have significant disability after these infarcts, so we will consult palliative care for discussion of goals of care with family.  NIH on Admission 5  Stroke: Bilateral cerebellar R>L, bilateral pontine L>R, and right PCA punctate infarcts, etiology: Reocclusion of R VA and BA stent   Code Stroke CT head hypoattenuation in left cerebellar hemisphere in SCA territory ASPECTS 10.    CTA head & neck occlusion of distal right V4 segment, occlusion of proximal BA with occlusion of stent, thrombus likely involving origin of left SCA, severe stenosis of distal L SCA, occlusion of left VA MRI acute infarcts of bilateral cerebellar hemispheres, both pontine hemispheres and right occipital lobe 2D Echo EF 60 to 65%, mild concentric LVH, grade 1 diastolic dysfunction, aortic dilatation, normal left atrial size, no atrial level shunt LDL 107 HgbA1c 6.1 P2 Y12 = 257 VTE prophylaxis -Lovenox aspirin 81 mg daily and clopidogrel 75 mg daily prior to admission, now on aspirin 81 mg daily and clopidogrel 75 mg daily for now Therapy recommendations:  SNF Disposition: Pending, palliative care consulted  Hx of Stroke/TIA 04/02/2023 admitted for brainstem and left cerebellar infarct.  CT head and neck and cerebral angio showed left VA chronic occlusion, right VA new occlusion and proximal BA occlusion.  Status post stenting.  EF 55 to 60%.  LDL 10, A1c 5.2.  Discharged on aspirin and Brilinta. 04/14/2023 admitted for right cerebral peduncle ICH and GI bleeding.  Brilinta was discontinued.  Patient discharged on  aspirin and Plavix  Hypertension Home meds: None Stable Blood Pressure Goal: SBP 120-160 for first 24 hours then less than 180/105   Hyperlipidemia Home meds: Rosuvastatin 40 mg daily, resumed in hospital LDL 107, goal < 70 Will add zetia if aggressive management Continue statin at discharge  Diabetes type II Controlled Home meds: None HgbA1c 6.1, goal < 7.0 CBGs SSI Recommend close follow-up with PCP for better DM control  Respiratory failure Patient left intubated after attempted thrombectomy Ventilator management per CCM Wean as tolerated Extubate when able  Dysphagia Now NPO On TF now @ 35  Other Stroke Risk Factors Obesity, Body mass index is 41.57 kg/m., BMI >/= 30 associated with increased stroke risk, recommend weight loss, diet and exercise as appropriate   Other Active Problems Goals of care discussions pending with family Patient likely to have significant neurologic disability and will likely require trach/PEG  Pending palliative care GOC discussion when  family can visit from out of town Hypokalemia K 3.3, replete and trend  Hospital day # 2  Patient seen by NP with MD, MD to edit note as needed. Kara Mead , MSN, AGACNP-BC [facility] See Amion for schedule and pager information 06/28/2023 1:25 PM  ATTENDING NOTE: I reviewed above note and agree with the assessment and plan. Pt was seen and examined.   RN at the bedside. Pt intubated not on sedation, eyes open on voice, following simple commands on the left hand and foot. With eye opening, eyes in mid position, not blinking to visual threat bilaterally, closed eyes on command, and also vertically move eyes on command but horizontal eye movement deficit, PERRL. Corneal reflex present weakly bilaterally, gag and cough present but weak. Breathing over the vent.  Facial symmetry not able to test due to ET tube.  Tongue protrusion not cooperative. On pain stimulation, no significant  movement bilaterally, however pt was able to follow commands showing two fingers, grip and wiggle toes on the left. No babinski. Sensation, coordination and gait not tested.   No family at bedside.  Patient still intubated, following simple commands but still very lethargic, not moving extremities.  Patient had previous stroke status post stenting and put on aspirin and Brilinta.  Unfortunately he was readmitted for ICH and GI bleeding, antiplatelet regimen changed to aspirin and Plavix.  Currently readmitted for basilar artery stent occlusion, P2 Y12 indicating patient probably nonresponder to Plavix.  At this time, patient likely to have severe neurological deficit, palliative care on board.  If aggressive care, may consider aspirin and Brilinta as well as add Zetia onto Crestor.  For detailed assessment and plan, please refer to above/below as I have made changes wherever appropriate.   Marvel Plan, MD PhD Stroke Neurology 06/28/2023 6:13 PM  This patient is critically ill due to brainstem infarct, basilar artery stent reocclusion, respiratory failure, dysphagia and at significant risk of neurological worsening, death form recurrent stroke, hemorrhagic transformation, sepsis, pneumonia. This patient's care requires constant monitoring of vital signs, hemodynamics, respiratory and cardiac monitoring, review of multiple databases, neurological assessment, discussion with family, other specialists and medical decision making of high complexity. I spent 40 minutes of neurocritical care time in the care of this patient.    To contact Stroke Continuity provider, please refer to [email or web address]. After hours, contact General Neurology

## 2023-06-29 ENCOUNTER — Inpatient Hospital Stay (HOSPITAL_COMMUNITY)

## 2023-06-29 DIAGNOSIS — I6389 Other cerebral infarction: Secondary | ICD-10-CM

## 2023-06-29 DIAGNOSIS — I63213 Cerebral infarction due to unspecified occlusion or stenosis of bilateral vertebral arteries: Secondary | ICD-10-CM | POA: Diagnosis not present

## 2023-06-29 DIAGNOSIS — E119 Type 2 diabetes mellitus without complications: Secondary | ICD-10-CM | POA: Diagnosis not present

## 2023-06-29 DIAGNOSIS — R1310 Dysphagia, unspecified: Secondary | ICD-10-CM | POA: Diagnosis not present

## 2023-06-29 DIAGNOSIS — R569 Unspecified convulsions: Secondary | ICD-10-CM | POA: Diagnosis not present

## 2023-06-29 DIAGNOSIS — J9601 Acute respiratory failure with hypoxia: Secondary | ICD-10-CM

## 2023-06-29 DIAGNOSIS — E785 Hyperlipidemia, unspecified: Secondary | ICD-10-CM | POA: Diagnosis not present

## 2023-06-29 LAB — CBC
HCT: 32.5 % — ABNORMAL LOW (ref 39.0–52.0)
Hemoglobin: 9.3 g/dL — ABNORMAL LOW (ref 13.0–17.0)
MCH: 22.6 pg — ABNORMAL LOW (ref 26.0–34.0)
MCHC: 28.6 g/dL — ABNORMAL LOW (ref 30.0–36.0)
MCV: 78.9 fL — ABNORMAL LOW (ref 80.0–100.0)
Platelets: 146 10*3/uL — ABNORMAL LOW (ref 150–400)
RBC: 4.12 MIL/uL — ABNORMAL LOW (ref 4.22–5.81)
RDW: 18.9 % — ABNORMAL HIGH (ref 11.5–15.5)
WBC: 8.1 10*3/uL (ref 4.0–10.5)
nRBC: 0 % (ref 0.0–0.2)

## 2023-06-29 LAB — URINALYSIS, ROUTINE W REFLEX MICROSCOPIC
Bilirubin Urine: NEGATIVE
Glucose, UA: 150 mg/dL — AB
Hgb urine dipstick: NEGATIVE
Ketones, ur: NEGATIVE mg/dL
Leukocytes,Ua: NEGATIVE
Nitrite: NEGATIVE
Protein, ur: NEGATIVE mg/dL
Specific Gravity, Urine: 1.024 (ref 1.005–1.030)
pH: 7 (ref 5.0–8.0)

## 2023-06-29 LAB — BASIC METABOLIC PANEL
Anion gap: 9 (ref 5–15)
BUN: 27 mg/dL — ABNORMAL HIGH (ref 8–23)
CO2: 24 mmol/L (ref 22–32)
Calcium: 7.7 mg/dL — ABNORMAL LOW (ref 8.9–10.3)
Chloride: 105 mmol/L (ref 98–111)
Creatinine, Ser: 1.02 mg/dL (ref 0.61–1.24)
GFR, Estimated: >60 mL/min (ref >60)
Glucose, Bld: 154 mg/dL — ABNORMAL HIGH (ref 70–99)
Potassium: 4.5 mmol/L (ref 3.5–5.1)
Sodium: 138 mmol/L (ref 135–145)

## 2023-06-29 LAB — GLUCOSE, CAPILLARY
Glucose-Capillary: 135 mg/dL — ABNORMAL HIGH (ref 70–99)
Glucose-Capillary: 156 mg/dL — ABNORMAL HIGH (ref 70–99)
Glucose-Capillary: 189 mg/dL — ABNORMAL HIGH (ref 70–99)
Glucose-Capillary: 177 mg/dL — ABNORMAL HIGH (ref 70–99)

## 2023-06-29 LAB — PHOSPHORUS: Phosphorus: 2.9 mg/dL (ref 2.5–4.6)

## 2023-06-29 LAB — MAGNESIUM: Magnesium: 2.2 mg/dL (ref 1.7–2.4)

## 2023-06-29 MED ORDER — POLYVINYL ALCOHOL 1.4 % OP SOLN: 1.0000 [drp] | Freq: Four times a day (QID) | OPHTHALMIC | Status: DC | PRN

## 2023-06-29 MED ORDER — GLYCOPYRROLATE 0.2 MG/ML IJ SOLN
0.2000 mg | INTRAMUSCULAR | Status: DC | PRN
  Administered 2023-06-29 – 2023-06-30 (×4): 0.2 mg via INTRAVENOUS
  Filled 2023-06-29 (×4): qty 1

## 2023-06-29 MED ORDER — GLYCOPYRROLATE 0.2 MG/ML IJ SOLN: 0.2000 mg | INTRAMUSCULAR | Status: DC | PRN

## 2023-06-29 MED ORDER — SODIUM CHLORIDE 0.9 % IV SOLN: INTRAVENOUS | Status: DC

## 2023-06-29 MED ORDER — ACETAMINOPHEN 325 MG PO TABS: 650.0000 mg | ORAL_TABLET | Freq: Four times a day (QID) | ORAL | Status: DC | PRN

## 2023-06-29 MED ORDER — MORPHINE BOLUS VIA INFUSION
5.0000 mg | INTRAVENOUS | Status: DC | PRN
  Administered 2023-06-29: 5 mg via INTRAVENOUS

## 2023-06-29 MED ORDER — MORPHINE 100MG IN NS 100ML (1MG/ML) PREMIX INFUSION
0.0000 mg/h | INTRAVENOUS | Status: DC
  Administered 2023-06-29: 15 mg/h via INTRAVENOUS
  Administered 2023-06-29: 5 mg/h via INTRAVENOUS
  Administered 2023-06-30 (×2): 15 mg/h via INTRAVENOUS
  Filled 2023-06-29 (×4): qty 100

## 2023-06-29 MED ORDER — ACETAMINOPHEN 650 MG RE SUPP: 650.0000 mg | Freq: Four times a day (QID) | RECTAL | Status: DC | PRN

## 2023-06-29 MED ORDER — LORAZEPAM 2 MG/ML IJ SOLN: 2.0000 mg | INTRAMUSCULAR | Status: DC | PRN

## 2023-06-29 MED ORDER — GLYCOPYRROLATE 1 MG PO TABS
1.0000 mg | ORAL_TABLET | ORAL | Status: DC | PRN
  Filled 2023-06-29: qty 1

## 2023-06-29 NOTE — Procedures (Signed)
 Extubation Procedure Note  Patient Details:   Name: Gavin Dorsey DOB: [birth year] MRN: [id number]   Airway Documentation:    Vent end date: 06/29/23 Vent end time: 1617   Evaluation  O2 sats: stable throughout Complications: No apparent complications Patient did tolerate procedure well. Bilateral Breath Sounds: Rhonchi, Diminished   No, pt could not speak post extubation.  Pt extubated to room air per physician's order and in accordance with the family's wishes.  [name] 06/29/2023, 4:17 PM

## 2023-06-29 NOTE — Progress Notes (Signed)
 Chaplain visits at family request and finds pt accompanied by cousin Ginger and her father Reita Cliche. Ginger shares that she knows pt didn't want to live on life support; Reita Cliche seems to be having a harder time accepting pt's condition is as dire as it appears. Chaplain provides grief support -- Ginger's mother died Christmas Day 2024 -- and she and Reita Cliche share names of several other family members who have died. Chaplain encourages life review and provides prayer and support.

## 2023-06-29 NOTE — Progress Notes (Signed)
 Critical care progress  EEG consistent with encephalopathy but no seizures.   Family has conferred and is now requesting at transition to comfort care based on prior discussion with Dr Pearlean Brownie pointing to severe long-term disability with need for feeding tube and tracheostomy.   Code status changed to DNR comfort care and orders entered.   Lynnell Catalan, MD [facility] ICU Physician [facility] [place] Critical Care  Pager: [phone] Or Epic Secure Chat After hours: [phone].  06/29/2023, 3:50 PM

## 2023-06-29 NOTE — Consult Note (Signed)
 NAME:  Gavin Dorsey, MRN:  [id number], DOB:  April 04, 1961, LOS: 3 ADMISSION DATE:  06/26/2023, CONSULTATION DATE: 06/26/23 REFERRING MD:  Corliss Skains  CHIEF COMPLAINT: Right side weakness and dysarthria-Code Stroke  History of Present Illness:  Pt is a 63 yr old male with significant past medical hx of prior CVA s/p stent placement on DAPT (ASA and Plavix- per documentation last dose taken on 06/25/23), HTN, DM, obesity who presented to [facility] ED for changes in vision and dysarthria, last known well was at 1400 on 3/13. Patient was with friend at the time of onset, and apparently fell over into nearby bushes but not sure if injury to head was sustained. Attempted to drive to hospital but unable to correctively maintain vehicle on road. Friend stopped patient in car and drove patient to [facility] ED. Code stroke initiated, and STAT CT was obtained. CT of head noted hypoattentuation in left cerebellum with superior cerebellar artery territory, concerns for acute vs subacute infarct but no acute intracranial hemorrhage. CT angio revealed an occlusion of the right vertebral artery-distal V4 segment, occlusion of the proximal basilar artery, previous stent in basilar artery appearing occluded as well, multifocal atherosclerosis and moderate stenosis of the right supraclinoid ICA. NIHSS 5 originally progressed to 14 with new onset of facial droop, increased dysarthria, RUE/RLE weakness. Patient was taken to IR STAT for attempt in revascularization. 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 TICI 1 revascularization with reocclusion s/p 2 intrastent ballon angioplasties, and unable to revascularize previous stent with multiple attempts. Patient remained on ventilator post procedure and PCCM consulted for further vent management.   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  Unsuccessful revascularization. 3/14-MRI shows acute/early subacute infarcts of both cerebellar hemispheres, both pontine hemispheres and the right occipital lobe.  Interim History / Subjective:  Remained off sedation overnight but stopped following commands.  Not tolerating SBT today  Objective   Blood pressure 120/62, pulse 89, temperature 99.2 F (37.3 C), temperature source Axillary, resp. rate (!) 21, height 5\' 8"  (1.727 m), weight 124.1 kg, SpO2 95%.    Vent Mode: PRVC FiO2 (%):  [40 %] 40 % Set Rate:  [20 bmp] 20 bmp Vt Set:  [550 mL] 550 mL PEEP:  [5 cmH20] 5 cmH20 Plateau Pressure:  [15 cmH20-19 cmH20] 16 cmH20   Intake/Output Summary (Last 24 hours) at 06/29/2023 1115 Last data filed at 06/29/2023 0800 Gross per 24 hour  Intake 1153 ml  Output 1725 ml  Net -572 ml   Filed Weights   06/26/23 2300 06/28/23 0500 06/29/23 0500  Weight: 106.6 kg 124 kg 124.1 kg    Examination: General: Obese man lying in bed. HEENT: ET tube with minimal secretions.  Smallbore feeding tube infusing CV: Heart sounds unremarkable.  Extremities warm. pulm: Clear bilaterally. Abs: Soft and nontender. Extremities: No edema. Skin: no rash  Neuro: Eyes are open with rhythmic flickering at slow frequency.  Bobbing up and down gaze.  Not following commands no response to pain. GU: intact, condom cath   Ancillary tests  personally reviewed:  Normal electrolytes this morning. 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 History of hypertension Type 2 diabetes Obesity  Plan:  -Continue ventilatory support.  Attempt SBT daily inability to protect airway is the barrier to extubation. -Less responsive with unusual eye movements.  Will obtain EEG. 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  He would likely require a tracheostomy and PEG tube for airway protection which would be a significant burden on quality of life. -Family are coming into town and we will have a goals of care discussion with Palliative care scheduled for 3/16.  Best Practice (right click and "Reselect all SmartList Selections" daily)   Diet/type: NPO tube feed DVT prophylaxis SCD and Lovenox Pressure ulcer(s): N/A GI prophylaxis: PPI Lines: N/A Foley:  N/A Code Status:  full code Last date of multidisciplinary goals of care discussion [ family not present at beside]   CRITICAL CARE Performed by: Lynnell Catalan   Total critical care time: 40 minutes  Critical care time was exclusive of separately billable procedures and treating other patients.  Critical care was necessary to treat or prevent imminent or life-threatening deterioration.  Critical care was time spent personally by me on the following activities: development of treatment plan with patient and/or surrogate as well as nursing, discussions with consultants, evaluation of patient's response to treatment, examination of patient, obtaining history from patient or surrogate, ordering and performing treatments and interventions, ordering and review of laboratory studies, ordering and review of radiographic studies, pulse oximetry, re-evaluation of patient's condition and participation in multidisciplinary rounds.  Lynnell Catalan, MD [facility] ICU Physician [facility] [facility]   Pager: [phone] Mobile: [phone] After hours: [phone].

## 2023-06-29 NOTE — Progress Notes (Signed)
 Upon initial pt assessment, pt minimally followed commands and had movement to stimuli. Upon pt reassessment, pt no longer following commands nor responding to stimuli. Pt began to have rhythmic eye movements. Sal, MD paged. Spoke with Sal, MD. Re-checked BP and CBG per Sal, MD. Sal, MD at the bedside to assess pt. No new orders at this time. This RN will continue to monitor and assess the pt and notify of any additional changes.

## 2023-06-29 NOTE — Procedures (Addendum)
 Patient Name: Gavin Dorsey  MRN: [id number]  Epilepsy Attending: Charlsie Quest  Referring Physician/Provider: Lynnell Catalan, MD  Date: 06/28/2023 Duration: 23.42 mins  Patient history: 63yo M with ams. EEG to evaluate for seizure  Level of alertness: Awake/ lethargic   AEDs during EEG study: None  Technical aspects: This EEG study was done with scalp electrodes positioned according to the 10-20 International system of electrode placement. Electrical activity was reviewed with band pass filter of 1-70Hz , sensitivity of 7 uV/mm, display speed of 63mm/sec with a 60Hz  notched filter applied as appropriate. EEG data were recorded continuously and digitally stored.  Video monitoring was available and reviewed as appropriate.  Description: No clear posterior dominant rhythm was seen. EEG showed continuous generalized predominantly 8Hz  alpha activity admixed with intermittent generalized 6-7hz  theta slowing, not reactive to stimulation. Hyperventilation and photic stimulation were not performed.     ABNORMALITY - Alpha coma, generalized - Intermittent slow, generalized  IMPRESSION: This study is suggestive of moderate to severe diffuse encephalopathy. No seizures or epileptiform discharges were seen throughout the recording.  Takiesha Mcdevitt Annabelle Harman

## 2023-06-29 NOTE — Progress Notes (Addendum)
 STROKE TEAM PROGRESS NOTE    SIGNIFICANT HOSPITAL EVENTS 3/13: patient admitted with unsteady gait and visual changes, found to have repeat terminal right vertebral artery and basilar artery stent occlusion, thrombectomy attempted but was unsuccessful.  Patient remains intubated  INTERIM HISTORY/SUBJECTIVE Patient remains hemodynamically stable but had fever up to 101.3 last night.  Will check urinalysis and chest x-ray.  Plan is for goals of care conversation with palliative care and family soon.  OBJECTIVE CBC    Component Value Date/Time   WBC 8.1 06/29/2023 0506   RBC 4.12 (L) 06/29/2023 0506   HGB 9.3 (L) 06/29/2023 0506   HGB 15.9 07/27/2011 1426   HCT 32.5 (L) 06/29/2023 0506   HCT 54.3 (H) 03/06/2023 1449   PLT 146 (L) 06/29/2023 0506   PLT 240 07/27/2011 1426   MCV 78.9 (L) 06/29/2023 0506   MCV 88 07/27/2011 1426   MCH 22.6 (L) 06/29/2023 0506   MCHC 28.6 (L) 06/29/2023 0506   RDW 18.9 (H) 06/29/2023 0506   RDW 13.5 07/27/2011 1426   LYMPHSABS 0.7 06/27/2023 0527   MONOABS 0.1 06/27/2023 0527   EOSABS 0.0 06/27/2023 0527   BASOSABS 0.0 06/27/2023 0527   BMET    Component Value Date/Time   NA 138 06/29/2023 0506   NA 139 07/27/2011 1426   K 4.5 06/29/2023 0506   K 3.6 07/27/2011 1426   CL 105 06/29/2023 0506   CL 104 07/27/2011 1426   CO2 24 06/29/2023 0506   CO2 27 07/27/2011 1426   GLUCOSE 154 (H) 06/29/2023 0506   GLUCOSE 123 (H) 07/27/2011 1426   BUN 27 (H) 06/29/2023 0506   BUN 17 07/27/2011 1426   CREATININE 1.02 06/29/2023 0506   CREATININE 1.27 07/27/2011 1426   CALCIUM 7.7 (L) 06/29/2023 0506   CALCIUM 8.3 (L) 07/27/2011 1426   GFRNONAA >60 06/29/2023 0506   GFRNONAA >60 07/27/2011 1426   IMAGING past 24 hours No results found.  Vitals:   06/29/23 0900 06/29/23 1000 06/29/23 1100 06/29/23 1138  BP: (!) 143/73 137/73 120/62   Pulse: 87 85 89   Resp: 20 20 (!) 21   Temp:    99.5 F (37.5 C)  TempSrc:    Axillary  SpO2: 98% 96% 95%    Weight:      Height:       PHYSICAL EXAM General: Intubated patient in no acute distress, off sedation  Psych:  Mood and affect appropriate for situation CV: Regular rate and rhythm on monitor Respiratory: Respirations supported via mechanical ventilation GI: Abdomen soft and nontender  NEURO:  Mental Status: Drowsy, opens eyes to repeated tactile stimulation.  PERRL, does not blink to threat throughout, oculocephalic reflex absent, cough and gag present, slight flickering of left lower extremity noted during exam.  Able to raise and lower eyes to commands, but does not move extremities to commands today.  Eyes are able to move vertically but horizontal eye movement is not intact.  Weak cough intact, corneals are present but weak bilaterally. No response to noxious stimulation in all 4 extremities.  Most Recent NIH  1a Level of Conscious.: 2 1b LOC Questions: 2 1c LOC Commands: 2 2 Best Gaze: 2 3 Visual: 0 4 Facial Palsy: 0 5a Motor Arm - left: 3 5b Motor Arm - Right: 3 6a Motor Leg - Left: 3 6b Motor Leg - Right: 4 7 Limb Ataxia: 0 8 Sensory: 0 9 Best Language: 3 10 Dysarthria: 2 11 Extinct. and Inatten.: 0 TOTAL: 26  ASSESSMENT/PLAN  [name] is a 63 y.o. male with history of stroke with basilar stent placement, hypertension, diabetes and obesity who presented for evaluation of unsteady gait and vision changes.  Initial CT revealed hypoattenuation in the left cerebellum concerning for stroke, and CT angiogram revealed repeat basilar artery occlusion.  Patient was taken to interventional radiology for thrombectomy, but this was unsuccessful.  He remains intubated after the procedure.  MRI reveals infarcts in bilateral cerebellar hemispheres, bilateral pontine hemispheres and in right occipital lobe.  Patient will likely have significant disability after these infarcts, so we will consult palliative care for discussion of goals of care with family.  NIH on Admission  5  Stroke: Bilateral cerebellar R>L, bilateral pontine L>R, and right PCA punctate infarcts, etiology: Reocclusion of R VA and BA stent   Code Stroke CT head hypoattenuation in left cerebellar hemisphere in SCA territory ASPECTS 10.    CTA head & neck occlusion of distal right V4 segment, occlusion of proximal BA with occlusion of stent, thrombus likely involving origin of left SCA, severe stenosis of distal L SCA, occlusion of left VA MRI acute infarcts of bilateral cerebellar hemispheres, both pontine hemispheres and right occipital lobe 2D Echo EF 60 to 65%, mild concentric LVH, grade 1 diastolic dysfunction, aortic dilatation, normal left atrial size, no atrial level shunt LDL 107 HgbA1c 6.1 P2 Y12 = 257 VTE prophylaxis -Lovenox aspirin 81 mg daily and clopidogrel 75 mg daily prior to admission, now on aspirin 81 mg daily and clopidogrel 75 mg daily. Therapy recommendations:  SNF Disposition: Pending, palliative care is discussing GOC with patient family.  Hx of Stroke/TIA 04/02/2023 admitted for brainstem and left cerebellar infarct.  CT head and neck and cerebral angio showed left VA chronic occlusion, right VA new occlusion and proximal BA occlusion.  Status post stenting.  EF 55 to 60%.  LDL 10, A1c 5.2.  Discharged on aspirin and Brilinta. 04/14/2023 admitted for right cerebral peduncle ICH and GI bleeding.  Brilinta was discontinued.  Patient discharged on aspirin and Plavix  Hypertension Home meds: None Stable Blood Pressure Goal: SBP 120-160 for first 24 hours then less than 180/105   Hyperlipidemia Home meds: Rosuvastatin 40 mg daily, resumed in hospital LDL 107, goal < 70 Will add zetia if aggressive management Continue statin at discharge  Diabetes type II Controlled Home meds: None HgbA1c 6.1, goal < 7.0 CBGs SSI Recommend close follow-up with PCP for better DM control  Respiratory failure ?  Pneumonia Patient left intubated after attempted  thrombectomy Ventilator management per CCM Wean as tolerated Extubate when able Fever Tmax 101.3, no leukocytosis  Dysphagia Now NPO On TF now @ 35  Other Stroke Risk Factors Obesity, Body mass index is 41.6 kg/m., BMI >/= 30 associated with increased stroke risk, recommend weight loss, diet and exercise as appropriate   Other Active Problems Anemia, hemoglobin 10.1--9.0--9.3 Thrombocytopenia, platelet 340--314--146 Goals of care discussions pending with family Patient likely to have significant neurologic disability and will likely require trach/PEG  Pending palliative care GOC discussion when family today Hypokalemia K 3.3--4.5  Hospital day # 3  Patient seen by NP with MD, MD to edit note as needed. Cortney E Ernestina Columbia , MSN, AGACNP-BC [facility] See Amion for schedule and pager information 06/29/2023 12:39 PM  ATTENDING NOTE: I reviewed above note and agree with the assessment and plan. Pt was seen and examined.   Patient still intubated, more lethargic and drowsy than yesterday.  Eyes  closed but open on voice, however not follow commands on the left arm or foot.  Seems to still follow commands on eye vertical movement, still has deficit on horizontal eye movement.  No significant movement in all extremities with pain stimulation.  On DAPT.  Had a fever yesterday, but no leukocytosis.  Platelet dropping.  Palliative care on board, having discussion with family for GOC.  For detailed assessment and plan, please refer to above/below as I have made changes wherever appropriate.   Marvel Plan, MD PhD Stroke Neurology 06/29/2023 6:20 PM  This patient is critically ill due to brainstem infarct, basilar artery stent reocclusion, respiratory failure, dysphagia and at significant risk of neurological worsening, death form recurrent stroke, hemorrhagic transformation, sepsis, pneumonia. This patient's care requires constant monitoring of vital signs, hemodynamics,  respiratory and cardiac monitoring, review of multiple databases, neurological assessment, discussion with family, other specialists and medical decision making of high complexity. I spent 35 minutes of neurocritical care time in the care of this patient.  Discussed with CCM Dr. Denese Killings.  To contact Stroke Continuity provider, please refer to [email or web address]. After hours, contact General Neurology

## 2023-06-29 NOTE — Progress Notes (Signed)
 eLink Physician-Brief Progress Note Patient Name: Gavin Dorsey DOB: 1960-06-08 MRN: [id number]   Date of Service  06/29/2023  HPI/Events of Note  Patient is comfort but has scheduled meds due still.  eICU Interventions  Evaluated all medication, nonmedication orders and discontinued any nonessential and none comfort oriented orders     Intervention Category Minor Interventions: Routine modifications to care plan (e.g. PRN medications for pain, fever)  Michelangelo Rindfleisch 06/29/2023, 7:30 PM

## 2023-06-30 ENCOUNTER — Ambulatory Visit: Payer: Self-pay | Admitting: Physical Therapy

## 2023-06-30 DIAGNOSIS — R1310 Dysphagia, unspecified: Secondary | ICD-10-CM | POA: Diagnosis not present

## 2023-06-30 DIAGNOSIS — I63213 Cerebral infarction due to unspecified occlusion or stenosis of bilateral vertebral arteries: Secondary | ICD-10-CM | POA: Diagnosis not present

## 2023-06-30 DIAGNOSIS — I639 Cerebral infarction, unspecified: Secondary | ICD-10-CM | POA: Diagnosis not present

## 2023-06-30 DIAGNOSIS — J9601 Acute respiratory failure with hypoxia: Secondary | ICD-10-CM | POA: Diagnosis not present

## 2023-06-30 DIAGNOSIS — Z66 Do not resuscitate: Secondary | ICD-10-CM

## 2023-06-30 DIAGNOSIS — Z789 Other specified health status: Secondary | ICD-10-CM

## 2023-06-30 DIAGNOSIS — R0989 Other specified symptoms and signs involving the circulatory and respiratory systems: Secondary | ICD-10-CM

## 2023-06-30 DIAGNOSIS — Z7189 Other specified counseling: Secondary | ICD-10-CM

## 2023-06-30 DIAGNOSIS — Z515 Encounter for palliative care: Secondary | ICD-10-CM | POA: Diagnosis not present

## 2023-06-30 DIAGNOSIS — E785 Hyperlipidemia, unspecified: Secondary | ICD-10-CM | POA: Diagnosis not present

## 2023-06-30 DIAGNOSIS — I6389 Other cerebral infarction: Secondary | ICD-10-CM | POA: Diagnosis not present

## 2023-06-30 MED ORDER — BIOTENE DRY MOUTH MT LIQD: 15.0000 mL | Freq: Two times a day (BID) | OROMUCOSAL | Status: DC

## 2023-06-30 MED ORDER — GLYCOPYRROLATE 0.2 MG/ML IJ SOLN: 0.2000 mg | Freq: Four times a day (QID) | INTRAMUSCULAR | Status: DC

## 2023-06-30 MED ORDER — LORAZEPAM 2 MG/ML IJ SOLN: 2.0000 mg | INTRAMUSCULAR | Status: DC | PRN

## 2023-07-03 ENCOUNTER — Ambulatory Visit: Payer: Self-pay | Admitting: Physical Therapy

## 2023-07-07 ENCOUNTER — Ambulatory Visit: Payer: Self-pay | Admitting: Physical Therapy

## 2023-07-15 NOTE — Progress Notes (Signed)
 Nutrition Brief Note  Chart reviewed. Pt now transitioning to comfort care.  No further nutrition interventions planned at this time.  Please re-consult as needed.   Greig Castilla, RD, LDN Registered Dietitian II Please reach out via secure chat Weekend on-call pager # available in [facility]

## 2023-07-15 NOTE — Death Summary Note (Signed)
 DEATH SUMMARY   Patient Details  Name: Gavin Dorsey MRN: [id number] DOB: 1960-06-16  Admission/Discharge Information   Admit Date:  07/21/2023  Date of Death: Date of Death: July 25, 2023  Time of Death: Time of Death: 07/30/33  Length of Stay: 4  Referring Physician: Simonne Martinet, MD   Reason(s) for Hospitalization  Stroke: Bilateral cerebellar R>L, bilateral pontine L>R, and right PCA punctate infarcts, etiology: Reocclusion of R VA and BA stent   Diagnoses  Preliminary cause of death:  Secondary Diagnoses (including complications and co-morbidities):  Principal Problem:   Acute ischemic stroke [facility]) Active Problems:   Basilar artery occlusion with cerebral infarction [facility]) Essential hypertension  Hyperlipidemia  DM 2  Acute respiratory Failure due to acute basilar artery occlusion  Obesity  Dysphagia   Brief Hospital Course (including significant findings, care, treatment, and services provided and events leading to death)  Gavin Dorsey is a 63 y.o. year old male with history of stroke with basilar stent placement, hypertension, diabetes and obesity who presented for evaluation of unsteady gait and vision changes. NIH on Admission 5 Initial CT revealed hypoattenuation in the left cerebellum concerning for stroke, and CT angiogram revealed repeat basilar artery occlusion. Patient was taken to interventional radiology for thrombectomy, but this was unsuccessful. He remains intubated after the procedure. MRI reveals infarcts in bilateral cerebellar hemispheres, bilateral pontine hemispheres and in right occipital lobe. Patient will likely have significant disability after these infarcts, so consulted palliative care for discussion of goals of care with family. Family opted for full comfort measures and patient was terminally extubated on 06/29/2023. Patient expired on 07/25/23 @ 1315    Pertinent Labs and Studies  Significant Diagnostic Studies IR PERCUTANEOUS ART  THROMBECTOMY/INFUSION INTRACRANIAL INC DIAG ANGIO Result Date: 07-25-2023 INDICATION: New onset right-sided weakness, speech difficulties, and decreased level of consciousness. Occluded bilateral vertebrobasilar junctions and proximal basilar artery on CT angiogram of the head and neck. EXAM: 1. EMERGENT LARGE VESSEL OCCLUSION THROMBOLYSIS (POSTERIOR CIRCULATION) COMPARISON:  CT angiogram of the head and neck of 07/21/23. MEDICATIONS: No antibiotic was administered within 1 hour of the procedure. ANESTHESIA/SEDATION: General anesthesia. CONTRAST:  Omnipaque 300 approximately 100 mL. FLUOROSCOPY TIME:  Fluoroscopy Time: 87 minutes 48 seconds (5561 mGy). COMPLICATIONS: None immediate. TECHNIQUE: Following a full explanation of the procedure along with the potential associated complications, an informed witnessed consent was obtained. The risks of intracranial hemorrhage of 10%, worsening neurological deficit, ventilator dependency, death and inability to revascularize were all reviewed in detail with the patient's family. The patient was then put under general anesthesia by the Department of Anesthesiology at [facility]. The right forearm to the wrist was prepped and draped in the usual sterile manner. The right radial artery was identified by ultrasound, and its morphology documented in the radiology PACS system. A dorsal palmar anastomosis was verified to be present. Using ultrasound guidance access into the right radial artery was obtained with a 7 French radial sheath over an 018 inch micro guidewire. The obturator and the wire were removed. Good aspiration was obtained from the side port of the radial sheath. A cocktail of 2000 units of heparin, 200 mcg of nitroglycerin and 2.5 mg of verapamil was then infused in diluted form without event. A right radial arteriogram was then performed. Over an 035 inch guidewire, combination of a 105 7 French RIST sheath with a 125 cm Simmons 2 5.5 [place] support  catheter was advanced to the right vertebral artery region. The guidewire  was removed. Good aspiration obtained from the hub of the 5.5 [place] support catheter now in the right vertebral artery origin. An arteriogram was performed centered extra cranially and intracranially. FINDINGS: The right vertebral artery origin demonstrates patency with moderate tortuosity proximally. The vessel opacifies to the distal right vertebral artery. Over the 035 inch Roadrunner guidewire, the combination of the support catheter and of the 105 RIST sheath was advanced to the distal right vertebral artery at the level of C1. The guidewire and the support catheter were removed. Good aspiration obtained from the hub of the RIST sheath. Control arteriogram performed through this demonstrated complete occlusion of the right vertebral artery just distal to the right posterior-inferior cerebellar artery. No contrast was seen distal to this. Minimal retrograde opacification of the mid basilar artery to distal basilar artery was seen on the delayed arterial angiographic phase. No contrast was noted within the previously right vertebrobasilar artery/proximal basilar artery stent. PROCEDURE: Through the support RIST sheath, a 115 cm 5 [place] Sofia intermediate catheter was advanced with an 021 150 cm Phenom microcatheter over an 018 inch Aristotle micro guidewire with a moderate J configuration to the level of the posteroinferior cerebellar artery. The micro guidewire and the microcatheter was then gently to proximally occluded right vertebrobasilar artery. Significant resistance was evident trying to penetrate the occluded right vertebrobasilar junction. Eventually, access was obtained after trials of the micro guidewire followed by the microcatheter. The combination was advanced to the distal basilar artery. Micro guidewire was then replaced with an 014 inch 300 cm Zoom exchange micro guidewire with a moderate J configuration which was  positioned in the distal basilar artery. A 5 [place] Sofia catheter was then advanced to the distal right vertebrobasilar junction. Multiple attempts were then made to advance the intermediate catheter through the occluded stent without success. Aspiration was then performed through the 055 catheter for 2 minutes. Thereafter the 055 aspiration catheter was retrieved proximally until there was free aspiration of blood. A control arteriogram was then performed which continued to demonstrate near occlusion of the stented right vertebrobasilar junction and the proximal basilar artery with now trickle flow seen distal to this. Further attempts at aspiration were unsuccessful in advancing the catheters into the distal basilar artery. A 1.5 mm x 15 mm Gateway balloon angioplasty microcatheter was then advanced in a coaxial manner and with constant heparinized saline infusion using the rapid exchange technique to the distal end of the of 055 intermediate catheter just proximal to the occlusion. The stent was advanced with moderate resistance such that the marker was then in the mid of the stent. Control inflation was then performed using micro inflation syringe device via micro tubing to 1.5 mm where it was maintained for 2 minutes. The balloon was deflated and retrieved proximally. Control arteriogram performed through the intermediate catheter demonstrated modestly improved flow through the angioplastied segment within the stent. However, a second angioplasty had to be performed due to progressive occlusion of the stent. Following the second angioplasty, improved flow through the stented segment was seen distally opacifying the posterior cerebral arteries and the superior cerebellar arteries. This balloon was then replaced with a 2 mm x 15 mm Gateway angioplasty balloon catheter. After having prepped in the usual manner, this was then advanced to the distal right vertebrobasilar junction which was now occluded. Advancement  of this beyond the proximal portion of the stent was met with significant resistance despite the positions of the micro guidewire. A 2 mm x 15 mm angioplasty  balloon catheter was then replaced with a 154 cm 038 Socrates aspiration catheter. This too was met with significant resistance at the proximal aspect of the stent which was now reoccluded. Aspiration was carried out for 3 minutes through this obtaining a small blood clot. Control arteriogram performed through the Socrates aspiration catheter demonstrated only a sliver of contrast through the stent into the distal basilar artery. Given the persistent reocclusion of the vertebrobasilar junction and the stented portion due to hard intra seen atherosclerotic plaque, the procedure was terminated. A final control arteriogram performed through the 071 RIST guide sheath in the proximal vertebral artery continued to demonstrate patency of the vertebral artery and the vertebrobasilar junction with trickle flow through the stented segment and distally. The RIST sheath was removed. A wrist band was applied at the right radial puncture site. Distal radial pulse was verified to be present. A flat panel CT of the brain demonstrated no evidence of intracranial hemorrhage. The patient was then transferred intubated due to his medical condition to the neuro ICU. 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 PLAN: As per referring MD. Electronically Signed   By: Julieanne Cotton M.D.   On:  12:15   IR US Guide Vasc Access Right Result Date:  INDICATION: New onset right-sided weakness, speech difficulties, and decreased level of consciousness. Occluded bilateral vertebrobasilar junctions and proximal basilar artery  on CT angiogram of the head and neck. EXAM: 1. EMERGENT LARGE VESSEL OCCLUSION THROMBOLYSIS (POSTERIOR CIRCULATION) COMPARISON:  CT angiogram of the head and neck of June 26, 2023. MEDICATIONS: No antibiotic was administered within 1 hour of the procedure. ANESTHESIA/SEDATION: General anesthesia. CONTRAST:  Omnipaque 300 approximately 100 mL. FLUOROSCOPY TIME:  Fluoroscopy Time: 87 minutes 48 seconds (5561 mGy). COMPLICATIONS: None immediate. TECHNIQUE: Following a full explanation of the procedure along with the potential associated complications, an informed witnessed consent was obtained. The risks of intracranial hemorrhage of 10%, worsening neurological deficit, ventilator dependency, death and inability to revascularize were all reviewed in detail with the patient's family. The patient was then put under general anesthesia by the Department of Anesthesiology at [facility]. The right forearm to the wrist was prepped and draped in the usual sterile manner. The right radial artery was identified by ultrasound, and its morphology documented in the radiology PACS system. A dorsal palmar anastomosis was verified to be present. Using ultrasound guidance access into the right radial artery was obtained with a 7 French radial sheath over an 018 inch micro guidewire. The obturator and the wire were removed. Good aspiration was obtained from the side port of the radial sheath. A cocktail of 2000 units of heparin, 200 mcg of nitroglycerin and 2.5 mg of verapamil was then infused in diluted form without event. A right radial arteriogram was then performed. Over an 035 inch guidewire, combination of a 105 7 French RIST sheath with a 125 cm Simmons 2 5.5 [place] support catheter was advanced to the right vertebral artery region. The guidewire was removed. Good aspiration obtained from the hub of the 5.5 [place] support catheter now in the right vertebral artery origin. An arteriogram was performed centered extra  cranially and intracranially. FINDINGS: The right vertebral artery origin demonstrates patency with moderate tortuosity proximally. The vessel opacifies to the distal right vertebral artery. Over the 035 inch Roadrunner guidewire,  the combination of the support catheter and of the 105 RIST sheath was advanced to the distal right vertebral artery at the level of C1. The guidewire and the support catheter were removed. Good aspiration obtained from the hub of the RIST sheath. Control arteriogram performed through this demonstrated complete occlusion of the right vertebral artery just distal to the right posterior-inferior cerebellar artery. No contrast was seen distal to this. Minimal retrograde opacification of the mid basilar artery to distal basilar artery was seen on the delayed arterial angiographic phase. No contrast was noted within the previously right vertebrobasilar artery/proximal basilar artery stent. PROCEDURE: Through the support RIST sheath, a 115 cm 5 [place] Sofia intermediate catheter was advanced with an 021 150 cm Phenom microcatheter over an 018 inch Aristotle micro guidewire with a moderate J configuration to the level of the posteroinferior cerebellar artery. The micro guidewire and the microcatheter was then gently to proximally occluded right vertebrobasilar artery. Significant resistance was evident trying to penetrate the occluded right vertebrobasilar junction. Eventually, access was obtained after trials of the micro guidewire followed by the microcatheter. The combination was advanced to the distal basilar artery. Micro guidewire was then replaced with an 014 inch 300 cm Zoom exchange micro guidewire with a moderate J configuration which was positioned in the distal basilar artery. A 5 [place] Sofia catheter was then advanced to the distal right vertebrobasilar junction. Multiple attempts were then made to advance the intermediate catheter through the occluded stent without success.  Aspiration was then performed through the 055 catheter for 2 minutes. Thereafter the 055 aspiration catheter was retrieved proximally until there was free aspiration of blood. A control arteriogram was then performed which continued to demonstrate near occlusion of the stented right vertebrobasilar junction and the proximal basilar artery with now trickle flow seen distal to this. Further attempts at aspiration were unsuccessful in advancing the catheters into the distal basilar artery. A 1.5 mm x 15 mm Gateway balloon angioplasty microcatheter was then advanced in a coaxial manner and with constant heparinized saline infusion using the rapid exchange technique to the distal end of the of 055 intermediate catheter just proximal to the occlusion. The stent was advanced with moderate resistance such that the marker was then in the mid of the stent. Control inflation was then performed using micro inflation syringe device via micro tubing to 1.5 mm where it was maintained for 2 minutes. The balloon was deflated and retrieved proximally. Control arteriogram performed through the intermediate catheter demonstrated modestly improved flow through the angioplastied segment within the stent. However, a second angioplasty had to be performed due to progressive occlusion of the stent. Following the second angioplasty, improved flow through the stented segment was seen distally opacifying the posterior cerebral arteries and the superior cerebellar arteries. This balloon was then replaced with a 2 mm x 15 mm Gateway angioplasty balloon catheter. After having prepped in the usual manner, this was then advanced to the distal right vertebrobasilar junction which was now occluded. Advancement of this beyond the proximal portion of the stent was met with significant resistance despite the positions of the micro guidewire. A 2 mm x 15 mm angioplasty balloon catheter was then replaced with a 154 cm 038 Socrates aspiration catheter.  This too was met with significant resistance at the proximal aspect of the stent which was now reoccluded. Aspiration was carried out for 3 minutes through this obtaining a small blood clot. Control arteriogram performed through the Socrates aspiration catheter demonstrated only a sliver  of contrast through the stent into the distal basilar artery. Given the persistent reocclusion of the vertebrobasilar junction and the stented portion due to hard intra seen atherosclerotic plaque, the procedure was terminated. A final control arteriogram performed through the 071 RIST guide sheath in the proximal vertebral artery continued to demonstrate patency of the vertebral artery and the vertebrobasilar junction with trickle flow through the stented segment and distally. The RIST sheath was removed. A wrist band was applied at the right radial puncture site. Distal radial pulse was verified to be present. A flat panel CT of the brain demonstrated no evidence of intracranial hemorrhage. The patient was then transferred intubated due to his medical condition to the neuro ICU. 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 PLAN: As per referring MD. Electronically Signed   By: Julieanne Cotton M.D.   On:  12:15   IR CT Head Ltd Result Date:  INDICATION: New onset right-sided weakness, speech difficulties, and decreased level of consciousness. Occluded bilateral vertebrobasilar junctions and proximal basilar artery on CT angiogram of the head and neck. EXAM: 1. EMERGENT LARGE VESSEL OCCLUSION THROMBOLYSIS (POSTERIOR CIRCULATION) COMPARISON:  CT angiogram of the head and neck of June 26, 2023. MEDICATIONS: No antibiotic was administered within 1 hour of the procedure.  ANESTHESIA/SEDATION: General anesthesia. CONTRAST:  Omnipaque 300 approximately 100 mL. FLUOROSCOPY TIME:  Fluoroscopy Time: 87 minutes 48 seconds (5561 mGy). COMPLICATIONS: None immediate. TECHNIQUE: Following a full explanation of the procedure along with the potential associated complications, an informed witnessed consent was obtained. The risks of intracranial hemorrhage of 10%, worsening neurological deficit, ventilator dependency, death and inability to revascularize were all reviewed in detail with the patient's family. The patient was then put under general anesthesia by the Department of Anesthesiology at [facility]. The right forearm to the wrist was prepped and draped in the usual sterile manner. The right radial artery was identified by ultrasound, and its morphology documented in the radiology PACS system. A dorsal palmar anastomosis was verified to be present. Using ultrasound guidance access into the right radial artery was obtained with a 7 French radial sheath over an 018 inch micro guidewire. The obturator and the wire were removed. Good aspiration was obtained from the side port of the radial sheath. A cocktail of 2000 units of heparin, 200 mcg of nitroglycerin and 2.5 mg of verapamil was then infused in diluted form without event. A right radial arteriogram was then performed. Over an 035 inch guidewire, combination of a 105 7 French RIST sheath with a 125 cm Simmons 2 5.5 [place] support catheter was advanced to the right vertebral artery region. The guidewire was removed. Good aspiration obtained from the hub of the 5.5 [place] support catheter now in the right vertebral artery origin. An arteriogram was performed centered extra cranially and intracranially. FINDINGS: The right vertebral artery origin demonstrates patency with moderate tortuosity proximally. The vessel opacifies to the distal right vertebral artery. Over the 035 inch Roadrunner guidewire, the combination of the support  catheter and of the 105 RIST sheath was advanced to the distal right vertebral artery at the level of C1. The guidewire and the support catheter were removed. Good aspiration obtained from the hub of the RIST sheath. Control arteriogram performed through this demonstrated complete occlusion of the right vertebral artery just distal to  the right posterior-inferior cerebellar artery. No contrast was seen distal to this. Minimal retrograde opacification of the mid basilar artery to distal basilar artery was seen on the delayed arterial angiographic phase. No contrast was noted within the previously right vertebrobasilar artery/proximal basilar artery stent. PROCEDURE: Through the support RIST sheath, a 115 cm 5 [place] Sofia intermediate catheter was advanced with an 021 150 cm Phenom microcatheter over an 018 inch Aristotle micro guidewire with a moderate J configuration to the level of the posteroinferior cerebellar artery. The micro guidewire and the microcatheter was then gently to proximally occluded right vertebrobasilar artery. Significant resistance was evident trying to penetrate the occluded right vertebrobasilar junction. Eventually, access was obtained after trials of the micro guidewire followed by the microcatheter. The combination was advanced to the distal basilar artery. Micro guidewire was then replaced with an 014 inch 300 cm Zoom exchange micro guidewire with a moderate J configuration which was positioned in the distal basilar artery. A 5 [place] Sofia catheter was then advanced to the distal right vertebrobasilar junction. Multiple attempts were then made to advance the intermediate catheter through the occluded stent without success. Aspiration was then performed through the 055 catheter for 2 minutes. Thereafter the 055 aspiration catheter was retrieved proximally until there was free aspiration of blood. A control arteriogram was then performed which continued to demonstrate near occlusion of the  stented right vertebrobasilar junction and the proximal basilar artery with now trickle flow seen distal to this. Further attempts at aspiration were unsuccessful in advancing the catheters into the distal basilar artery. A 1.5 mm x 15 mm Gateway balloon angioplasty microcatheter was then advanced in a coaxial manner and with constant heparinized saline infusion using the rapid exchange technique to the distal end of the of 055 intermediate catheter just proximal to the occlusion. The stent was advanced with moderate resistance such that the marker was then in the mid of the stent. Control inflation was then performed using micro inflation syringe device via micro tubing to 1.5 mm where it was maintained for 2 minutes. The balloon was deflated and retrieved proximally. Control arteriogram performed through the intermediate catheter demonstrated modestly improved flow through the angioplastied segment within the stent. However, a second angioplasty had to be performed due to progressive occlusion of the stent. Following the second angioplasty, improved flow through the stented segment was seen distally opacifying the posterior cerebral arteries and the superior cerebellar arteries. This balloon was then replaced with a 2 mm x 15 mm Gateway angioplasty balloon catheter. After having prepped in the usual manner, this was then advanced to the distal right vertebrobasilar junction which was now occluded. Advancement of this beyond the proximal portion of the stent was met with significant resistance despite the positions of the micro guidewire. A 2 mm x 15 mm angioplasty balloon catheter was then replaced with a 154 cm 038 Socrates aspiration catheter. This too was met with significant resistance at the proximal aspect of the stent which was now reoccluded. Aspiration was carried out for 3 minutes through this obtaining a small blood clot. Control arteriogram performed through the Socrates aspiration catheter  demonstrated only a sliver of contrast through the stent into the distal basilar artery. Given the persistent reocclusion of the vertebrobasilar junction and the stented portion due to hard intra seen atherosclerotic plaque, the procedure was terminated. A final control arteriogram performed through the 071 RIST guide sheath in the proximal vertebral artery continued to demonstrate patency of the vertebral artery and the  vertebrobasilar junction with trickle flow through the stented segment and distally. The RIST sheath was removed. A wrist band was applied at the right radial puncture site. Distal radial pulse was verified to be present. A flat panel CT of the brain demonstrated no evidence of intracranial hemorrhage. The patient was then transferred intubated due to his medical condition to the neuro ICU. 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 PLAN: As per referring MD. Electronically Signed   By: Julieanne Cotton M.D.   On:  12:15   DG CHEST PORT 1 VIEW Result Date: 06/29/2023 CLINICAL DATA:  Fever.  Code stroke. EXAM: PORTABLE CHEST 1 VIEW COMPARISON:  AP chest 06/26/2023 FINDINGS: Endotracheal tube tip terminates approximately 4.5 cm above the carina, at the inferior aspect of the clavicular heads. Small caliber enteric tube descends below the diaphragm off of the inferior plane of view. Cardiac silhouette is again mildly enlarged. Mediastinal contours are within limits. Mildly decreased lung volumes. No acute airspace opacity is seen. No pleural effusion pneumothorax. No acute skeletal abnormality. IMPRESSION: 1. Endotracheal tube tip terminates approximately 4.5 cm above the carina. 2. Mildly decreased lung volumes. No acute airspace opacity.  Electronically Signed   By: Neita Garnet M.D.   On: 06/29/2023 15:37   EEG adult Result Date: 06/29/2023 Charlsie Quest, MD     06/29/2023  1:45 PM Patient Name: HAROUT SCHEURICH MRN: [id number] Epilepsy Attending: Charlsie Quest Referring Physician/Provider: Lynnell Catalan, MD Date: 06/28/2023 Duration: 23.42 mins Patient history: 63yo M with ams. EEG to evaluate for seizure Level of alertness: Awake/ lethargic AEDs during EEG study: None Technical aspects: This EEG study was done with scalp electrodes positioned according to the 10-20 International system of electrode placement. Electrical activity was reviewed with band pass filter of 1-70Hz , sensitivity of 7 uV/mm, display speed of 68mm/sec with a 60Hz  notched filter applied as appropriate. EEG data were recorded continuously and digitally stored.  Video monitoring was available and reviewed as appropriate. Description: No clear posterior dominant rhythm was seen. EEG showed continuous generalized predominantly 8Hz  alpha activity admixed with intermittent generalized 6-7hz  theta slowing, not reactive to stimulation. Hyperventilation and photic stimulation were not performed.   ABNORMALITY - Alpha coma, generalized - Intermittent slow, generalized IMPRESSION: This study is suggestive of moderate to severe diffuse encephalopathy. No seizures or epileptiform discharges were seen throughout the recording. Charlsie Quest   ECHOCARDIOGRAM COMPLETE Result Date: 06/27/2023    ECHOCARDIOGRAM REPORT   Patient Name:   KYRILLOS ADAMS Date of Exam: 06/27/2023 Medical Rec #:  [id number]      Height:       68.0 in Accession #:    [id number]     Weight:       235.0 lb Date of Birth:  06-26-1960      BSA:          2.189 m Patient Age:    62 years       BP:           122/70 mmHg Patient Gender: M              HR:           69 bpm. Exam Location:  Inpatient Procedure: 2D Echo, Intracardiac Opacification Agent, Color Doppler and Cardiac  Doppler (Both Spectral and  Color Flow Doppler were utilized during            procedure). Indications:    Stroke  History:        Patient has prior history of Echocardiogram examinations.                 Stroke; Risk Factors:Hypertension.  Sonographer:    Lamont Snowball Referring Phys: [phone] Raylie Maddison A Izzy Doubek IMPRESSIONS  1. Left ventricular ejection fraction, by estimation, is 60 to 65%. The left ventricle has normal function. The left ventricle has no regional wall motion abnormalities. There is moderate concentric left ventricular hypertrophy. Left ventricular diastolic parameters are consistent with Grade I diastolic dysfunction (impaired relaxation).  2. Right ventricular systolic function is normal. The right ventricular size is normal. Tricuspid regurgitation signal is inadequate for assessing PA pressure.  3. The mitral valve is normal in structure. No evidence of mitral valve regurgitation. No evidence of mitral stenosis.  4. The aortic valve is tricuspid. There is mild calcification of the aortic valve. Aortic valve regurgitation is not visualized. No aortic stenosis is present.  5. Aortic dilatation noted. There is borderline dilatation of the aortic root, measuring 39 mm. There is mild dilatation of the ascending aorta, measuring 42 mm.  6. The inferior vena cava is dilated in size with <50% respiratory variability, suggesting right atrial pressure of 15 mmHg. FINDINGS  Left Ventricle: Left ventricular ejection fraction, by estimation, is 60 to 65%. The left ventricle has normal function. The left ventricle has no regional wall motion abnormalities. The left ventricular internal cavity size was normal in size. There is  moderate concentric left ventricular hypertrophy. Left ventricular diastolic parameters are consistent with Grade I diastolic dysfunction (impaired relaxation). Right Ventricle: The right ventricular size is normal. No increase in right ventricular wall thickness. Right ventricular systolic function is normal.  Tricuspid regurgitation signal is inadequate for assessing PA pressure. Left Atrium: Left atrial size was normal in size. Right Atrium: Right atrial size was normal in size. Pericardium: There is no evidence of pericardial effusion. Mitral Valve: The mitral valve is normal in structure. No evidence of mitral valve regurgitation. No evidence of mitral valve stenosis. MV peak gradient, 2.6 mmHg. The mean mitral valve gradient is 2.0 mmHg. Tricuspid Valve: The tricuspid valve is normal in structure. Tricuspid valve regurgitation is trivial. No evidence of tricuspid stenosis. Aortic Valve: The aortic valve is tricuspid. There is mild calcification of the aortic valve. Aortic valve regurgitation is not visualized. No aortic stenosis is present. Aortic valve peak gradient measures 11.6 mmHg. Pulmonic Valve: The pulmonic valve was normal in structure. Pulmonic valve regurgitation is not visualized. No evidence of pulmonic stenosis. Aorta: Aortic dilatation noted. There is borderline dilatation of the aortic root, measuring 39 mm. There is mild dilatation of the ascending aorta, measuring 42 mm. Venous: The inferior vena cava is dilated in size with less than 50% respiratory variability, suggesting right atrial pressure of 15 mmHg. IAS/Shunts: No atrial level shunt detected by color flow Doppler.  LEFT VENTRICLE PLAX 2D LVIDd:         4.70 cm   Diastology LVIDs:         2.50 cm   LV e' medial:    5.11 cm/s LV PW:         1.10 cm   LV E/e' medial:  15.3 LV IVS:        1.40 cm   LV e' lateral:   10.10  cm/s LVOT diam:     2.20 cm   LV E/e' lateral: 7.7 LV SV:         86 LV SV Index:   39 LVOT Area:     3.80 cm  RIGHT VENTRICLE             IVC RV Basal diam:  4.40 cm     IVC diam: 2.40 cm RV S prime:     14.90 cm/s TAPSE (M-mode): 1.8 cm LEFT ATRIUM             Index        RIGHT ATRIUM           Index LA Vol (A2C):   39.3 ml 17.96 ml/m  RA Area:     28.90 cm LA Vol (A4C):   52.5 ml 23.99 ml/m  RA Volume:   112.00 ml 51.17  ml/m LA Biplane Vol: 46.1 ml 21.06 ml/m  AORTIC VALVE AV Area (Vmax): 2.71 cm AV Vmax:        170.00 cm/s AV Peak Grad:   11.6 mmHg LVOT Vmax:      121.00 cm/s LVOT Vmean:     77.500 cm/s LVOT VTI:       0.226 m  AORTA Ao Root diam: 3.90 cm Ao Asc diam:  4.20 cm MITRAL VALVE MV Area (PHT): 2.85 cm    SHUNTS MV Area VTI:   3.53 cm    Systemic VTI:  0.23 m MV Peak grad:  2.6 mmHg    Systemic Diam: 2.20 cm MV Mean grad:  2.0 mmHg MV Vmax:       0.81 m/s MV Vmean:      59.7 cm/s MV Decel Time: 266 msec MV E velocity: 78.00 cm/s MV A velocity: 82.70 cm/s MV E/A ratio:  0.94 Arvilla Meres MD Electronically signed by Arvilla Meres MD Signature Date/Time: 06/27/2023/10:34:20 AM    Final    DG Abd Portable 1V Result Date: 06/27/2023 CLINICAL DATA:  Nasogastric tube placement. EXAM: PORTABLE ABDOMEN - 1 VIEW COMPARISON:  None Available. FINDINGS: Distal tip of nasogastric tube is seen in expected position of proximal stomach. IMPRESSION: Distal tip of nasogastric tube seen in expected position of proximal stomach. Electronically Signed   By: Lupita Raider M.D.   On: 06/27/2023 10:14   MR BRAIN WO CONTRAST Result Date: 06/27/2023 CLINICAL DATA:  Stroke follow-up EXAM: MRI HEAD WITHOUT CONTRAST TECHNIQUE: Multiplanar, multiecho pulse sequences of the brain and surrounding structures were obtained without intravenous contrast. COMPARISON:  04/11/2023, 06/26/2022 FINDINGS: Brain: There are acute/early subacute infarcts of both cerebellar hemispheres, both pontine hemispheres and the right occipital lobe. Chronic blood products at the right cerebral peduncle and the left occipital lobe. Normal volume of CSF spaces. There is multifocal hyperintense T2-weighted signal within the periventricular and deep white matter. No chronic microhemorrhage. Normal midline structures. Old right MCA territory infarct. Vascular: Loss of the normal flow void of the left V4 segment and the basilar artery. Skull and upper cervical  spine: Normal calvarium and skull base. Visualized upper cervical spine and soft tissues are normal. Sinuses/Orbits:No paranasal sinus fluid levels or advanced mucosal thickening. No mastoid or middle ear effusion. Normal orbits. IMPRESSION: 1. 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 2. Loss of the normal flow void of the left V4 segment and the basilar artery, consistent with occlusion. Electronically Signed   By: Deatra Robinson M.D.   On: 06/27/2023 04:07  Portable Chest x-ray Result Date: 06/27/2023 CLINICAL DATA:  Intubated EXAM: PORTABLE CHEST 1 VIEW COMPARISON:  07/08/2016 FINDINGS: Endotracheal tube tip is about 3.5 cm superior to the carina. Low lung volumes. Mild cardiomegaly. No pleural effusion or pneumothorax IMPRESSION: Endotracheal tube tip about 3.5 cm superior to the carina. Low lung volumes. Mild cardiomegaly. Electronically Signed   By: Jasmine Pang M.D.   On: 06/27/2023 00:03   CT ANGIO HEAD NECK W WO CM (CODE STROKE) Result Date: 06/26/2023 CLINICAL DATA:  Code stroke, right arm weakness, gait instability, vision changes. EXAM: CT ANGIOGRAPHY HEAD AND NECK WITH AND WITHOUT CONTRAST TECHNIQUE: Multidetector CT imaging of the head and neck was performed using the standard protocol during bolus administration of intravenous contrast. Multiplanar CT image reconstructions and MIPs were obtained to evaluate the vascular anatomy. Carotid stenosis measurements (when applicable) are obtained utilizing NASCET criteria, using the distal internal carotid diameter as the denominator. RADIATION DOSE REDUCTION: This exam was performed according to the departmental dose-optimization program which includes automated exposure control, adjustment of the mA and/or kV according to patient size and/or use of iterative reconstruction technique. CONTRAST:  85mL OMNIPAQUE IOHEXOL 350 MG/ML SOLN COMPARISON:  Same-day head CT.  CTA head and  neck 04/01/2023. FINDINGS: CTA NECK FINDINGS Aortic arch: Standard configuration of the aortic arch. Imaged portion shows no evidence of aneurysm or dissection. No significant stenosis of the major arch vessel origins. Pulmonary arteries: As permitted by contrast timing, there are no filling defects in the visualized pulmonary arteries. Subclavian arteries: The subclavian arteries are patent bilaterally. Right carotid system: Patent. Noncalcified atherosclerotic plaque in the distal common carotid artery extending to the bifurcation and proximal cervical ICA. Mild atherosclerosis at the carotid bifurcation without hemodynamically significant stenosis. No evidence of dissection. Tortuosity of the mid and distal cervical ICA. Left carotid system: No evidence of dissection, stenosis (50% or greater), or occlusion. Minimal atherosclerosis at the carotid bifurcation. Mild tortuosity of the distal cervical ICA. Vertebral arteries: The right vertebral artery is patent from its origin to the proximal V4 segment. Atherosclerosis along the V4 segment resulting in moderate stenosis similar to prior. There is occlusion of the distal right V4 segment with suggestion of possible intraluminal soft tissue concerning for intraluminal thrombus. The left vertebral artery is occluded from its origin to the intracranial segment which is similar to prior. Skeleton: No acute findings. Degenerative changes in the cervical spine. Prominent anterior endplate osteophytes at multiple levels in the cervical spine. Other neck: The visualized airway is patent. No cervical lymphadenopathy. Upper chest: Visualized lung apices are clear. Review of the MIP images confirms the above findings CTA HEAD FINDINGS ANTERIOR CIRCULATION: The intracranial ICAs are patent bilaterally. Atherosclerosis of the bilateral carotid siphons. Similar moderate stenosis of the right supraclinoid ICA. Mild stenosis of the left supraclinoid ICA. No proximal occlusion,  aneurysm, or vascular malformation. MCAs: The middle cerebral arteries are patent bilaterally. ACAs: The anterior cerebral arteries are patent bilaterally. POSTERIOR CIRCULATION: PCAs: The posterior cerebral arteries are patent bilaterally. Pcomm: Small posterior communicating artery visualized on the left. SCAs: Patent bilaterally. There is focal severe stenosis of the distal left superior cerebellar artery. Thrombus within the basilar artery possibly involves the origin of the left superior cerebellar artery. Basilar artery: The proximal basilar artery is occluded. Stent noted within the basilar artery which may be occluded as well. Irregular soft tissue within the mid and distal basilar artery along the left lateral wall concerning for mural a shin or thrombus. Soft tissue extends  superiorly to the level of the superior cerebellar arteries. AICAs: Not well visualized. PICAs: Visualized on the right. Diminutive left PICA branch is noted. Venous sinuses: As permitted by contrast timing, patent. Anatomic variants: None Review of the MIP images confirms the above findings IMPRESSION: 1. 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 2. Occlusion of the proximal basilar artery. Stent within the basilar artery is likely occluded. Irregular soft tissue within the mid and distal basilar artery along the left lateral wall concerning for mural thrombus. Soft tissue extends superiorly to the level of the superior cerebellar arteries. 3. Thrombus likely involves the origin of the left superior cerebellar artery. 4. 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 5. Occlusion of the left vertebral artery from its origin to the intracranial segment, similar to prior. 6. Multifocal atherosclerosis as above. Moderate stenosis of the right supraclinoid ICA again noted. These results were communicated to Dr. Selina Cooley At 5:40 Pm  on 06/26/2023 by phone call. Electronically Signed   By: Emily Filbert M.D.   On: 06/26/2023 18:24   CT HEAD CODE STROKE WO CONTRAST Result Date: 06/26/2023 CLINICAL DATA:  Code stroke. Code stroke, right arm weakness, history of prior basilar artery stent. Unsteady gait and vision changes. EXAM: CT HEAD WITHOUT CONTRAST TECHNIQUE: Contiguous axial images were obtained from the base of the skull through the vertex without intravenous contrast. RADIATION DOSE REDUCTION: This exam was performed according to the departmental dose-optimization program which includes automated exposure control, adjustment of the mA and/or kV according to patient size and/or use of iterative reconstruction technique. COMPARISON:  CT head 04/11/2023. FINDINGS: Brain: No acute intracranial hemorrhage. Hypoattenuation in the left cerebellum within the superior cerebellar artery territory concerning for acute versus subacute infarct. There are no additional areas concerning for infarct in the supratentorial parenchyma. No edema, mass effect, or midline shift. The basilar cisterns are patent. Ventricles: The ventricles are normal. Vascular: Atherosclerotic calcifications of the carotid siphons and intracranial vertebral arteries. No hyperdense vessel. Skull: No acute or aggressive finding. Orbits: Orbits are symmetric. Sinuses: The visualized paranasal sinuses are clear. Other: Mastoid air cells are clear. ASPECTS [facility] Stroke Program Early CT Score) - Ganglionic level infarction (caudate, lentiform nuclei, internal capsule, insula, M1-M3 cortex): 7 - Supraganglionic infarction (M4-M6 cortex): 3 Total score (0-10 with 10 being normal): 10 IMPRESSION: 1. Hypoattenuation in the left cerebellum within the superior cerebellar artery territory concerning for acute versus subacute infarct. 2. No acute intracranial hemorrhage. 3. Aspects is 10. These results were communicated to Dr. Selina Cooley At 5:55 pm on 06/26/2023 by text page via the [facility]  messaging system. Electronically Signed   By: Emily Filbert M.D.   On: 06/26/2023 17:56    Microbiology Recent Results (from the past 240 hours)  Resp panel by RT-PCR (RSV, Flu A&B, Covid) Anterior Nasal Swab     Status: None   Collection Time: 06/26/23  5:59 PM   Specimen: Anterior Nasal Swab  Result Value Ref Range Status   SARS Coronavirus 2 by RT PCR NEGATIVE NEGATIVE Final   Influenza A by PCR NEGATIVE NEGATIVE Final   Influenza B by PCR NEGATIVE NEGATIVE Final    Comment: (NOTE) The Xpert Xpress SARS-CoV-2/FLU/RSV plus assay is intended as an aid in the diagnosis of influenza from Nasopharyngeal swab specimens and should not be used as a sole basis for treatment. Nasal washings and aspirates are unacceptable for Xpert Xpress SARS-CoV-2/FLU/RSV testing.  Fact Sheet for Patients: BloggerCourse.com  Fact Sheet for Healthcare Providers: SeriousBroker.it  This test is not yet approved or cleared by the [place] FDA and has been authorized for detection and/or diagnosis of SARS-CoV-2 by FDA under an Emergency Use Authorization (EUA). This EUA will remain in effect (meaning this test can be used) for the duration of the COVID-19 declaration under Section 564(b)(1) of the Act, 21 U.S.C. section 360bbb-3(b)(1), unless the authorization is terminated or revoked.     Resp Syncytial Virus by PCR NEGATIVE NEGATIVE Final    Comment: (NOTE) Fact Sheet for Patients: BloggerCourse.com  Fact Sheet for Healthcare Providers: SeriousBroker.it  This test is not yet approved or cleared by the [place] FDA and has been authorized for detection and/or diagnosis of SARS-CoV-2 by FDA under an Emergency Use Authorization (EUA). This EUA will remain in effect (meaning this test can be used) for the duration of the COVID-19 declaration under Section 564(b)(1) of the Act, 21  U.S.C. section 360bbb-3(b)(1), unless the authorization is terminated or revoked.  Performed at [facility], [address], [place], [place] [address]   MRSA Next Gen by PCR, Nasal     Status: None   Collection Time: 06/26/23 11:07 PM   Specimen: Nasal Mucosa; Nasal Swab  Result Value Ref Range Status   MRSA by PCR Next Gen NOT DETECTED NOT DETECTED Final    Comment: (NOTE) The GeneXpert MRSA Assay (FDA approved for NASAL specimens only), is one component of a comprehensive MRSA colonization surveillance program. It is not intended to diagnose MRSA infection nor to guide or monitor treatment for MRSA infections. Test performance is not FDA approved in patients less than 22 years old. Performed at [facility], [address], [place], [place] [address]     Lab Basic Metabolic Panel: Recent Labs  Lab 06/26/23 1715 06/26/23 1734 06/26/23 2252 06/27/23 0527 06/27/23 1951 06/28/23 0735 06/29/23 0506  NA 140 139 139 135  --  138 138  K 4.0 4.0 3.9 4.1  --  3.3* 4.5  CL 103 102  --  100  --  105 105  CO2 31  --   --  26  --  27 24  GLUCOSE 109* 107*  --  220*  --  152* 154*  BUN 18 19  --  19  --  25* 27*  CREATININE 1.23 1.30*  --  1.21  --  1.17 1.02  CALCIUM 8.6*  --   --  8.1*  --  7.4* 7.7*  MG  --   --   --  1.8  --  2.2 2.2  PHOS  --   --   --  1.6* 3.5 2.7 2.9   Liver Function Tests: Recent Labs  Lab 06/26/23 1715  AST 19  ALT 12  ALKPHOS 57  BILITOT 0.4  PROT 7.4  ALBUMIN 3.6   No results for input(s): "LIPASE", "AMYLASE" in the last 168 hours. No results for input(s): "AMMONIA" in the last 168 hours. CBC: Recent Labs  Lab 06/26/23 1715 06/26/23 1734 06/26/23 2252 06/27/23 0527 06/28/23 0735 06/29/23 0506  WBC 7.1  --   --  7.0 10.1 8.1  NEUTROABS 3.5  --   --  6.1  --   --   HGB 9.9* 11.6* 11.2* 10.1* 9.0* 9.3*  HCT 33.9* 34.0* 33.0* 34.3* 29.9* 32.5*  MCV 77.6*  --   --  76.6* 74.4* 78.9*  PLT 357  --   --  340 314 146*    Cardiac Enzymes: No results for input(s): "CKTOTAL", "CKMB", "CKMBINDEX", "TROPONINI" in the last 168 hours. 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

## 2023-07-15 NOTE — Consult Note (Signed)
 Consultation Note Date: 07/03/23   Patient Name: Gavin Dorsey  DOB: 03-22-1961  MRN: [id number]  Age / Sex: 63 y.o., male  PCP: Simonne Martinet, MD Referring Physician: Stroke, Md, MD  Reason for Consultation: Establishing goals of care, "GOC discussion for patient with bilateral cerebellar and pontine strokes, will likely have significant disability. thrombectomy attempted but unsucessful "  HPI/Patient Profile: 63 y.o. 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  After discussions with CCM, family have opted for patient's transition to full comfort care on 3/16.  Of note patient has had 3 admissions and 1 ED visit in the last 6 months.  Clinical Assessment and Goals of Care: I have reviewed medical records including EPIC notes, labs, any available advanced directives, and imaging. Received report from primary RN - no acute concerns.   Went to visit patient at bedside - noo family/visitors present. Patient was lying in bed unresponsive. No signs or non-verbal gestures of pain or discomfort noted. No respiratory distress or increased work of breathing; secretions noted.  Continuous morphine infusion running at 15 mg/h.  Met with cousin/Ginger via phone  to discuss diagnosis, prognosis, GOC, EOL wishes, disposition, and options.  I introduced Palliative Medicine as specialized medical care for people living with serious illness. It focuses on providing relief from the symptoms and stress of a serious illness. The goal is to improve quality of life for both the patient and the family.  Ginger has a clear understanding of patient's current acute medical situation and she confirms goal is for focus on comfort/quality of life with full comfort measures for  end-of-life care.  She states that family are traveling and will be visiting with patient this afternoon.  Anticipate patient will be hospital death.  Visit also consisted of discussions dealing with the complex and emotionally intense issues of symptom management and palliative care in the setting of serious and potentially life-threatening illness.   Discussed with family the importance of continued conversation with each other and the medical providers regarding overall plan of care and treatment options, ensuring decisions are within the context of the patient's values and GOCs.    Questions and concerns were addressed. The patient/family was encouraged to call with questions and/or concerns. PMT number was provided.   Primary Decision Maker: NEXT OF KIN - cousin/Ginger Delpriore    SUMMARY OF RECOMMENDATIONS   Continue full comfort measures Continue DNR/DNI as previously documented Anticipate hospital death Added orders for EOL symptom management and to reflect full comfort measures, as well as discontinued orders that were not focused on comfort Unrestricted visitation orders were placed per current [facility] EOL visitation policy  Nursing to provide frequent assessments and administer PRN medications as clinically necessary to ensure EOL comfort PMT will continue to follow and support holistically  Symptom Management Continuous morphine infusion; continue PRN doses for breakthrough pain/dyspnea/increased work of breathing/RR>25 Tylenol PRN pain/fever Scheduled Robinul q6h; continue PRN doses for secretions Ativan PRN anxiety/seizure/sleep/distress Zofran PRN nausea/vomiting Liquifilm Tears PRN dry eye   Code Status/Advance Care Planning: DNR  Palliative Prophylaxis:  Aspiration, Delirium Protocol, Frequent Pain Assessment, Oral Care, and Turn Reposition  Additional Recommendations (Limitations, Scope, Preferences): Full Comfort Care  Psycho-social/Spiritual:  Desire  for further Chaplaincy support:no Created space and opportunity for patient and family to express thoughts and feelings regarding patient's current  medical situation.  Emotional support and therapeutic listening provided.  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 The following aspects are pertinent.  Past Medical History:  Diagnosis Date   Diabetes mellitus without complication (HCC)    Hypertension    Social History   Socioeconomic History   Marital status: Single    Spouse name: Not on file   Number of children: Not on file   Years of education: Not on file   Highest education level: Not on file  Occupational History   Not on file  Tobacco Use   Smoking status: Never   Smokeless tobacco: Never  Substance and Sexual Activity   Alcohol use: Yes   Drug use: Never   Sexual activity: Yes  Other Topics Concern   Not on file  Social History Narrative   Not on file   Social Drivers of Health   Financial Resource Strain: Not on file  Food Insecurity: Patient Unable To Answer (06/27/2023)   Hunger Vital Sign    Worried About Running Out of Food in the Last Year: Patient unable to answer    Ran Out of Food in the Last Year: Patient unable to answer  Transportation Needs: Patient Unable To Answer (06/27/2023)   PRAPARE - Transportation    Lack of Transportation (Medical): Patient unable to answer    Lack of Transportation (Non-Medical): Patient unable to answer  Physical Activity: Not on file  Stress: Not on file  Social Connections: Not on file   No family history on file. Scheduled Meds:  pantoprazole (PROTONIX) IV  40 mg Intravenous QHS   Continuous Infusions:  morphine 15 mg/hr (2023-07-20 0916)   PRN  Meds:.acetaminophen **OR** acetaminophen, glycopyrrolate **OR** glycopyrrolate **OR** glycopyrrolate, LORazepam, morphine, mouth rinse, polyvinyl alcohol, senna-docusate Medications Prior to Admission:  Prior to Admission medications   Medication Sig Start Date End Date Taking? Authorizing Provider  aspirin EC 81 MG tablet Take 81 mg by mouth daily. Swallow whole.    [provider]  clopidogrel (PLAVIX) 75 MG tablet Take 1 tablet (75 mg total) by mouth daily. 04/16/23   Leroy Sea, MD  diazepam (VALIUM) 5 MG tablet Take 5 mg by mouth 2 (two) times daily as needed. 05/29/23   [provider]  pantoprazole (PROTONIX) 40 MG tablet Take 1 tablet (40 mg total) by mouth 2 (two) times daily before a meal. 04/16/23   Leroy Sea, MD  rosuvastatin (CRESTOR) 40 MG tablet Take 1 tablet (40 mg total) by mouth daily. 04/16/23   Leroy Sea, MD  sucralfate (CARAFATE) 1 GM/10ML suspension Take 10 mLs (1 g total) by mouth 4 (four) times daily -  with meals and at bedtime. 04/16/23   Leroy Sea, MD   Allergies  Allergen Reactions   Brilinta [Ticagrelor] Other (See Comments)    Caused internal bleeding   Review of Systems  Unable to perform ROS: Acuity of condition    Physical Exam Vitals and nursing note reviewed.  Constitutional:      General: He is not in acute distress.    Appearance: He is ill-appearing.  Pulmonary:     Effort: No respiratory distress.  Skin:    General: Skin is warm and dry.  Neurological:  Mental Status: He is unresponsive.     Motor: Weakness present.     Vital Signs: BP 116/64   Pulse (!) 107   Temp 99.8 F (37.7 C) (Axillary)   Resp 15   Ht 5\' 8"  (1.727 m)   Wt 123.9 kg   SpO2 (!) 82%   BMI 41.53 kg/m  Pain Scale: CPOT       SpO2: SpO2: (!) 82 % O2 Device:SpO2: (!) 82 % O2 Flow Rate: .   IO: Intake/output summary:  Intake/Output Summary (Last 24 hours) at 2023-07-14 1011 Last data filed at 14-Jul-2023 0700 Gross per  24 hour  Intake 436.64 ml  Output 195 ml  Net 241.64 ml    LBM: Last BM Date :  (PTA) Baseline Weight: Weight: 106.6 kg (from Jan 2025 records) Most recent weight: Weight: 123.9 kg     Palliative Assessment/Data: PPS 10%     Time In: 1000 Time Out: 1115 Time Total: 75 minutes  Signed by: Haskel Khan, NP   Please contact Palliative Medicine Team phone at [phone] for questions and concerns.  For individual provider: See Amion  *Portions of this note are a verbal dictation therefore any spelling and/or grammatical errors are due to the "[employer] One" system interpretation.

## 2023-07-15 NOTE — Progress Notes (Deleted)
 STROKE TEAM PROGRESS NOTE    SIGNIFICANT HOSPITAL EVENTS 3/13: patient admitted with unsteady gait and visual changes, found to have repeat terminal right vertebral artery and basilar artery stent occlusion, thrombectomy attempted but was unsuccessful.  Patient remains intubated  INTERIM HISTORY/SUBJECTIVE No family at the bedside.  Family opted for comfort measures and was compassionately extubated yesterday. He is on morphine gtt and appears comfortable.   Will transfer to 6N today   OBJECTIVE CBC    Component Value Date/Time   WBC 8.1 06/29/2023 0506   RBC 4.12 (L) 06/29/2023 0506   HGB 9.3 (L) 06/29/2023 0506   HGB 15.9 07/27/2011 1426   HCT 32.5 (L) 06/29/2023 0506   HCT 54.3 (H) 03/06/2023 1449   PLT 146 (L) 06/29/2023 0506   PLT 240 07/27/2011 1426   MCV 78.9 (L) 06/29/2023 0506   MCV 88 07/27/2011 1426   MCH 22.6 (L) 06/29/2023 0506   MCHC 28.6 (L) 06/29/2023 0506   RDW 18.9 (H) 06/29/2023 0506   RDW 13.5 07/27/2011 1426   LYMPHSABS 0.7 06/27/2023 0527   MONOABS 0.1 06/27/2023 0527   EOSABS 0.0 06/27/2023 0527   BASOSABS 0.0 06/27/2023 0527   BMET    Component Value Date/Time   NA 138 06/29/2023 0506   NA 139 07/27/2011 1426   K 4.5 06/29/2023 0506   K 3.6 07/27/2011 1426   CL 105 06/29/2023 0506   CL 104 07/27/2011 1426   CO2 24 06/29/2023 0506   CO2 27 07/27/2011 1426   GLUCOSE 154 (H) 06/29/2023 0506   GLUCOSE 123 (H) 07/27/2011 1426   BUN 27 (H) 06/29/2023 0506   BUN 17 07/27/2011 1426   CREATININE 1.02 06/29/2023 0506   CREATININE 1.27 07/27/2011 1426   CALCIUM 7.7 (L) 06/29/2023 0506   CALCIUM 8.3 (L) 07/27/2011 1426   GFRNONAA >60 06/29/2023 0506   GFRNONAA >60 07/27/2011 1426   IMAGING past 24 hours DG CHEST PORT 1 VIEW Result Date: 06/29/2023 CLINICAL DATA:  Fever.  Code stroke. EXAM: PORTABLE CHEST 1 VIEW COMPARISON:  AP chest 06/26/2023 FINDINGS: Endotracheal tube tip terminates approximately 4.5 cm above the carina, at the inferior aspect  of the clavicular heads. Small caliber enteric tube descends below the diaphragm off of the inferior plane of view. Cardiac silhouette is again mildly enlarged. Mediastinal contours are within limits. Mildly decreased lung volumes. No acute airspace opacity is seen. No pleural effusion pneumothorax. No acute skeletal abnormality. IMPRESSION: 1. Endotracheal tube tip terminates approximately 4.5 cm above the carina. 2. Mildly decreased lung volumes. No acute airspace opacity. Electronically Signed   By: Neita Garnet M.D.   On: 06/29/2023 15:37   EEG adult Result Date: 06/29/2023 Charlsie Quest, MD     06/29/2023  1:45 PM Patient Name: Gavin Dorsey MRN: [id number] Epilepsy Attending: Charlsie Quest Referring Physician/Provider: Lynnell Catalan, MD Date: 06/28/2023 Duration: 23.42 mins Patient history: 63yo M with ams. EEG to evaluate for seizure Level of alertness: Awake/ lethargic AEDs during EEG study: None Technical aspects: This EEG study was done with scalp electrodes positioned according to the 10-20 International system of electrode placement. Electrical activity was reviewed with band pass filter of 1-70Hz , sensitivity of 7 uV/mm, display speed of 42mm/sec with a 60Hz  notched filter applied as appropriate. EEG data were recorded continuously and digitally stored.  Video monitoring was available and reviewed as appropriate. Description: No clear posterior dominant rhythm was seen. EEG showed continuous generalized predominantly 8Hz  alpha activity admixed with intermittent generalized 6-7hz   theta slowing, not reactive to stimulation. Hyperventilation and photic stimulation were not performed.   ABNORMALITY - Alpha coma, generalized - Intermittent slow, generalized IMPRESSION: This study is suggestive of moderate to severe diffuse encephalopathy. No seizures or epileptiform discharges were seen throughout the recording. Priyanka Annabelle Harman    Vitals:    0600  0700  0800   0900  BP: 97/62 111/63 116/64 116/74  Pulse: (!) 110 (!) 107 (!) 107 (!) 108  Resp: 12 14 15 20   Temp:      TempSrc:      SpO2: (!) 83% (!) 82% (!) 82% (!) 80%  Weight:      Height:       PHYSICAL EXAM General: critically ill  Psych:  Mood and affect appropriate for situation CV: Regular rate and rhythm on monitor Respiratory: not labored on room air  GI: Abdomen soft and nontender  NEURO:  Mental Status: lethargic.  Appears to be comfortable Eyes are closed does not open eyes to voice.  Most Recent NIH  1a Level of Conscious.: 2 1b LOC Questions: 2 1c LOC Commands: 2 2 Best Gaze: 2 3 Visual: 0 4 Facial Palsy: 0 5a Motor Arm - left: 3 5b Motor Arm - Right: 3 6a Motor Leg - Left: 3 6b Motor Leg - Right: 4 7 Limb Ataxia: 0 8 Sensory: 0 9 Best Language: 3 10 Dysarthria: 2 11 Extinct. and Inatten.: 0 TOTAL: 26  ASSESSMENT/PLAN  Gavin Dorsey is a 63 y.o. male with history of stroke with basilar stent placement, hypertension, diabetes and obesity who presented for evaluation of unsteady gait and vision changes.  Initial CT revealed hypoattenuation in the left cerebellum concerning for stroke, and CT angiogram revealed repeat basilar artery occlusion.  Patient was taken to interventional radiology for thrombectomy, but this was unsuccessful.  He remains intubated after the procedure.  MRI reveals infarcts in bilateral cerebellar hemispheres, bilateral pontine hemispheres and in right occipital lobe.  Patient will likely have significant disability after these infarcts, so we will consult palliative care for discussion of goals of care with family.  NIH on Admission 5  Stroke: Bilateral cerebellar R>L, bilateral pontine L>R, and right PCA punctate infarcts, etiology: Reocclusion of R VA and BA stent   Code Stroke CT head hypoattenuation in left cerebellar hemisphere in SCA territory ASPECTS 10.    CTA head & neck occlusion of distal right V4 segment, occlusion of proximal BA  with occlusion of stent, thrombus likely involving origin of left SCA, severe stenosis of distal L SCA, occlusion of left VA MRI acute infarcts of bilateral cerebellar hemispheres, both pontine hemispheres and right occipital lobe 2D Echo EF 60 to 65%, mild concentric LVH, grade 1 diastolic dysfunction, aortic dilatation, normal left atrial size, no atrial level shunt LDL 107 HgbA1c 6.1 P2 Y12 = 257 VTE prophylaxis -Lovenox aspirin 81 mg daily and clopidogrel 75 mg daily prior to admission, now on aspirin 81 mg daily and clopidogrel 75 mg daily. Therapy recommendations:  SNF Disposition: Pending, transitioned to comfort   Hx of Stroke/TIA 04/02/2023 admitted for brainstem and left cerebellar infarct.  CT head and neck and cerebral angio showed left VA chronic occlusion, right VA new occlusion and proximal BA occlusion.  Status post stenting.  EF 55 to 60%.  LDL 10, A1c 5.2.  Discharged on aspirin and Brilinta. 04/14/2023 admitted for right cerebral peduncle ICH and GI bleeding.  Brilinta was discontinued.  Patient discharged on aspirin and Plavix  Hypertension  Home meds: None Stable Blood Pressure Goal: SBP 120-160 for first 24 hours then less than 180/105   Hyperlipidemia Home meds: Rosuvastatin 40 mg daily, resumed in hospital LDL 107, goal < 70 Will add zetia if aggressive management Continue statin at discharge  Diabetes type II Controlled Home meds: None HgbA1c 6.1, goal < 7.0 CBGs SSI Recommend close follow-up with PCP for better DM control  Respiratory failure ?  Pneumonia Patient left intubated after attempted thrombectomy Ventilator management per CCM Wean as tolerated Extubate when able Fever Tmax 101.3, no leukocytosis  Dysphagia Now NPO On TF now @ 35  Other Stroke Risk Factors Obesity, Body mass index is 41.53 kg/m., BMI >/= 30 associated with increased stroke risk, recommend weight loss, diet and exercise as appropriate   Other Active Problems Anemia,  hemoglobin 10.1--9.0--9.3 Thrombocytopenia, platelet 340--314--146 Goals of care discussions pending with family Patient likely to have significant neurologic disability and will likely require trach/PEG  Pending palliative care GOC discussion when family today Hypokalemia K 3.3--4.5  Hospital day # 4  Gevena Mart DNP, ACNPC-AG  [facility]  To contact Stroke Continuity provider, please refer to [email or web address]. After hours, contact General Neurology

## 2023-07-15 NOTE — Progress Notes (Signed)
 STROKE TEAM PROGRESS NOTE    SIGNIFICANT HOSPITAL EVENTS 3/13: patient admitted with unsteady gait and visual changes, found to have repeat terminal right vertebral artery and basilar artery stent occlusion, thrombectomy attempted but was unsuccessful.  Patient remains intubated  INTERIM HISTORY/SUBJECTIVE Patient in comfort care now. Pt on morphine drip.   OBJECTIVE CBC    Component Value Date/Time   WBC 8.1 06/29/2023 0506   RBC 4.12 (L) 06/29/2023 0506   HGB 9.3 (L) 06/29/2023 0506   HGB 15.9 07/27/2011 1426   HCT 32.5 (L) 06/29/2023 0506   HCT 54.3 (H) 03/06/2023 1449   PLT 146 (L) 06/29/2023 0506   PLT 240 07/27/2011 1426   MCV 78.9 (L) 06/29/2023 0506   MCV 88 07/27/2011 1426   MCH 22.6 (L) 06/29/2023 0506   MCHC 28.6 (L) 06/29/2023 0506   RDW 18.9 (H) 06/29/2023 0506   RDW 13.5 07/27/2011 1426   LYMPHSABS 0.7 06/27/2023 0527   MONOABS 0.1 06/27/2023 0527   EOSABS 0.0 06/27/2023 0527   BASOSABS 0.0 06/27/2023 0527   BMET    Component Value Date/Time   NA 138 06/29/2023 0506   NA 139 07/27/2011 1426   K 4.5 06/29/2023 0506   K 3.6 07/27/2011 1426   CL 105 06/29/2023 0506   CL 104 07/27/2011 1426   CO2 24 06/29/2023 0506   CO2 27 07/27/2011 1426   GLUCOSE 154 (H) 06/29/2023 0506   GLUCOSE 123 (H) 07/27/2011 1426   BUN 27 (H) 06/29/2023 0506   BUN 17 07/27/2011 1426   CREATININE 1.02 06/29/2023 0506   CREATININE 1.27 07/27/2011 1426   CALCIUM 7.7 (L) 06/29/2023 0506   CALCIUM 8.3 (L) 07/27/2011 1426   GFRNONAA >60 06/29/2023 0506   GFRNONAA >60 07/27/2011 1426   IMAGING past 24 hours No results found.  Vitals:    1000  1100  1200  1300  BP:      Pulse: (!) 113 (!) 111 (!) 120 92  Resp: 17 15 (!) 25 18  Temp:      TempSrc:      SpO2: (!) 80% (!) 76% (!) 69% (!) 65%  Weight:      Height:       PHYSICAL EXAM Limited exam due to comfort care. Pt mildly tachypnea, unresponsive, not moving extremities.    ASSESSMENT/PLAN  Gavin Dorsey is a 63 y.o. male with history of stroke with basilar stent placement, hypertension, diabetes and obesity who presented for evaluation of unsteady gait and vision changes.  Initial CT revealed hypoattenuation in the left cerebellum concerning for stroke, and CT angiogram revealed repeat basilar artery occlusion.  Patient was taken to interventional radiology for thrombectomy, but this was unsuccessful.  He remains intubated after the procedure.  MRI reveals infarcts in bilateral cerebellar hemispheres, bilateral pontine hemispheres and in right occipital lobe.  Patient will likely have significant disability after these infarcts, so we will consult palliative care for discussion of goals of care with family.  NIH on Admission 5  Stroke: Bilateral cerebellar R>L, bilateral pontine L>R, and right PCA punctate infarcts, etiology: Reocclusion of R VA and BA stent   Code Stroke CT head hypoattenuation in left cerebellar hemisphere in SCA territory ASPECTS 10.    CTA head & neck occlusion of distal right V4 segment, occlusion of proximal BA with occlusion of stent, thrombus likely involving origin of left SCA, severe stenosis of distal L SCA, occlusion of left VA MRI acute infarcts of bilateral cerebellar hemispheres, both  pontine hemispheres and right occipital lobe 2D Echo EF 60 to 65%, mild concentric LVH, grade 1 diastolic dysfunction, aortic dilatation, normal left atrial size, no atrial level shunt LDL 107 HgbA1c 6.1 P2 Y12 = 257 VTE prophylaxis -Lovenox aspirin 81 mg daily and clopidogrel 75 mg daily prior to admission, now on morphine drip for comfort care  Hx of Stroke/TIA 04/02/2023 admitted for brainstem and left cerebellar infarct.  CT head and neck and cerebral angio showed left VA chronic occlusion, right VA new occlusion and proximal BA occlusion.  Status post stenting.  EF 55 to 60%.  LDL 10, A1c 5.2.  Discharged on aspirin and Brilinta. 04/14/2023  admitted for right cerebral peduncle ICH and GI bleeding.  Brilinta was discontinued.  Patient discharged on aspirin and Plavix  Hypertension Home meds: None Stable now   Hyperlipidemia Home meds: Rosuvastatin 40 mg daily LDL 107, goal < 70  Diabetes type II Controlled Home meds: None HgbA1c 6.1, goal < 7.0  Respiratory failure ?  Pneumonia Patient left intubated after attempted thrombectomy Ventilator management per CCM Extubate for comfort care 3/16  Dysphagia NPO  Other Stroke Risk Factors Obesity, Body mass index is 41.53 kg/m., BMI >/= 30 associated with increased stroke risk, recommend weight loss, diet and exercise as appropriate   Other Active Problems Anemia, hemoglobin 10.1--9.0--9.3 Thrombocytopenia, platelet 340--314--146 Hypokalemia, K 3.3--4.5  Hospital day # 4   Marvel Plan, MD PhD Stroke Neurology  2:22 PM   To contact Stroke Continuity provider, please refer to [email or web address]. After hours, contact General Neurology

## 2023-07-15 NOTE — Progress Notes (Signed)
 Critical care progress  EEG consistent with encephalopathy but no seizures.   Family has conferred 3/16 and patient was transitioned to comfort care based on prior discussion with Dr Pearlean Brownie pointing to severe long-term disability with need for feeding tube and tracheostomy.   Code status changed to DNR comfort care and orders entered.  Pt. Was extubated 3/16 at 16:17 pm  Pt. Currently on Morphine gtt at 15 mg/ hr and looks comfortable. Ativan is ordered prn for anxiety or seizure. RR is normal, lungs sound junky, robinul is on board.  Liquifilm tears are ordered.   Stroke team is taking lead on comfort care. Palliative care is following. Critical Care will sign off for now, please let us know if we can be of any additional help.   I spent 15 minutes dedicated to the care of this patient on the date of this encounter to include pre-visit review of records, face-to-face time with the patient discussing conditions above, post visit ordering of testing, clinical documentation with the electronic health record, making appropriate referrals as documented, and communicating necessary information to the patient's healthcare team.   Bevelyn Ngo, MSN, AGACNP-BC [facility] Pulmonary/Critical Care Medicine See Amion for personal pager PCCM on call pager [phone]  , 9:53 AM

## 2023-07-15 DEATH — deceased

## 2023-08-18 ENCOUNTER — Other Ambulatory Visit: Payer: Self-pay

## 2023-08-22 ENCOUNTER — Ambulatory Visit: Payer: Self-pay | Admitting: Urology

## 2024-01-14 ENCOUNTER — Ambulatory Visit: Payer: 59 | Admitting: Neurology
